# Patient Record
Sex: Male | Born: 1955 | Race: Black or African American | Hispanic: No | Smoking: Current every day smoker
Health system: Southern US, Community
[De-identification: ages and names within clinical notes are randomized; demographics above are authoritative.]

## PROBLEM LIST (undated history)

## (undated) DIAGNOSIS — K219 Gastro-esophageal reflux disease without esophagitis: Secondary | ICD-10-CM

## (undated) DIAGNOSIS — I509 Heart failure, unspecified: Secondary | ICD-10-CM

## (undated) DIAGNOSIS — J811 Chronic pulmonary edema: Secondary | ICD-10-CM

## (undated) DIAGNOSIS — I10 Essential (primary) hypertension: Secondary | ICD-10-CM

## (undated) DIAGNOSIS — G629 Polyneuropathy, unspecified: Secondary | ICD-10-CM

## (undated) DIAGNOSIS — K759 Inflammatory liver disease, unspecified: Secondary | ICD-10-CM

## (undated) DIAGNOSIS — Z992 Dependence on renal dialysis: Secondary | ICD-10-CM

## (undated) DIAGNOSIS — H269 Unspecified cataract: Secondary | ICD-10-CM

## (undated) DIAGNOSIS — I219 Acute myocardial infarction, unspecified: Secondary | ICD-10-CM

## (undated) DIAGNOSIS — N186 End stage renal disease: Secondary | ICD-10-CM

## (undated) DIAGNOSIS — K859 Acute pancreatitis without necrosis or infection, unspecified: Secondary | ICD-10-CM

## (undated) HISTORY — PX: DIALYSIS FISTULA CREATION: SHX611

## (undated) HISTORY — PX: COLONOSCOPY: SHX174

## (undated) HISTORY — PX: CHOLECYSTECTOMY: SHX55

---

## 2002-07-20 ENCOUNTER — Encounter: Payer: Self-pay | Admitting: Emergency Medicine

## 2002-07-20 ENCOUNTER — Inpatient Hospital Stay (HOSPITAL_COMMUNITY): Admission: EM | Admit: 2002-07-20 | Discharge: 2002-07-21 | Payer: Self-pay | Admitting: Emergency Medicine

## 2002-11-23 ENCOUNTER — Encounter: Payer: Self-pay | Admitting: Emergency Medicine

## 2002-11-23 ENCOUNTER — Emergency Department (HOSPITAL_COMMUNITY): Admission: EM | Admit: 2002-11-23 | Discharge: 2002-11-23 | Payer: Self-pay | Admitting: Emergency Medicine

## 2002-12-31 ENCOUNTER — Emergency Department (HOSPITAL_COMMUNITY): Admission: EM | Admit: 2002-12-31 | Discharge: 2002-12-31 | Payer: Self-pay | Admitting: *Deleted

## 2002-12-31 ENCOUNTER — Encounter: Payer: Self-pay | Admitting: Emergency Medicine

## 2003-03-02 ENCOUNTER — Emergency Department (HOSPITAL_COMMUNITY): Admission: EM | Admit: 2003-03-02 | Discharge: 2003-03-02 | Payer: Self-pay | Admitting: Emergency Medicine

## 2003-03-19 ENCOUNTER — Emergency Department (HOSPITAL_COMMUNITY): Admission: EM | Admit: 2003-03-19 | Discharge: 2003-03-20 | Payer: Self-pay | Admitting: Emergency Medicine

## 2003-03-21 ENCOUNTER — Emergency Department (HOSPITAL_COMMUNITY): Admission: EM | Admit: 2003-03-21 | Discharge: 2003-03-21 | Payer: Self-pay | Admitting: Emergency Medicine

## 2003-04-04 ENCOUNTER — Inpatient Hospital Stay (HOSPITAL_COMMUNITY): Admission: EM | Admit: 2003-04-04 | Discharge: 2003-04-07 | Payer: Self-pay | Admitting: Emergency Medicine

## 2003-04-06 ENCOUNTER — Encounter (INDEPENDENT_AMBULATORY_CARE_PROVIDER_SITE_OTHER): Payer: Self-pay | Admitting: Cardiology

## 2003-08-22 ENCOUNTER — Emergency Department (HOSPITAL_COMMUNITY): Admission: EM | Admit: 2003-08-22 | Discharge: 2003-08-22 | Payer: Self-pay | Admitting: Emergency Medicine

## 2003-11-26 ENCOUNTER — Emergency Department (HOSPITAL_COMMUNITY): Admission: EM | Admit: 2003-11-26 | Discharge: 2003-11-26 | Payer: Self-pay | Admitting: Emergency Medicine

## 2003-12-31 ENCOUNTER — Inpatient Hospital Stay (HOSPITAL_COMMUNITY): Admission: AD | Admit: 2003-12-31 | Discharge: 2004-01-05 | Payer: Self-pay | Admitting: Psychiatry

## 2004-08-12 ENCOUNTER — Emergency Department (HOSPITAL_COMMUNITY): Admission: EM | Admit: 2004-08-12 | Discharge: 2004-08-12 | Payer: Self-pay | Admitting: Emergency Medicine

## 2004-09-29 ENCOUNTER — Emergency Department (HOSPITAL_COMMUNITY): Admission: EM | Admit: 2004-09-29 | Discharge: 2004-09-30 | Payer: Self-pay | Admitting: Emergency Medicine

## 2004-10-04 ENCOUNTER — Emergency Department (HOSPITAL_COMMUNITY): Admission: EM | Admit: 2004-10-04 | Discharge: 2004-10-04 | Payer: Self-pay | Admitting: Emergency Medicine

## 2005-03-22 ENCOUNTER — Emergency Department (HOSPITAL_COMMUNITY): Admission: EM | Admit: 2005-03-22 | Discharge: 2005-03-22 | Payer: Self-pay | Admitting: Emergency Medicine

## 2005-04-11 ENCOUNTER — Emergency Department (HOSPITAL_COMMUNITY): Admission: EM | Admit: 2005-04-11 | Discharge: 2005-04-12 | Payer: Self-pay | Admitting: Emergency Medicine

## 2005-09-08 ENCOUNTER — Emergency Department (HOSPITAL_COMMUNITY): Admission: EM | Admit: 2005-09-08 | Discharge: 2005-09-08 | Payer: Self-pay | Admitting: *Deleted

## 2005-09-23 ENCOUNTER — Ambulatory Visit: Payer: Self-pay | Admitting: Cardiology

## 2005-09-23 ENCOUNTER — Ambulatory Visit: Payer: Self-pay | Admitting: Hospitalist

## 2005-09-23 ENCOUNTER — Inpatient Hospital Stay (HOSPITAL_COMMUNITY): Admission: EM | Admit: 2005-09-23 | Discharge: 2005-09-28 | Payer: Self-pay | Admitting: Emergency Medicine

## 2005-09-24 ENCOUNTER — Encounter: Payer: Self-pay | Admitting: Cardiology

## 2005-10-30 ENCOUNTER — Encounter (HOSPITAL_COMMUNITY): Admission: RE | Admit: 2005-10-30 | Discharge: 2005-11-13 | Payer: Self-pay | Admitting: Nephrology

## 2005-11-03 ENCOUNTER — Emergency Department (HOSPITAL_COMMUNITY): Admission: EM | Admit: 2005-11-03 | Discharge: 2005-11-03 | Payer: Self-pay | Admitting: Emergency Medicine

## 2005-11-03 ENCOUNTER — Ambulatory Visit: Payer: Self-pay | Admitting: Internal Medicine

## 2005-11-17 ENCOUNTER — Ambulatory Visit (HOSPITAL_COMMUNITY): Admission: RE | Admit: 2005-11-17 | Discharge: 2005-11-17 | Payer: Self-pay | Admitting: Vascular Surgery

## 2005-11-29 ENCOUNTER — Emergency Department (HOSPITAL_COMMUNITY): Admission: EM | Admit: 2005-11-29 | Discharge: 2005-11-30 | Payer: Self-pay | Admitting: Emergency Medicine

## 2005-12-22 ENCOUNTER — Encounter: Payer: Self-pay | Admitting: Internal Medicine

## 2005-12-22 ENCOUNTER — Ambulatory Visit: Payer: Self-pay | Admitting: Internal Medicine

## 2005-12-22 ENCOUNTER — Inpatient Hospital Stay (HOSPITAL_COMMUNITY): Admission: EM | Admit: 2005-12-22 | Discharge: 2005-12-27 | Payer: Self-pay | Admitting: *Deleted

## 2006-03-06 ENCOUNTER — Inpatient Hospital Stay (HOSPITAL_COMMUNITY): Admission: EM | Admit: 2006-03-06 | Discharge: 2006-03-14 | Payer: Self-pay | Admitting: Emergency Medicine

## 2006-03-06 ENCOUNTER — Ambulatory Visit: Payer: Self-pay | Admitting: Cardiology

## 2006-05-01 ENCOUNTER — Observation Stay (HOSPITAL_COMMUNITY): Admission: EM | Admit: 2006-05-01 | Discharge: 2006-05-01 | Payer: Self-pay | Admitting: Emergency Medicine

## 2006-05-16 ENCOUNTER — Emergency Department (HOSPITAL_COMMUNITY): Admission: EM | Admit: 2006-05-16 | Discharge: 2006-05-16 | Payer: Self-pay | Admitting: Emergency Medicine

## 2006-05-23 ENCOUNTER — Inpatient Hospital Stay (HOSPITAL_COMMUNITY): Admission: EM | Admit: 2006-05-23 | Discharge: 2006-05-25 | Payer: Self-pay | Admitting: Emergency Medicine

## 2006-06-28 ENCOUNTER — Encounter: Admission: RE | Admit: 2006-06-28 | Discharge: 2006-06-28 | Payer: Self-pay | Admitting: Nephrology

## 2006-06-29 ENCOUNTER — Emergency Department (HOSPITAL_COMMUNITY): Admission: EM | Admit: 2006-06-29 | Discharge: 2006-06-30 | Payer: Self-pay | Admitting: Emergency Medicine

## 2006-08-16 ENCOUNTER — Ambulatory Visit: Payer: Self-pay | Admitting: Internal Medicine

## 2006-08-16 ENCOUNTER — Observation Stay (HOSPITAL_COMMUNITY): Admission: EM | Admit: 2006-08-16 | Discharge: 2006-08-17 | Payer: Self-pay | Admitting: Emergency Medicine

## 2006-08-16 ENCOUNTER — Encounter: Payer: Self-pay | Admitting: Internal Medicine

## 2007-10-03 ENCOUNTER — Emergency Department (HOSPITAL_COMMUNITY): Admission: EM | Admit: 2007-10-03 | Discharge: 2007-10-04 | Payer: Self-pay | Admitting: Emergency Medicine

## 2008-09-20 ENCOUNTER — Emergency Department (HOSPITAL_COMMUNITY): Admission: EM | Admit: 2008-09-20 | Discharge: 2008-09-21 | Payer: Self-pay | Admitting: Emergency Medicine

## 2008-10-04 ENCOUNTER — Emergency Department (HOSPITAL_COMMUNITY): Admission: EM | Admit: 2008-10-04 | Discharge: 2008-10-04 | Payer: Self-pay | Admitting: Emergency Medicine

## 2008-10-21 ENCOUNTER — Inpatient Hospital Stay (HOSPITAL_COMMUNITY): Admission: EM | Admit: 2008-10-21 | Discharge: 2008-10-23 | Payer: Self-pay | Admitting: Emergency Medicine

## 2009-07-15 ENCOUNTER — Emergency Department (HOSPITAL_COMMUNITY): Admission: EM | Admit: 2009-07-15 | Discharge: 2009-07-15 | Payer: Self-pay | Admitting: Emergency Medicine

## 2009-10-24 ENCOUNTER — Inpatient Hospital Stay (HOSPITAL_COMMUNITY): Admission: EM | Admit: 2009-10-24 | Discharge: 2009-10-25 | Payer: Self-pay | Admitting: Emergency Medicine

## 2010-02-09 ENCOUNTER — Inpatient Hospital Stay (HOSPITAL_COMMUNITY): Admission: EM | Admit: 2010-02-09 | Discharge: 2010-02-10 | Payer: Self-pay | Admitting: Emergency Medicine

## 2010-02-14 ENCOUNTER — Emergency Department (HOSPITAL_COMMUNITY): Admission: EM | Admit: 2010-02-14 | Discharge: 2010-02-14 | Payer: Self-pay | Admitting: Emergency Medicine

## 2010-06-05 ENCOUNTER — Encounter: Payer: Self-pay | Admitting: Nephrology

## 2010-06-06 ENCOUNTER — Encounter: Payer: Self-pay | Admitting: Nephrology

## 2010-07-27 LAB — DIFFERENTIAL
Basophils Absolute: 0.1 10*3/uL (ref 0.0–0.1)
Eosinophils Relative: 9 % — ABNORMAL HIGH (ref 0–5)
Lymphocytes Relative: 10 % — ABNORMAL LOW (ref 12–46)
Lymphs Abs: 1.3 10*3/uL (ref 0.7–4.0)
Monocytes Absolute: 1.2 10*3/uL — ABNORMAL HIGH (ref 0.1–1.0)
Neutro Abs: 8.4 10*3/uL — ABNORMAL HIGH (ref 1.7–7.7)

## 2010-07-27 LAB — BASIC METABOLIC PANEL
CO2: 25 mEq/L (ref 19–32)
Creatinine, Ser: 14.05 mg/dL — ABNORMAL HIGH (ref 0.4–1.5)
GFR calc Af Amer: 4 mL/min — ABNORMAL LOW (ref >60)
GFR calc non Af Amer: 4 mL/min — ABNORMAL LOW (ref >60)
Glucose, Bld: 106 mg/dL — ABNORMAL HIGH (ref 70–99)
Potassium: 5 mEq/L (ref 3.5–5.1)

## 2010-07-27 LAB — CBC
HCT: 29.9 % — ABNORMAL LOW (ref 39.0–52.0)
MCV: 86.9 fL (ref 78.0–100.0)
RBC: 3.44 MIL/uL — ABNORMAL LOW (ref 4.22–5.81)
WBC: 12.1 10*3/uL — ABNORMAL HIGH (ref 4.0–10.5)

## 2010-07-28 LAB — BASIC METABOLIC PANEL
BUN: 70 mg/dL — ABNORMAL HIGH (ref 6–23)
CO2: 25 mEq/L (ref 19–32)
Chloride: 100 mEq/L (ref 96–112)
Chloride: 97 mEq/L (ref 96–112)
GFR calc Af Amer: 4 mL/min — ABNORMAL LOW (ref >60)
GFR calc Af Amer: 5 mL/min — ABNORMAL LOW (ref >60)
GFR calc non Af Amer: 4 mL/min — ABNORMAL LOW (ref >60)
Potassium: 4.4 mEq/L (ref 3.5–5.1)
Potassium: 4.4 mEq/L (ref 3.5–5.1)
Sodium: 137 mEq/L (ref 135–145)

## 2010-07-28 LAB — RAPID URINE DRUG SCREEN, HOSP PERFORMED
Barbiturates: NOT DETECTED
Benzodiazepines: NOT DETECTED

## 2010-07-28 LAB — URINALYSIS, ROUTINE W REFLEX MICROSCOPIC
Bilirubin Urine: NEGATIVE
Glucose, UA: 100 mg/dL — AB
Ketones, ur: NEGATIVE mg/dL
Leukocytes, UA: NEGATIVE
Nitrite: NEGATIVE
Specific Gravity, Urine: 1.013 (ref 1.005–1.030)
pH: 8.5 — ABNORMAL HIGH (ref 5.0–8.0)

## 2010-07-28 LAB — CBC
HCT: 33.3 % — ABNORMAL LOW (ref 39.0–52.0)
HCT: 33.9 % — ABNORMAL LOW (ref 39.0–52.0)
Hemoglobin: 11.4 g/dL — ABNORMAL LOW (ref 13.0–17.0)
MCH: 29.6 pg (ref 26.0–34.0)
MCV: 86.5 fL (ref 78.0–100.0)
Platelets: 103 10*3/uL — ABNORMAL LOW (ref 150–400)
RBC: 3.85 MIL/uL — ABNORMAL LOW (ref 4.22–5.81)
RBC: 3.96 MIL/uL — ABNORMAL LOW (ref 4.22–5.81)
RDW: 15.7 % — ABNORMAL HIGH (ref 11.5–15.5)
WBC: 10.6 10*3/uL — ABNORMAL HIGH (ref 4.0–10.5)
WBC: 10.7 10*3/uL — ABNORMAL HIGH (ref 4.0–10.5)

## 2010-07-28 LAB — URINE MICROSCOPIC-ADD ON

## 2010-07-28 LAB — GLUCOSE, CAPILLARY
Glucose-Capillary: 110 mg/dL — ABNORMAL HIGH (ref 70–99)
Glucose-Capillary: 140 mg/dL — ABNORMAL HIGH (ref 70–99)
Glucose-Capillary: 148 mg/dL — ABNORMAL HIGH (ref 70–99)
Glucose-Capillary: 91 mg/dL (ref 70–99)

## 2010-07-28 LAB — DIFFERENTIAL
Eosinophils Absolute: 0.7 10*3/uL (ref 0.0–0.7)
Eosinophils Relative: 6 % — ABNORMAL HIGH (ref 0–5)
Lymphocytes Relative: 8 % — ABNORMAL LOW (ref 12–46)
Lymphs Abs: 0.8 10*3/uL (ref 0.7–4.0)
Monocytes Absolute: 1 10*3/uL (ref 0.1–1.0)
Monocytes Relative: 10 % (ref 3–12)

## 2010-07-28 LAB — ETHANOL: Alcohol, Ethyl (B): <5 mg/dL (ref 0–10)

## 2010-07-28 LAB — PROTIME-INR: Prothrombin Time: 12.6 seconds (ref 11.6–15.2)

## 2010-07-28 LAB — MRSA PCR SCREENING: MRSA by PCR: NEGATIVE

## 2010-08-01 LAB — CBC
MCHC: 33.5 g/dL (ref 30.0–36.0)
MCV: 98.3 fL (ref 78.0–100.0)
Platelets: 127 10*3/uL — ABNORMAL LOW (ref 150–400)

## 2010-08-01 LAB — DIFFERENTIAL
Basophils Relative: 2 % — ABNORMAL HIGH (ref 0–1)
Eosinophils Absolute: 0.4 10*3/uL (ref 0.0–0.7)
Neutrophils Relative %: 66 % (ref 43–77)

## 2010-08-01 LAB — COMPREHENSIVE METABOLIC PANEL
AST: 22 U/L (ref 0–37)
Albumin: 3 g/dL — ABNORMAL LOW (ref 3.5–5.2)
Calcium: 10.1 mg/dL (ref 8.4–10.5)
Creatinine, Ser: 11.09 mg/dL — ABNORMAL HIGH (ref 0.4–1.5)
GFR calc Af Amer: 6 mL/min — ABNORMAL LOW (ref >60)
Total Protein: 7.2 g/dL (ref 6.0–8.3)

## 2010-08-01 LAB — LIPASE, BLOOD: Lipase: 48 U/L (ref 11–59)

## 2010-08-22 LAB — CBC
HCT: 28.5 % — ABNORMAL LOW (ref 39.0–52.0)
Hemoglobin: 9.7 g/dL — ABNORMAL LOW (ref 13.0–17.0)
Hemoglobin: 9.7 g/dL — ABNORMAL LOW (ref 13.0–17.0)
MCHC: 34 g/dL (ref 30.0–36.0)
MCHC: 34.3 g/dL (ref 30.0–36.0)
MCV: 94.1 fL (ref 78.0–100.0)
MCV: 95.2 fL (ref 78.0–100.0)
MCV: 96.6 fL (ref 78.0–100.0)
Platelets: 140 10*3/uL — ABNORMAL LOW (ref 150–400)
Platelets: 187 10*3/uL (ref 150–400)
RBC: 2.96 MIL/uL — ABNORMAL LOW (ref 4.22–5.81)
RBC: 3 MIL/uL — ABNORMAL LOW (ref 4.22–5.81)
RDW: 15.2 % (ref 11.5–15.5)
WBC: 6.8 10*3/uL (ref 4.0–10.5)
WBC: 6.9 10*3/uL (ref 4.0–10.5)

## 2010-08-22 LAB — POCT I-STAT 3, ART BLOOD GAS (G3+)
Acid-Base Excess: 3 mmol/L — ABNORMAL HIGH (ref 0.0–2.0)
Bicarbonate: 27.1 mEq/L — ABNORMAL HIGH (ref 20.0–24.0)
O2 Saturation: 93 %
Patient temperature: 97.6
TCO2: 28 mmol/L (ref 0–100)
pCO2 arterial: 38.3 mmHg (ref 35.0–45.0)
pH, Arterial: 7.457 — ABNORMAL HIGH (ref 7.350–7.450)
pO2, Arterial: 60 mmHg — ABNORMAL LOW (ref 80.0–100.0)

## 2010-08-22 LAB — GLUCOSE, CAPILLARY
Glucose-Capillary: 101 mg/dL — ABNORMAL HIGH (ref 70–99)
Glucose-Capillary: 102 mg/dL — ABNORMAL HIGH (ref 70–99)
Glucose-Capillary: 103 mg/dL — ABNORMAL HIGH (ref 70–99)
Glucose-Capillary: 125 mg/dL — ABNORMAL HIGH (ref 70–99)
Glucose-Capillary: 43 mg/dL — ABNORMAL LOW (ref 70–99)
Glucose-Capillary: 59 mg/dL — ABNORMAL LOW (ref 70–99)
Glucose-Capillary: 72 mg/dL (ref 70–99)
Glucose-Capillary: 75 mg/dL (ref 70–99)
Glucose-Capillary: 77 mg/dL (ref 70–99)
Glucose-Capillary: 87 mg/dL (ref 70–99)
Glucose-Capillary: 95 mg/dL (ref 70–99)

## 2010-08-22 LAB — PROTIME-INR
INR: 1 (ref 0.00–1.49)
Prothrombin Time: 13.6 seconds (ref 11.6–15.2)

## 2010-08-22 LAB — RENAL FUNCTION PANEL
Albumin: 2.8 g/dL — ABNORMAL LOW (ref 3.5–5.2)
BUN: 52 mg/dL — ABNORMAL HIGH (ref 6–23)
Calcium: 8.5 mg/dL (ref 8.4–10.5)
Calcium: 8.6 mg/dL (ref 8.4–10.5)
Creatinine, Ser: 4.06 mg/dL — ABNORMAL HIGH (ref 0.4–1.5)
Creatinine, Ser: 8.67 mg/dL — ABNORMAL HIGH (ref 0.4–1.5)
GFR calc Af Amer: 19 mL/min — ABNORMAL LOW (ref >60)
GFR calc non Af Amer: 16 mL/min — ABNORMAL LOW (ref >60)
Glucose, Bld: 125 mg/dL — ABNORMAL HIGH (ref 70–99)
Phosphorus: 2.1 mg/dL — ABNORMAL LOW (ref 2.3–4.6)
Phosphorus: 2.8 mg/dL (ref 2.3–4.6)
Potassium: 3.9 mEq/L (ref 3.5–5.1)
Sodium: 137 mEq/L (ref 135–145)

## 2010-08-22 LAB — POCT CARDIAC MARKERS
CKMB, poc: 1.5 ng/mL (ref 1.0–8.0)
Myoglobin, poc: 366 ng/mL (ref 12–200)
Troponin i, poc: <0.05 ng/mL (ref 0.00–0.09)

## 2010-08-22 LAB — BASIC METABOLIC PANEL WITH GFR
CO2: 29 meq/L (ref 19–32)
Calcium: 8.4 mg/dL (ref 8.4–10.5)
GFR calc Af Amer: 11 mL/min — ABNORMAL LOW (ref >60)
Potassium: 3.6 meq/L (ref 3.5–5.1)
Sodium: 135 meq/L (ref 135–145)

## 2010-08-22 LAB — BASIC METABOLIC PANEL
BUN: 30 mg/dL — ABNORMAL HIGH (ref 6–23)
BUN: 34 mg/dL — ABNORMAL HIGH (ref 6–23)
Chloride: 100 mEq/L (ref 96–112)
Chloride: 98 mEq/L (ref 96–112)
Creatinine, Ser: 6.25 mg/dL — ABNORMAL HIGH (ref 0.4–1.5)
Creatinine, Ser: 8.1 mg/dL — ABNORMAL HIGH (ref 0.4–1.5)
GFR calc non Af Amer: 7 mL/min — ABNORMAL LOW (ref >60)
GFR calc non Af Amer: 9 mL/min — ABNORMAL LOW (ref >60)
Glucose, Bld: 55 mg/dL — ABNORMAL LOW (ref 70–99)

## 2010-08-22 LAB — DRUG SCREEN PANEL (SERUM)
Amphetamine Scrn: NEGATIVE
Barbiturate Scrn: NEGATIVE
Cannabinoid, Blood: NEGATIVE
Phencyclidine, Serum: NEGATIVE
Propoxyphene,Serum: NEGATIVE

## 2010-08-22 LAB — DIFFERENTIAL
Basophils Absolute: 0.1 10*3/uL (ref 0.0–0.1)
Eosinophils Absolute: 0.3 10*3/uL (ref 0.0–0.7)
Lymphs Abs: 1.2 10*3/uL (ref 0.7–4.0)
Neutrophils Relative %: 65 % (ref 43–77)

## 2010-08-22 LAB — BRAIN NATRIURETIC PEPTIDE: Pro B Natriuretic peptide (BNP): 2466 pg/mL — ABNORMAL HIGH (ref 0.0–100.0)

## 2010-08-23 LAB — CULTURE, BLOOD (ROUTINE X 2)

## 2010-08-23 LAB — CBC
HCT: 31.4 % — ABNORMAL LOW (ref 39.0–52.0)
Hemoglobin: 10.9 g/dL — ABNORMAL LOW (ref 13.0–17.0)
MCV: 95.6 fL (ref 78.0–100.0)
Platelets: 138 10*3/uL — ABNORMAL LOW (ref 150–400)
RDW: 13.8 % (ref 11.5–15.5)

## 2010-08-23 LAB — DIFFERENTIAL
Basophils Absolute: 0 10*3/uL (ref 0.0–0.1)
Eosinophils Absolute: 0.5 10*3/uL (ref 0.0–0.7)
Eosinophils Relative: 4 % (ref 0–5)
Lymphs Abs: 1.5 10*3/uL (ref 0.7–4.0)
Monocytes Absolute: 1.5 10*3/uL — ABNORMAL HIGH (ref 0.1–1.0)

## 2010-08-23 LAB — POCT I-STAT, CHEM 8
BUN: 49 mg/dL — ABNORMAL HIGH (ref 6–23)
Chloride: 103 mEq/L (ref 96–112)
Creatinine, Ser: 11.3 mg/dL — ABNORMAL HIGH (ref 0.4–1.5)
Sodium: 135 mEq/L (ref 135–145)
TCO2: 26 mmol/L (ref 0–100)

## 2010-09-27 NOTE — H&P (Signed)
[name], [name]              ACCOUNT NO.:  [id number]   MEDICAL RECORD NO.:  [id number]          PATIENT TYPE:  INP   LOCATION:  6732                         FACILITY:  [facility]   PHYSICIAN:  Melissa L. Ladona Ridgel, MD  DATE OF BIRTH:  1955-07-12   DATE OF ADMISSION:  10/21/2008  DATE OF DISCHARGE:                              HISTORY & PHYSICAL   CHIEF COMPLAINT:  I can't breathe yet.   PRIMARY CARE PHYSICIAN:  Dr. Briant Cedar.   HISTORY OF PRESENT ILLNESS:  This is a 55 year old African American male  who for the past 2 weeks has been having increasing shortness of breath,  not able to lay flat.  He has been sitting up to sleep.  He has had  dialysis and has had no relief of his symptoms.  The patient stopped  smoking approximately 2 weeks ago, and he is now puzzled by the fact  that he cannot breathe.  The patient complains of chest pain with cough  with deep inspiration.  The patient complains of diarrhea for a couple  times, now improved and he does make some urine.  He has had no fever or  chills.  He had some black stool a couple weeks ago, but that has  improved.   REVIEW OF SYSTEMS:  CONSTITUTIONAL:  He stated his weight has been the  same.  He has had no fever or chills.  EYES:  He has had a little bit of blurry vision, but no double vision.  EARS/NOSE/THROAT:  No tinnitus, dysphagia or discharge.  CARDIOVASCULAR:  Intermittent chest pain which is not present at this  time.  RESPIRATORY:  Positive shortness of breath with PND and orthopnea.  GI:  He reports the possibility of some black stools in the past.  GU:  No hesitancy, frequency or dysuria.  He does make urine.  He is  currently a dialysis patient for the last 3 years.  NEUROLOGIC:  He has no headaches and no seizure disorder.  HEMATOLOGIC:  No bleeding disorder.  INTEGUMENTARY:  No rashes.  PSYCHIATRIC:  No depression or anxiety.   PAST MEDICAL HISTORY:  Renal disease, hypertension and a dialysis  patient.  He  is diabetic.   PAST SURGICAL HISTORY:  AV fistula.   SOCIAL HISTORY:  He quit tobacco 2 weeks ago.  He does not drink any  alcohol.  His contact person will be his mother, Para March at [phone].  Please note that the patient's mom is also a dialysis patient.   ALLERGIES:  NO KNOWN DRUG ALLERGIES.   CURRENT MEDICATIONS:  1. Coreg 25 mg twice daily.  2. Lisinopril 40 mg twice daily.  3. Glyburide 5 mg once daily.  4. Lisinopril 20 mg at bedtime.   FAMILY HISTORY:  Mom has end-stage renal disease and goes to the same  dialysis center that he does.  Dad is living with good health.   PHYSICAL EXAMINATION:  VITAL SIGNS:  Temperature is 97.9, blood pressure  179/86, pulse 61, respirations 16.  Saturation 98% on 2 liters.  GENERAL:  A pleasant African American male who is unable to lay  flat  secondary to sensation of dyspnea.  He has no acute distress or use of  accessory muscles in the upright position.  HEENT:  He is normocephalic, atraumatic.  Pupils equal, round and  reactive to light.  He has anicteric sclerae.  Extraocular muscles  appear to be intact.  Examination of the ears reveal tympanic membranes  to be intact bilaterally.  There is no external lesions, no discharge.  Examination of the nose; septum midline without any discharge.  Turbinates are intact.  Examination of the mouth; he has dentures with  no oral lesions or lip lesions.  NECK:  Supple.  There is no JVD, no lymph nodes, no carotid bruits.  CHEST:  Clear to auscultation.  There is no rhonchi, rales or wheezes.  CARDIOVASCULAR:  Regular rate and rhythm.  Positive S1-S2.  No S3-S4.  No murmurs, rubs or gallops.  ABDOMEN:  Obese, nontender, nondistended with positive bowel sounds.  The patient has no hepatosplenomegaly, no guarding, no rebound.  EXTREMITIES:  The left arm has a AV fistula.  There is no clubbing,  cyanosis or edema.  His fistula has a palpable thrill.  NEUROLOGIC:  He is tired, but awake, alert and  oriented.  Cranial nerves  II-XII are intact.  Power appears to be 4+/5 symmetrically.  DTRs are  2+.  Toes are downgoing.  PSYCHIATRIC:  Affect is appropriate.  Recent and remote memory is  intact.  Judgment is intact.   PERTINENT LABORATORIES:  His white count is 6.8 with a hemoglobin of  9.7, hematocrit 29.2 and platelets of 187.  His first set of cardiac  markers is negative for a troponin less than 0.05, but his myoglobin is  slightly elevated at 366.  Sodium is 138, potassium 4.0, chloride 100,  CO2 is 30, glucose is 100, BUN 34, creatinine is 8.1, calcium is 8.9.  PT is 13.6 with an INR of 1.0, PTT 33.  BNP was 2466.  Chest x-ray shows  cardiac enlargement, bilateral perihilar infiltrates, right greater than  left with a question of edema versus infection.  There is no pleural  effusion or pneumothorax.  He has mild fluid or thickening in the  fissure with no bony focal abnormalities.   EKG shows sinus rhythm with no ST-T wave changes.   Generally, this is a pleasant African American male unable to lay flat  secondary to sensation of dyspnea.  He is in no acute distress in the  upright position, but he does have PND and orthopnea for at least 2  weeks.  Chest x-ray shows a unilateral area of possible infiltrate  versus CHF.   1. Dyspnea, PND, orthopnea, bilateral infiltrates with right greater      than left, perihilar opacification, possibly representing a focal      congestive heart failure versus an infiltrate.  For now, we will      plan to dialyze the patient today in order to improve his      congestive heart failure type symptoms.  I will treat him with      Avelox IV once daily and I will schedule him for a CTA after      dialysis to look for PE.  2. Hypertension.  We will continue his Coreg b.i.d., Aleve p.r.n. and      labetalol.  We will continue his lisinopril 40 b.i.d. and 20 at      bedtime.  3. Diabetes.  We will continue his glyburide and sliding scale  insulin.  4. Pulmonary infiltrates versus CHF.  As stated, I will start him on      Avelox.  We will obtain a CT scan after      dialysis and I will treat him for PE.  Lovenox per pharmacy will be      requested.  5. End-stage renal disease.  The patient will be for dialysis today.      I confirmed this with Dr. Arrie Aran.   Total time on this case was from 6:15 a.m. to 8:30 a.m.      Melissa L. Ladona Ridgel, MD  Electronically Signed     MLT/MEDQ  D:  10/21/2008  T:  10/21/2008  Job:  [id number]   cc:   Dyke Maes, M.D.

## 2010-09-27 NOTE — Discharge Summary (Signed)
NAMEBRYN, [name]              ACCOUNT NO.:  [id number]   MEDICAL RECORD NO.:  [id number]          PATIENT TYPE:  INP   LOCATION:  6732                         FACILITY:  [facility]   PHYSICIAN:  Theodosia Paling, MD    DATE OF BIRTH:  1956-01-22   DATE OF ADMISSION:  10/20/2008  DATE OF DISCHARGE:  10/23/2008                               DISCHARGE SUMMARY   PRIMARY CARE PHYSICIAN:  Dyke Maes, MD   ADMITTING HISTORY:  Please refer to the excellent admission note  dictated by Dr. Derenda Mis under history of present illness.   DISCHARGE DIAGNOSIS:  Shortness of breath most likely due to volume  overload.   SECONDARY DIAGNOSES:  1. History of end-stage renal disease.  2. History of hypertension.  3. History of diabetes mellitus.   DISCHARGE MEDICATIONS:  1. New medication added hydralazine 50 mg p.o. q.8 h.  2. Azithromycin 500 mg p.o. daily for 3 days.   The patient is instructed to continue home medications which include:  1. Aspirin enteric-coated 81 mg p.o. daily.  2. Amlodipine 10 mg p.o. daily.  3. Coreg 25 mg p.o. q.12 h.  4. Lisinopril 40 mg p.o. q.12 h.  5. Glyburide 0.5 mg p.o. daily.   HOSPITAL COURSE:  Following issues were addressed during the  hospitalization.  1. Shortness of breath.  The patient was primarily admitted with      shortness of breath.  His chest x-ray was consistent with volume      overload.  His symptoms significantly got better after getting      hemodialysis.  He does have persistent infiltrate on his chest x-      ray, however, I think we got more radiological delay of resolving      pneumonia than actually pneumonia.  The patient has already gotten      antibiotics in the past.  During the hospitalization, he did get IV      antibiotics.  We are going to discharge the patient with 3 more      days of azithromycin.  He will follow up with Dr. Briant Cedar within      1 week time for further evaluation and management.  2.  End-stage renal dialysis.  Follow up outpatient with Dr. Briant Cedar      for hemodialysis.  3. Hypertension.  I have included hydralazine 50 q.8 h. given his high      blood pressure.  He is going to follow up with Dr. Briant Cedar as an      outpatient.  Management of hypertension can further be carried out.  4. Diabetes mellitus.  The patient will continue his home medication.   CONSULTATIONS:  [facility] on October 21, 2008 for volume  overload.   PROCEDURES PERFORMED:  Hemodialysis twice.   IMAGING PERFORMED:  CTA of the chest showing:  1. Prominent mediastinal and hilar lymph node hyperplastic in the      region.  2. Bilateral upper lobe infiltrate.  3. Small right pleural effusion and overlying atelectasis.   DISPOSITION:  The patient is going home and will follow  up with Dr.  Briant Cedar within 1 week's time for further management of the end-stage  renal disease and management of his hypertension.   TOTAL TIME SPENT:  In discharge of this patient is around 1 hour.      Theodosia Paling, MD  Electronically Signed     NP/MEDQ  D:  10/23/2008  T:  10/24/2008  Job:  [id number]   cc:   Dyke Maes, M.D.
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
## 2010-09-30 NOTE — H&P (Signed)
NAMEGAVIN, [name]              ACCOUNT NO.:  [id number]   MEDICAL RECORD NO.:  [id number]          PATIENT TYPE:  INP   LOCATION:  5501                         FACILITY:  [facility]   PHYSICIAN:  Garnetta Buddy, M.D.   DATE OF BIRTH:  January 03, 1956   DATE OF ADMISSION:  05/23/2006  DATE OF DISCHARGE:                              HISTORY & PHYSICAL   TIME SEEN:  11 p.m.   This is a 55 year old African American male with a three-day history of  chest pain.  He has end-stage renal disease, Tuesday, Thursday, Saturday  at [employer].  His dry weight is set at 77 kilograms, he left 1.7  kilograms above his dry weight with large fluid gains and cramps towards  the end of his treatment.  He presented to the emergency room with  dyspnea on exercise, minimal activity, chest discomfort, chest wall  without radiation, constant dull pressure, right and left lung fields,  relieved by oxygen, worse when lying flat.  No cough with sputum  production.  No sinus congestion or runny nose.  Denies any chills.   PAST MEDICAL HISTORY:  1. Hypertension.  2. End-stage renal disease.  3. Diabetes mellitus type 2.  4. History of hepatitis C and cirrhosis.  5. History of polysubstance abuse.  6. History of depression.   MEDICATIONS:  1. Norvasc 10 mg daily.  2. Tums 2 tablets q.a.c.  3. Aspirin 81 mg daily.   ALLERGIES:  NO KNOWN DRUG ALLERGIES.   REVIEW OF SYSTEMS:  GENERAL:  Denies any fevers, sweats or chills.  Admits to weakness and malaise.  EYES:  No visual loss.  No double  vision or eye pain.  EARS/NOSE/MOUTH/THROAT:  No epistaxis or sore  throat.  CARDIOVASCULAR:  Positive for chest pain.  Positive for  orthopnea.  No syncope.  Last cath October of 2007, negative.  RESPIRATORY SYSTEM:  Positive for dyspnea.  Mild cough, nonproductive.  ABDOMINAL SYSTEM:  No pain, nausea, vomiting or diarrhea.  UROGENITAL:  No dysuria, urgency or frequency.  NEUROLOGIC:  No diplopia, dysarthria  or dysphasia.   No paresthesias.  MUSCULOSKELETAL:  No joint pain, joint  swelling or muscle weakness.  ENDOCRINE:  Positive for diabetes.  Negative for thyroid or adrenal disease.  DERMATOLOGIC:  No skin rashes  or lesions.  HEMATOLOGIC:  Negative for DVT, pulmonary embolus or  bleeding.   SOCIAL HISTORY:  No tobacco.  No alcohol.   FAMILY HISTORY:  Mother and uncle with end-stage renal disease.   PHYSICAL EXAMINATION:  GENERAL:  Alert and pleasant gentleman.  VITAL SIGNS:  Temperature 99.4.  Pulse 85.  Blood pressure 170/80.  HEENT:  Head and eyes:  PERRLA.  Normocephalic, atraumatic.  Ears/Nose/Mouth/Throat:  His tympanic membranes are normal.  Mucous  membranes normal.  Oropharynx is clear.  NECK:  Supple.  JVP mildly elevated.  No bruits.  CARDIOVASCULAR:  Regular rate and rhythm.  Systolic murmur 2/6.  No  heaves, thrills, rubs or gallops.  RESPIRATORY:  Lungs fields have diminished air entry at the bases.  No  crackles or wheezes.  ABDOMEN:  Soft, nontender.  Bowel sounds present.  EXTREMITIES:  No clubbing, cyanosis or edema.  AV fistula thrill and  bruit.  DERMATOLOGIC:  No lesions noted.  NEUROLOGIC:  Alert and oriented.  Nonfocal.   LABS:  Troponin less than 0.05.  Hemoglobin 15.6, WBC 14.6, platelets  222, potassium 3.7, glucose 128.  Chest x-ray pending.  EKG normal sinus  rhythm, inferolateral ischemia, LVH per voltage.   ASSESSMENT/PLAN:  1. End-stage renal disease.  Hemodialysis __________ some dialysis      mild volume excess.  2. Rule out myocardial infarction, we will check troponin x3.  3. Suspicious clinically for pneumonia.  We will cover with Avalox 400      mg IV q.24 hours.  We will rule out pulmonary embolus with chest      CT.  4. Pulse oximetry continuously on oxygen nasal cannula 2 liters.  5. Anemia.  Stable.  6. Hypertension.  Norvasc 10 mg q.h.s.      Garnetta Buddy, M.D.  Electronically Signed     MWW/MEDQ  D:  05/23/2006  T:  05/24/2006  Job:   [id number]

## 2010-09-30 NOTE — Consult Note (Signed)
NAME:  [name], [name]                        ACCOUNT NO.:  [id number]   MEDICAL RECORD NO.:  [id number]                   PATIENT TYPE:  IPS   LOCATION:  0505                                 FACILITY:  [facility]   PHYSICIAN:  Sherin Quarry, MD                   DATE OF BIRTH:  1955/05/26   DATE OF CONSULTATION:  01/04/2004  DATE OF DISCHARGE:                                   CONSULTATION   HISTORY OF PRESENT ILLNESS:  [name] is a 55 year old homeless man  who has been admitted to [facility] on several occasions for  problems related to alcohol abuse and crack cocaine intoxication.  His most  recent hospitalization was in November of last year when he presented with  diffuse swelling which was found to be secondary to anasarca from cirrhosis  of the liver.  The patient states that he has a history of diabetes that  dates to 16.  He states that his usual treatment for diabetes consists of  NPH insulin 40 units each a.m. and 20 units each p.m.  He states that he was  taking insulin until 2 months ago when he ran out of medications and has not  taken any treatment for his diabetes since that time.  Review of the  hospital transcriptions suggests that at least until recently he was treated  with oral hypoglycemic agents.  He initially presented on August 18 with  complaints of leg swelling and reported suicidal ideation.  He was therefore  admitted involuntarily to [facility].  Since his admission he has  been maintained on Glipizide 5 mg daily and hydrochlorothiazide 25 mg daily,  as well as Symmetrel, Cymbalta, Librium, Seroquel, and Deseryl.  Blood  sugars have been in the range of 350 to 500.  We are asked to see him to  provide advice for regulating his blood sugar.   PAST MEDICAL HISTORY:  Medical illnesses:  1. 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  2. Alcohol abuse.  3. Diabetes.  4. Tobacco abuse.  5. History of crack cocaine abuse and IV  drug abuse.   MEDICATIONS:  These have probably consisted of insulin as described above,  Aldactone 50 mg b.i.d., and Lasix 40 mg daily, although it seems likely that  he has not taken any medication for the last 2 months.   </OPERATIONS>  He reports no operations.   FAMILY HISTORY:  Both his mother and uncle are receiving dialysis  treatments.  His mother and brother have diabetes.   SOCIAL HISTORY:  He smokes heavily, has a history of alcohol abuse, abuses  crack cocaine and IV cocaine.  He is currently homeless.   REVIEW OF SYSTEMS:  HEAD:  He denies headache or dizziness.  EYES:  He  denies visual blurring.  EARS, NOSE AND THROAT:  He denies earache, sinus  pain, or sore throat.  CHEST:  He has a mild cough.  CARDIOVASCULAR:  He  denies orthopnea, PND, or ankle edema.  GI:  Denies nausea and vomiting or  abdominal pain.  GU:  Denies dysuria or urinary frequency.  NEURO:  Denies  seizure or stroke.  ENDO:  Denies excessive thirst, urinary frequency or  nocturia.   PHYSICAL EXAMINATION:  On physical examination, he is a thin, alert man who  is reasonably cooperative.  HEENT:  Within normal limits.  CHEST:  Clear.  Examination of the back reveals no CVA or point tenderness.  CARDIOVASCULAR:  Normal S1, S2.  There are no rubs, murmurs or gallops.  ABDOMEN:  Soft, it is not distended.  There are no masses or organomegaly.  NEUROLOGIC:  Testing.  EXTREMITIES:  Normal.   IMPRESSION:  1. Uncontrolled diabetes.  2. History of anasarca secondary to cirrhosis of the liver.  3. Chronic alcohol abuse.  4. Chronic tobacco abuse.  5. Chronic cocaine abuse.   PLAN:  I think this patient needs to be on both short acting and long acting  insulin in order to achieve regulation of his blood sugars.  In addition, it  will be important to achieve better blood pressure control and I think this  should be accomplished by a combination of an ace inhibitor and diuretic.  Aldactone might be a  good choice except it is going to probably cause  problems with hyperkalemia along with ace inhibitor.  Managing this man's  diabetes on a long-term basis is going to be problematic with his inability  to obtain medications.                                               Sherin Quarry, MD    SY/MEDQ  D:  01/04/2004  T:  01/05/2004  Job:  [id number]   cc:   Josiah Lobo, M.D.
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
## 2010-09-30 NOTE — H&P (Signed)
NAMEARLANDER, [name]              ACCOUNT NO.:  [id number]   MEDICAL RECORD NO.:  [id number]          PATIENT TYPE:  INP   LOCATION:  5506                         FACILITY:  [facility]   PHYSICIAN:  Mindi Slicker. Lowell Guitar, M.D.  DATE OF BIRTH:  1955-06-14   DATE OF ADMISSION:  04/30/2006  DATE OF DISCHARGE:                              HISTORY & PHYSICAL   HISTORY OF PRESENT ILLNESS:  [name] is a 55 year old male who  started dialysis in late October after treatment for chronic kidney  disease, volume overload, acute MI and congestive heart failure.  The  patient receives hemodialysis at the [facility] on  Tuesday, Thursday and Saturday.  He presents now with a 3-day history of  chest pain, which has been somewhat positional, and bilateral flank  pain, with chest x-ray today showing evidence of mild pulmonary edema  with BNP of greater than 3000.  The patient is now admitted for  treatment.   PAST HISTORY:  Includes hypertension, diabetes mellitus, history of  polysubstance abuse, hepatitis C with cirrhosis, status post left upper  extremity AV fistula, depression with prior suicidal attempt, history of  left upper extremity AV fistula and anemia.   FAMILY HISTORY AND SOCIAL HISTORY:  As per October admitting notes.   CURRENT MEDICATIONS:  Include glimepiride, Seroquel, Os-Cal, Pravachol,  clonidine, Norvasc, aspirin, pindolol.   PHYSICAL EXAMINATION:  GENERAL:  The patient is an [place]  male, oriented in all spheres.  VITAL SIGNS:  Blood pressure is 203/97, repeat 176/82,  HEENT:  Poor dentition.  LUNGS:  Clear.  HEART:  Regular rhythm and rate.  No pericardial friction rub is  audible.  ABDOMEN:  Soft.  No CVA tenderness.  There is slight discomfort in the  low lumbar area.  EXTREMITIES:  Trace edema.   LABORATORY DATA:  Hemoglobin 13.9, white blood count 11,500, platelets  234,000.  Urinalysis:  21-50 red blood cells.  Glucose was 151.  CK 187.  Potassium 4.4.   EKG:  Normal sinus rhythm with an no anterior MI and left axis  deviation.   ASSESSMENT:  1. Congestive heart failure.  2. Chest pain, most likely musculoskeletal.  3. End-stage renal disease.  4. Flank pain, most likely musculoskeletal.  5. Microhematuria of uncertain etiology.   PLAN:  1. Rule out MI.  2. Hemodialysis in the morning.  3. Urine C&S.  4. May need urologic evaluation.      Mindi Slicker. Lowell Guitar, M.D.  Electronically Signed     ACP/MEDQ  D:  04/30/2006  T:  05/01/2006  Job:  [id number]
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
## 2010-09-30 NOTE — Cardiovascular Report (Signed)
NAMESENICA, CRALL              ACCOUNT NO.:  [id number]   MEDICAL RECORD NO.:  [id number]          PATIENT TYPE:  INP   LOCATION:  4728                         FACILITY:  [facility]   PHYSICIAN:  Arturo Morton. Riley Kill, MD, FACCDATE OF BIRTH:  1955-07-09   DATE OF PROCEDURE:  03/13/2006  DATE OF DISCHARGE:                              CARDIAC CATHETERIZATION   INDICATIONS:  Mr. Henningsen came in with volume overload.  He has advanced  chronic kidney disease with volume overload, and it was thought that he had  a possible non-Q-wave MI with a positive MB but negative troponin.  He had  an AV fistula placed in July 2007 and has a history of hypertension, with  multiple family members on dialysis.  The current study was done to assess  coronary anatomy.   PROCEDURE:  1. Left heart catheterization.  2. Selective coronary arteriography.  3. Selective left ventriculography.   DESCRIPTION OF THE PROCEDURE:  The patient brought to the catheterization  lab and prepped and draped in usual fashion. Through an anterior puncture,  the right femoral artery was easily entered.  A 5-French sheath was placed.  We then took views of the left coronary artery, but there was inadequate  opacification.  The right coronary artery was then injected, and then the 5-  [place] sheath was upgraded to a 6-French sheath, and we used a JL-4 guiding  catheter to better visualize the hypertrophied left coronary system.  Views  of the left coronary were then obtained in multiple angiographic  projections.  Central aortic and left ventricular pressures were measured  with the pigtail, and ventriculography was performed in the RAO projection.  The patient tolerated the procedure well, and there were no complications.  He was taken to the holding area in satisfactory clinical condition.   HEMODYNAMIC DATA.:  1. Central aortic pressure 133/65, mean 92.  2. Left ventricular pressure 133/12.  3. No gradient on pullback across  the aortic valve.   ANGIOGRAPHIC DATA:  1. Ventriculography was done in the RAO projection.  Overall systolic      function appeared to be preserved.  There did not appear to be      significant mitral regurgitation, and the aortic root did not appear to      be significantly enlarged.  2. The right coronary artery is a relatively smooth vessel, with only      minor irregularity.  There was no significant high-grade focal      stenosis.  The right coronary provided a major PDA and a smaller      posterolateral system.  3. The left main coronary artery is free of critical disease.  4. The left anterior descending artery courses to the apex.  There are two      diagonal branches which come off near one another, and a major septal      perforator, all of which appear to be smooth and without critical      narrowing.  The LAD bifurcates at the apex.  5. There is a ramus intermedius vessel that is small and appears to have  perhaps tapered narrowing of about 30% at the ostium, but his does not      appear to be high grade.  6. The circumflex system is moderately large, with a large marginal branch      that bifurcates in two posterolateral branches, both of which are free      of critical disease.   CONCLUSIONS:  1. Systemic hypertension.  2. End-stage renal disease.  3. Presentation with volume overload.  4. Well-preserved left ventricular systolic function.  5. No critical coronary stenosis.   DISPOSITION:  Medical therapy will be warranted.  The major issue at this  time is the patient's need for chronic dialysis.  Cardiac followup will be  with Dr. Antoine Poche.      Arturo Morton. Riley Kill, MD, [facility]  Electronically Signed     TDS/MEDQ  D:  03/13/2006  T:  03/13/2006  Job:  [id number]   cc:   Rollene Rotunda, MD, [facility]  Madolyn Frieze Jens Som, MD, [facility]  CV Laboratory
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
## 2010-09-30 NOTE — Discharge Summary (Signed)
NAME:  [name], [name]                        ACCOUNT NO.:  [id number]   MEDICAL RECORD NO.:  [id number]                   PATIENT TYPE:  INP   LOCATION:  0344                                 [facility]ACILITY[facility]  [facility]   PHYSICIAN:  Corinna L. Lendell Caprice, MD             DATE OF BIRTH:  03-May-1956   DATE OF ADMISSION:  04/04/2003  DATE OF DISCHARGE:  04/07/2003                                 DISCHARGE SUMMARY   DIAGNOSES:  1. Anasarca secondary to cirrhosis.  2. Alcohol abuse.  3. Diabetes.  4. Tobacco abuse.  5. History of crack cocaine abuse and past IV drug abuse.   DISCHARGE MEDICATIONS:  1. Lasix 40 mg p.o. daily.  2. Spironolactone 50 mg p.o. b.i.d.  3. Amaryl 2 mg p.o. daily.   DIET:  Low salt, diabetic.   FOLLOW UP:  Follow-up with [facility].   SPECIAL INSTRUCTIONS:  He was encouraged not to drink or use drugs.   ACTIVITY:  Ad lib.   DISCHARGE CONDITION:  Stable.   HISTORY AND HOSPITAL COURSE:  The patient is a 55 year old polysubstance  abuser who presented to the emergency room with complaints of swelling.  He  has a history of diabetes, but was non-compliant with his medication.  He  had sugars in the 300's and his albumin was only 1.3, presumably due to  liver disease.  His HIV was negative.  The patient was started on diuretics  and diabetes medications.  His anasarca improved tremendously. An  echocardiogram showed a good ejection fraction with mild mitral  regurgitation.  Chest x-ray showed no pneumonia.  CT of the abdomen and  pelvis showed anasarca without any abnormalities other than gallstones.  The  patient was very anxious to go and therefore was discharged to home with  continued outpatient treatment and follow-up at [facility].                                               Corinna L. Lendell Caprice, MD    CLS/MEDQ  D:  04/27/2003  T:  04/27/2003  Job:  [id number]
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
## 2010-09-30 NOTE — Discharge Summary (Signed)
[name], [name]              ACCOUNT NO.:  [id number]   MEDICAL RECORD NO.:  [id number]          PATIENT TYPE:  INP   LOCATION:  5501                         FACILITY:  [facility]   PHYSICIAN:  Garnetta Buddy, M.D.   DATE OF BIRTH:  1955-07-26   DATE OF ADMISSION:  05/23/2006  DATE OF DISCHARGE:  05/25/2006                               DISCHARGE SUMMARY   ADMISSION DIAGNOSES:  1. Atypical chest pain, rule out acute myocardial infarction.  2. End stage renal disease on chronic hemodialysis.  3. Uncontrolled hypertension.  4. Low grade fever with leukocytosis.  5. Iron deficiency anemia of chronic disease.  6. Secondary hyperparathyroidism.  7. History of substance abuse.  8. Hepatitis C.   DISCHARGE DIAGNOSIS:  1. Atypical chest pain secondary to early left lower lobe pneumonia      and possibly cocaine abuse with positive drug screen, negative for      acute myocardial infarction.  2. Left lower lobe pneumonia.  3. Ongoing cocaine abuse with positive drug screen.  4. Malignant hypertension.  5. Iron deficiency anemia.  6. Secondary hyperparathyroidism.  7. End stage renal disease on chronic hemodialysis.  8. Hepatitis C.   BRIEF HISTORY:  55 year old black male with a three day history of chest  pain.  He has end stage renal disease secondary to hypertension and type  2 diabetes mellitus, dialyzing every Tuesday, Thursday, and Saturday at  [facility].  He has a history of excessive weight gain and  severe cramping during dialysis.  He presents to the emergency room  complaining of dyspnea on exertion, chest discomfort without radiation,  constant chest pressure in both right and left lung fields, he feels  better with oxygen.  He states his symptoms are worse when he lies flat.  He denies cough, sinus congestion, respiratory infection, fever, or  chills.   LABS ON ADMISSION:  White count 14,600, hemoglobin 15.6, platelets  222,000.  Sodium 133, potassium  3.7, chloride 103, glucose 128, BUN 47,  creatinine 6.99. CK-MB 5, troponin less than 0.05.  Chest x-ray showed  mild bronchiectasias with tiny left pleural effusion, borderline  cardiomegaly, no edema. EKG showed normal sinus rhythm, right atrial  enlargement, left ventricular hypertrophy by voltage, mild inferior ST  depression.   HOSPITAL COURSE:  Serial cardiac enzymes were negative for an acute MI.  The patient was empirically placed on IV Avelox.  Spiral CT of the chest  done to rule out pulmonary embolus was negative for that; however, it  did show cardiomegaly, left ventricular hypertrophy, probable pulmonary  artery hypertension, small left pleural effusion, hepatomegaly, mild  atelectasis, left greater than right base.  His chest pain resolved on  antibiotics and pain medicine.  His uncontrolled hypertension was  treated with his usual Norvasc, Avalide, and labetalol.  He dialyzed one  treatment with 2.5 liters of ultrafiltrate removed only due to  complaints of cramping.  His estimated dry weight as an outpatient is  77.5 kg and it will be lowered 77 kg at the time of discharge.  EPO was  continued at 7500 units  IV each dialysis along with InFeD 100 mg IV and  Hectorol 1.5 mcg IV each dialysis.   Urine drug screen returned positive for cocaine.  It is possible that  his chest pain was attributed to cocaine abuse and resolved while in  hospital.  He has a long history of poly-substance abuse in the past.  This has been addressed with the patient many times.  At the time of  discharge, his white count had dropped to 11,500.  He had been afebrile.  He cannot afford Avelox as an outpatient and he was switched to generic  ciprofloxacin and doxycycline to complete a two week course of  antibiotics.   DISCHARGE MEDICATIONS:  Avalide 150/12.5, 2 pills q.a.m., Sular 40 mg  q.p.m., Duet 30 mg daily, Tums 2 with meals, aspirin 81 mg daily, Cipro  500 mg daily x10 more days,  doxycycline 100 mg b.i.d. x7 days, oxycodone  5 mg one every 4 hours p.r.n. pain.  New dry weight of 77 kg. EPO 7500  units IV each dialysis, Hectorol 1.5 mcg IV each dialysis, InFeD 100 mg  IV weekly.      Zenovia Jordan, P.A.      Garnetta Buddy, M.D.  Electronically Signed    RRK/MEDQ  D:  07/05/2006  T:  07/05/2006  Job:  [id number]

## 2010-09-30 NOTE — Discharge Summary (Signed)
NAMELANSON, RANDLE              ACCOUNT NO.:  [id number]   MEDICAL RECORD NO.:  [id number]          PATIENT TYPE:  INP   LOCATION:  4728                         FACILITY:  [facility]   PHYSICIAN:  Sheamus Hasting. Excell Seltzer, MD  DATE OF BIRTH:  Oct 14, 1955   DATE OF ADMISSION:  03/06/2006  DATE OF DISCHARGE:  03/14/2006                                 DISCHARGE SUMMARY   PROCEDURES:  1. Cardiac catheterization.  2. Coronary arteriogram.  3. Left ventriculogram.   DISCHARGE DIAGNOSES:  1. Congestive heart failure.  2. Systemic hypertension.  3. End-stage renal disease.  4. Preserved left ventricular systolic function.  5. Acute on chronic diastolic congestive heart failure.  6. Hyperparathyroidism.  7. Diabetes.  8. Dyslipidemia.  9. Iron deficiency anemia.  10.History of polysubstance abuse.  11.History of hepatitis C with cirrhosis.  12.Status post left upper extremity AV fistula in July 2007.  13.Proteinuria.  14.History of depression with a previous suicide attempt.  15.Anasarca.   HOSPITAL COURSE:  Mr. Petrosian is a 55 year old male with a previous history  of poly substance abuse including alcohol, tobacco and cocaine as well as  THC.  He was admitted in August 2007, for congestive heart failure and came  back October 23, with increasing edema.  He stated that he had never gotten  prescriptions filled when he was discharged from the hospital the last time  and had quit taking the medications after he ran out of samples provided him  by the hospital.  He was admitted for further evaluation and treatment.   His BUN and creatinine were significantly elevated with a BUN of 61 and  creatinine of 6.2.  A calcium level was low at 6.3.  His blood sugars were  elevated as well.  His iron was low at 27.  Hepatitis B surface antigen was  negative.  Parathyroid hormone was elevated at 248 and calcium for PTH was  low at 6.9.  His troponins were negative, but his CK-MBs were elevated with  a  peak of 402/18.7.  He was anemic with a hemoglobin of 10.5 which decreased  to 8.9 and a hematocrit that decreased to 25.7.   A renal consult was called because of his elevated BUN and creatinine and he  was seen by Dr. Arlean Hopping.  Dr. Arlean Hopping recommended that he go ahead and start  dialysis.  He was dialyzed on October 27, October 29, and March 14, 2006.  It was recommended that his outpatient dialysis be Tuesday, Thursday and  Saturday and this was set up.   His volume status improved with dialysis.  It was felt because of the  elevation in his cardiac enzymes, he needed cardiac catheterization to  further define his anatomy.  This was performed on March 13, 2006.  The  cardiac catheterization showed no critical coronary artery disease.  He had  normal systolic function with no obvious MR.   His blood pressure medications were restarted and uptitrated.  By March 14, 2006, his blood pressure was under much better control.  His chest pain  resolved.  He was seen by social  services and is to follow up with  [facility] as an outpatient for his primary care.  He is also to be  followed by cardiology.  He states that he get samples at times from the  renal doctor and from the mental health doctor.  Where possible, his  prescriptions were changed to [employer] four dollar drugs.   A March 14, 2006, Mr. Dubree was ambulating without chest pain or  shortness of breath.  His hemoglobin was 9.0 with hematocrit of 26.5.  He is  on Aranesp at dialysis and Nephro-Vite on a daily basis.  He is encouraged  to follow a diabetic renal diet and he was started on Amaryl.  He was not  able to get samples of his medications from the hospital because he had  previously received them in August and the hospital will only provide them  once a year.  Mr. Todorov was evaluated by Dr. Excell Seltzer and considered stable  for discharge with outpatient followup arranged.   ACTIVITY:  His activity level is to be  increased gradually.   FOLLOW UP:  He is to keep his dialysis appointment which is March 15, 2006, at 11 a.m.  A message has been left and he is to follow up with Dr.  Antoine Poche as an outpatient.  He is to continue work on obtaining a primary  MD.   DISCHARGE MEDICATIONS:  1. Fluoxetine 20 mg daily.  2. Clonidine 0.1 mg b.i.d.  3. Pravachol 4 mg daily.  4. Aspirin 81 mg and a day.  5. Os-Cal plus D daily.  6. Seroquel 100 mg nightly.  7. Glimepiride 1 mg daily.  8. Norvasc 5 mg on dialysis days.  9. Pindolol 5 mg b.i.d.  10.Dialyvite daily.      Theodore Demark, PA-C      Veverly Fells. Excell Seltzer, MD  Electronically Signed    RB/MEDQ  D:  03/14/2006  T:  03/15/2006  Job:  [id number]   cc:   Dyke Maes, M.D.

## 2010-09-30 NOTE — Discharge Summary (Signed)
NAME:  [name], [name]                        ACCOUNT NO.:  [id number]   MEDICAL RECORD NO.:  [id number]                   PATIENT TYPE:  IPS   LOCATION:  0505                                 FACILITY:  [facility]   PHYSICIAN:  Geoffery Lyons, M.D.                   DATE OF BIRTH:  20-Apr-1956   DATE OF ADMISSION:  12/31/2003  DATE OF DISCHARGE:  01/05/2004                                 DISCHARGE SUMMARY   CHIEF COMPLAINT AND PRESENT ILLNESS:  This is an involuntary admission for  this 55 year old divorced African-American male.  He presented to the  emergency room complaining of his legs and feet swelling as well as having  numbness.  He had not taken his medications in the past month.  He admitted  to using cocaine and basically was given an appointment to [facility].  Presented  to the emergency room at [facility] as well as [facility].  He was  told that he needed to straighten up and get healthier before they will help  him further.  Apparently, he went off and climbed up on a bridge and decided  he may as well jump off it.  But, he let himself be taken back to the  emergency room by the police who were called because, of course, he was by  now suicidal.  Concerned about his liver disease.  His urine showed that he  had moderate hemoglobin in his urine and the glucose was elevated in his  urine at that time.  Positive for cocaine.  Ammonia level slightly elevated  at 39.   PAST PSYCHIATRIC HISTORY:  No follow-up past jail.   ALCOHOL/DRUG HISTORY:  Uses alcohol, crack and marijuana.  Last use was  August 16th.  Does not really want to talk about that.   MEDICAL HISTORY:  Type 2 diabetes mellitus, hypertension.   MEDICATIONS:  Prescribed Kay Ciel 20 mEq every other day, glipizide 5 mg  daily, hydrochlorothiazide 25 mg daily.   PHYSICAL EXAMINATION:  Performed and failed to show any acute findings.   LABORATORY DATA:  Liver enzymes within normal limits.  Hemoglobin A1C 8.4.  Lipid  244, triglycerides 170.  Glucose 261.   MENTAL STATUS EXAM:  Upon admission revealed an alert, cooperative male.  Appropriately groomed, hospital gown.  Speech was not pressured.  Very  irritable.  His affect was euthymic.  Thought processes were clear and  rational.  Judgment and insight are intact.  Concentration and memory are  intact.  Intelligence is average.  Denied any suicidal or homicidal  ideation.  Denies auditory or visual hallucinations, although he claimed he  heard a little voice now and then he tells him to do things and sometimes he  does it and sometimes he does not.   ADMISSION DIAGNOSES:   AXIS I:  1.  Polysubstance abuse.  2.  Depressive disorder not otherwise specified.  AXIS II:  No diagnosis.   AXIS III:  1.  Hypertension.  2.  Insulin-dependent diabetes mellitus.   AXIS IV:  Severe.   AXIS V:  Global Assessment of Functioning upon admission 30; highest in the  last year 70.   HOSPITAL COURSE:  He was admitted and started in individual and group  psychotherapy.  He was placed on a sliding scale.  He was given potassium 20  mEq daily x 3 days.  He was given Librium 25 mg every six hours as needed,  Seroquel 50 mg every six hours, trazodone 50 mg at night, Symmetrel 100 mg  twice a day, Kay Ciel 20 mEq daily, glipizide 5 mg every day,  hydrochlorothiazide 25 mg daily.  He was placed on Cymbalta 30 mg per day.  We got a consult from nutritional management.  He was given Neurontin 200 mg  four times a day.  He endorsed multiple substance abuse, multiple  environmental issues.  Not following up with the medical problems.  Says he  is diabetic, has high blood pressure and he is not on medications.  Has not  been able to have follow-up.  He is very irritable, very resorted, very  resistant to answer his questions.  Mood was anxious, depressed.  Affect was  anxious, depressed.  Feeling overwhelmed, multiple needs, very irritable,  angry.  Blood pressure was  increased.  He was given some clonidine.  Initially quite sedated.  He was less sedated by August 22nd.  He was more  open and able and willing to deal with his issues.  He was wanting to go  into a long-term treatment center.  He saw the nutritionist.  There were  multiple medical issues identified that he needed to pursue treatment for  including arterial hypertension, cirrhosis.  On August 23rd, he was in full  contact with reality.  No suicidal ideation.  No homicidal ideation.  No  hallucinations.  No delusions.  Fully detoxed.  Willing to pursue further  stabilization.  Will be followed up at [facility].  Will be staying with  his mother.  He was trying to get himself organized to be there for his  daughter, who was going back to school and, overall, his mood had improved.  His affect was brighter.  He was willing and motivated to pursue outpatient  treatment.   DISCHARGE DIAGNOSES:   AXIS I:  1.  Depressive disorder not otherwise specified.  2.  Polysubstance abuse.   AXIS II:  No diagnosis.   AXIS III:  1.  Hypertension.  2.  Non-insulin-dependent diabetes mellitus.   AXIS IV:  Severe.   AXIS V:  Global Assessment of Functioning upon discharge 55.   DISCHARGE MEDICATIONS:  1.  Symmetrel 100 mg twice a day.  2.  K-Dur 20 mEq daily.  3.  NPH insulin 30 units subcutaneous in the morning, 28 units subcutaneous      in the afternoon.  4.  Lotensin 10 mg daily.  5.  Glucotrol 5 mg in the morning.  6.  Hydrochlorothiazide 25 mg daily.  7.  Cymbalta 30 mg daily.   FOLLOW UP:  ADS.                                               Geoffery Lyons, M.D.    IL/MEDQ  D:  01/27/2004  T:  01/28/2004  Job:  [id number]
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
## 2010-09-30 NOTE — Op Note (Signed)
[name], [name]              ACCOUNT NO.:  [id number]   MEDICAL RECORD NO.:  [id number]          PATIENT TYPE:  AMB   LOCATION:  SDS                          FACILITY:  [facility]   PHYSICIAN:  Balinda Quails, M.D.    DATE OF BIRTH:  07/25/1955   DATE OF PROCEDURE:  11/17/2005  DATE OF DISCHARGE:                                 OPERATIVE REPORT   SURGEON:  Denman George, MD.   ASSISTANT:  Charlesetta Garibaldi, PA.   ANESTHETIC:  Local with MAC.   PREOPERATIVE DIAGNOSIS:  Chronic renal insufficiency.   POSTOPERATIVE DIAGNOSIS:  Chronic renal insufficiency.   PROCEDURE:  Left brachial cephalic arteriovenous fistula.   OPERATIVE PROCEDURE:  The patient was brought to the operating room in  stable condition.  Placed in supine position.  Left arm prepped and draped  in a sterile fashion.  The skin and subcutaneous tissues was instilled with  1% Xylocaine with epinephrine.  A transverse skin incision made through the  left antecubital fossa.  Dissection carried down through the subcutaneous  tissue.  The antecubital veins identified.  The cephalic vein freed,  encircled with a vessel loop.  The cephalic vein ligated at its junction  with the basilic vein.  Dilated up to 3-4 mm in size.  Flushed with heparin  saline and controlled with a bulldog clamp.  Deep dissection then carried  down to expose the brachial artery.  This was freed and encircled with  vessel loops proximally and distally.  The patient administered 3000 units  of heparin intravenously.  Adequate circulation time permitted.   The brachial artery controlled with bulldog clamps.  A longitudinal  arteriotomy made.  The vein anastomosed end-to-side to the brachial artery  using running 7-0 Prolene suture.  Clamps then removed.  Excellent flow  present.  Adequate hemostasis obtained.  Sponge and instrument counts  correct.   Subcutaneous tissue closed with running 3-0 Vicryl suture.  Skin closed with  4-0 Monocryl.   Steri-Strips applied.  No apparent complications.  The  patient transferred to recovery room in stable condition.      Balinda Quails, M.D.  Electronically Signed     PGH/MEDQ  D:  11/17/2005  T:  11/17/2005  Job:  [id number]

## 2010-09-30 NOTE — H&P (Signed)
NAME:  [name], [name]                        ACCOUNT NO.:  [id number]   MEDICAL RECORD NO.:  [id number]                   PATIENT TYPE:  IPS   LOCATION:  0505                                 [facility]ACILITY[facility]  [facility]   PHYSICIAN:  Geoffery Lyons, M.D.                   DATE OF BIRTH:  21-Jan-1956   DATE OF ADMISSION:  12/31/2003  DATE OF DISCHARGE:                         PSYCHIATRIC ADMISSION ASSESSMENT   IDENTIFYING INFORMATION:  This is an involuntary admission.  This is a 55-  year-old divorced African-American male.  Apparently, he presented to the  emergency room complaining of his legs and feet swelling as well as having  numbness.  He had not taken his medications in the past month.  He admitted  to using cocaine and, basically, was given an appointment to ADS.  The  patient frequently presents to the emergency room at [facility] as well as  [facility] and he was told that he needed to straighten up and get  healthier before they would help him further.  Apparently, he went off and  climbed up on a bridge and decided he may as well jump off it but he let  himself be taken back to the emergency room by the police who were called  because of course he was by now suicidal.  Apparently, he is concerned about  his liver disease back in July when they did a lab analysis of him.  His  urine showed that he had moderate hemoglobin in his urine and the glucose  was elevated in his urine at that time.  He was positive for cocaine on his  drug abuse screen and his ammonia level was slightly elevated at 39; the  upper limits are 35 and, given that he drinks, that is not unusual.   PAST PSYCHIATRIC HISTORY:  He has had some treatment whenever he is  incarcerated.  However, he has had no follow-up past jail.   SOCIAL HISTORY:  He has a GED.  He is divorced.  He has four children, three  daughters, ages 24 (she will be 15 tomorrow), a 55 year old and an 11-year-  old and he has a 88 year old  son.  He is no employed.  He has no income.  He  is homeless.   FAMILY HISTORY:  He denies.   ALCOHOL/DRUG HISTORY:  He uses alcohol, crack and marijuana.  Last use was  December 29, 2003.  Does not really want to talk about that.   PRIMARY CARE PHYSICIAN:  Has no primary health care Skarlette Lattner.   MEDICAL PROBLEMS:  He is felt to have type 2 diabetes and hypertension.  His  blood pressure on admission was elevated at 160/90.  I do not yet have his  CBG results and he is unsure of exactly what they have prescribed for him.   ALLERGIES:  No known drug allergies.   MEDICATIONS:  There is a tape  from the ER.  He was prescribed Kay-Ciel 20  mEq p.o. q.d. and glipizide 5 mg p.o. q.d. and I ordered some  hydrochlorothiazide 25 mg to address his blood pressure.   PHYSICAL EXAMINATION:  Not repeated as it is well-documented on the chart.   MENTAL STATUS EXAM:  He is alert and oriented albeit that he is in bed.  He  appeared to be appropriately groomed.  He had a hospital gown on.  His  speech was not pressured.  He was very irritable.  His affect was euthymic.  His thought processes are clear and rational.  His judgment and insight are  intact.  His concentration and memory are intact.  His intelligence is at  least average.  He denies suicidal or homicidal ideation at this time.  He  denies auditory or visual hallucinations at this time, although he decided  that he does in fact hear a little voice now and then that tells him to do  things and sometimes he does it and sometimes he does not.   DIAGNOSES:   AXIS I:  1. Polysubstance abuse.  2. Depressive disorder secondary to polysubstance abuse, chronic illness,     diabetes.   AXIS II:  Deferred.   AXIS III:  1. Hypertension.  2. Non-insulin-dependent diabetes mellitus.   AXIS IV:  Severe (he is homeless, he has no income, he is on probation).   AXIS V:  30.   PLAN:  Admit for safety and stabilization.  He is homeless.  He  denies  income.  He is on probation.  Not to start medications that he cannot get  from [facility] or out in the community with no copay.     Mickie Leonarda Salon, P.A.-C.               Geoffery Lyons, M.D.    MD/MEDQ  D:  12/31/2003  T:  01/01/2004  Job:  [id number]
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
## 2010-09-30 NOTE — Discharge Summary (Signed)
NAME:  [name], [name]                        ACCOUNT NO.:  [id number]   MEDICAL RECORD NO.:  [id number]                   PATIENT TYPE:  INP   LOCATION:  0454                                 FACILITY:  [facility]   PHYSICIAN:  Harrie Jeans, M.D.                  DATE OF BIRTH:  08/09/1955   DATE OF ADMISSION:  07/20/2002  DATE OF DISCHARGE:  07/21/2002                                 DISCHARGE SUMMARY   DISCHARGE DIAGNOSES:  1. Intoxication, crack cocaine.  2. Diabetes type 2, uncontrolled, noncompliant.   DISCHARGE MEDICATIONS:  1. Thiamine 100 mg daily.  2. Folic acid 1 mg daily.  3. Glucotrol 5 mg daily.   HISTORY OF PRESENT ILLNESS:  The patient is a 55 year old African-American  male with a history of diabetes type 2, noncompliant, with history of  polysubstance abuse who presented to the emergency department with confusion  and increased blood sugar.  His son reports that he arrived at his sister's  house with confusion and thought to be high.  The patient had an unsteady  gait, fell down, but was able to get up on his own.  He had decreased verbal  communication and was lethargic.  In the emergency department he was found  to have an elevated blood sugar of 600.   LABORATORY DATA:  Sodium 140, potassium 3.5, chloride 107, CO2 29, glucose  202, BUN 14, creatinine 0.9.  Wbc's 9.7, hemoglobin 13.1, hematocrit 37.6,  platelets 169.  Troponin 0.02.  Urine drug screen positive for cocaine.   PROCEDURES:  None.   STUDIES:  CT of the head negative for acute intracranial hemorrhage or  edema.   HOSPITAL COURSE:  #1 - CONFUSION.  The patient was examined in the emergency  department and was not confused at the time of exam.  He admitted to smoking  marijuana and using crack cocaine.  He did report some chronic cough but  otherwise no complaints.  He was admitted to a regular bed and was observed.  On hospital day #2 his confusion was resolved and it was thought secondary  to  his intoxication, and it was felt that he was okay for discharge hospital  day #2.   #2 - DIABETES.  In the emergency department initial CBG was found to be 600.  The patient received 10 units of regular insulin.  CBGs corrected to 100.  The patient was restarted on Glucotrol and will need to be restarted on  metformin after discharge.   #3 - COCAINE ABUSE.  The social worker was consulted and the patient was  informed of various rehab programs that might be available.    DISPOSITION:  The patient is discharged in stable condition.   FOLLOW-UP:  The patient will follow up with [facility].  Harrie Jeans, M.D.    WA/MEDQ  D:  09/17/2002  T:  09/18/2002  Job:  [id number]

## 2010-09-30 NOTE — Discharge Summary (Signed)
[name], GREENER              ACCOUNT NO.:  [id number]   MEDICAL RECORD NO.:  [id number]          PATIENT TYPE:  OBV   LOCATION:  4705                         FACILITY:  [facility]   PHYSICIAN:  [name], NPDATE OF BIRTH:  1955-09-09   DATE OF ADMISSION:  08/16/2006  DATE OF DISCHARGE:                               DISCHARGE SUMMARY   PRIMARY CARDIOLOGIST:  [name]   PRIMARY CARE PHYSICIAN:  [name]   PROCEDURES PERFORMED DURING HOSPITALIZATION:  None.   DISCHARGE DIAGNOSES:  1. Pericarditis.  2. Known coronary artery disease      a.     Status post cardiac catheterization October 2007 with left       ventricular systolic function preserved and no significant MR or       aortic root enlargement.  The RCA had minor irregularities, the       left main was free of disease, and the LAD system had no       significant disease.  The ramus intermedius had 30% stenosis of       the ostium and the circumflex system had no significant disease.   SECONDARY DIAGNOSES:  1. Diabetes, diet-controlled.  2. Hypertension.  3. Hyperlipidemia.  4. History of ethanol (ETOH), drug abuse, ongoing tobacco abuse.  5. Chronic kidney disease, end-stage renal disease, on hemodialysis      since November of 2007.  6. Chronic diastolic congestive heart failure.  7. Secondary hyperparathyroidism.  8. History of hepatitis C with cirrhosis.  9. History of depression and suicide attempt.  10.Iron-deficiency anemia.   HISTORY OF PRESENT ILLNESS:  This is a 55 year old African American male  with history as stated above who complained of chest pain approximately  1 week ago.  The patient states it would come and go and then never went  away.  Secondary to this, the patient presented to the emergency room.  It was found that the pain was worse with movement, deep inspiration,  and cough.  He states that the pain was rated at 8/10, and he was unable  to sleep.  On  arrival to the emergency room, the patient was seen and  examined by [name], physician assistant, and [name],  M.D.  The patient was given Toradol 30 mg x1 with significant  improvement.  The patient was found to have symptoms consistent with  pericarditis without rub noted on physical exam.  EKG showed nonspecific  abnormalities.  The patient was admitted to rule out myocardial  infarction and given NSAIDs for pain relief.   Cardiac enzymes were found to be negative at 0.04, CK 78, CK-MB 2.4.  The patient was found to have relief after 24 hours, seen and examined  by [name].  On day of discharge, the patient was up walking in  the hall without complaints of chest pain or shortness of breath and was  found to be stable for discharge.  The patient was started on ibuprofen  on discharge with follow-up appointment with [name] and continued  followup with nephrology.   DISCHARGE LABS:  Hemoglobin 12.9, hematocrit 38.0, white blood cell is  14.6, platelets 209.  PT 13.9, INR 1.  Sodium 133, potassium 4.1,  chloride 101, glucose 125, BUN 61.  Hemoglobin A1c 7.8. Troponin  negative x3 at 0.07, 0.05 and 0.4, respectively.  BNP 146.  Cholesterol  140, lipids 95, HDL 40, LDL 81.   VITAL SIGNS ON DISCHARGE:  Blood pressure 126/80, pulse 70, respirations  20, temperature 97.5, O2 sat 100% on room air.  Patient's weight 78.2 kg  on discharge.   FOLLOW-UP PLAN AND APPOINTMENTS:  1. The patient will follow up with [name] on May 7 at 3:00      p.m.  2. The patient will follow up with primary care physician; the patient      is to call.  3. The patient is to continue with followup through nephrology and      continue dialysis as prior to admission.  4. The patient has been provided with a prescription for ibuprofen to      take 3 times a day for 3 days and p.r.n.   DISCHARGE MEDICATIONS:  1. Aspirin 325 mg once a day.  2. Protonix 40 mg daily.  3. Sular  20 mg b.i.d.  4. Avapro 300 mg daily.  5. Calcium carbonate (Tums) 3 tablets 3 times a day.  6. Renal formula vitamin once a day.  7. Ibuprofen 800 mg 3 times a day x3 days and p.r.n. thereafter      (prescription provided).   ALLERGIES:  NO KNOWN DRUG ALLERGIES.   Time spent with the patient to include physician time:  30 minutes.      Jerome Mare. Lyman Bishop, NP     KML/MEDQ  D:  08/17/2006  T:  08/17/2006  Job:  [id number]   cc:   Dyke Branch, M.D.
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
## 2012-02-03 ENCOUNTER — Inpatient Hospital Stay (HOSPITAL_COMMUNITY)
Admission: EM | Admit: 2012-02-03 | Discharge: 2012-02-08 | DRG: 371 | Disposition: A | Discharge status: Home / Self Care | Payer: Medicare Other | Attending: Internal Medicine | Admitting: Internal Medicine

## 2012-02-03 ENCOUNTER — Emergency Department (HOSPITAL_COMMUNITY): Payer: Medicare Other

## 2012-02-03 ENCOUNTER — Encounter (HOSPITAL_COMMUNITY): Payer: Self-pay

## 2012-02-03 ENCOUNTER — Inpatient Hospital Stay (HOSPITAL_COMMUNITY): Payer: Medicare Other

## 2012-02-03 DIAGNOSIS — Z992 Dependence on renal dialysis: Secondary | ICD-10-CM

## 2012-02-03 DIAGNOSIS — I120 Hypertensive chronic kidney disease with stage 5 chronic kidney disease or end stage renal disease: Secondary | ICD-10-CM | POA: Diagnosis present

## 2012-02-03 DIAGNOSIS — K859 Acute pancreatitis without necrosis or infection, unspecified: Secondary | ICD-10-CM

## 2012-02-03 DIAGNOSIS — B1920 Unspecified viral hepatitis C without hepatic coma: Secondary | ICD-10-CM | POA: Diagnosis present

## 2012-02-03 DIAGNOSIS — I509 Heart failure, unspecified: Secondary | ICD-10-CM | POA: Diagnosis present

## 2012-02-03 DIAGNOSIS — A0472 Enterocolitis due to Clostridium difficile, not specified as recurrent: Principal | ICD-10-CM | POA: Diagnosis present

## 2012-02-03 DIAGNOSIS — K7460 Unspecified cirrhosis of liver: Secondary | ICD-10-CM | POA: Diagnosis present

## 2012-02-03 DIAGNOSIS — E875 Hyperkalemia: Secondary | ICD-10-CM

## 2012-02-03 DIAGNOSIS — K56609 Unspecified intestinal obstruction, unspecified as to partial versus complete obstruction: Secondary | ICD-10-CM

## 2012-02-03 DIAGNOSIS — E1129 Type 2 diabetes mellitus with other diabetic kidney complication: Secondary | ICD-10-CM | POA: Diagnosis present

## 2012-02-03 DIAGNOSIS — F329 Major depressive disorder, single episode, unspecified: Secondary | ICD-10-CM | POA: Diagnosis present

## 2012-02-03 DIAGNOSIS — Z79899 Other long term (current) drug therapy: Secondary | ICD-10-CM

## 2012-02-03 DIAGNOSIS — I10 Essential (primary) hypertension: Secondary | ICD-10-CM

## 2012-02-03 DIAGNOSIS — Z7982 Long term (current) use of aspirin: Secondary | ICD-10-CM

## 2012-02-03 DIAGNOSIS — E119 Type 2 diabetes mellitus without complications: Secondary | ICD-10-CM

## 2012-02-03 DIAGNOSIS — D696 Thrombocytopenia, unspecified: Secondary | ICD-10-CM | POA: Diagnosis present

## 2012-02-03 DIAGNOSIS — N2581 Secondary hyperparathyroidism of renal origin: Secondary | ICD-10-CM | POA: Diagnosis present

## 2012-02-03 DIAGNOSIS — K5669 Other intestinal obstruction: Secondary | ICD-10-CM | POA: Diagnosis present

## 2012-02-03 DIAGNOSIS — K219 Gastro-esophageal reflux disease without esophagitis: Secondary | ICD-10-CM | POA: Diagnosis present

## 2012-02-03 DIAGNOSIS — F3289 Other specified depressive episodes: Secondary | ICD-10-CM | POA: Diagnosis present

## 2012-02-03 DIAGNOSIS — R112 Nausea with vomiting, unspecified: Secondary | ICD-10-CM

## 2012-02-03 DIAGNOSIS — I252 Old myocardial infarction: Secondary | ICD-10-CM

## 2012-02-03 DIAGNOSIS — F17200 Nicotine dependence, unspecified, uncomplicated: Secondary | ICD-10-CM | POA: Diagnosis present

## 2012-02-03 DIAGNOSIS — N186 End stage renal disease: Secondary | ICD-10-CM

## 2012-02-03 HISTORY — DX: Gastro-esophageal reflux disease without esophagitis: K21.9

## 2012-02-03 HISTORY — DX: Acute myocardial infarction, unspecified: I21.9

## 2012-02-03 HISTORY — DX: Essential (primary) hypertension: I10

## 2012-02-03 LAB — COMPREHENSIVE METABOLIC PANEL
ALT: 53 U/L (ref 0–53)
BUN: 43 mg/dL — ABNORMAL HIGH (ref 6–23)
CO2: 26 mEq/L (ref 19–32)
Calcium: 11.1 mg/dL — ABNORMAL HIGH (ref 8.4–10.5)
GFR calc Af Amer: 6 mL/min — ABNORMAL LOW (ref >90)
GFR calc non Af Amer: 5 mL/min — ABNORMAL LOW (ref >90)
Glucose, Bld: 130 mg/dL — ABNORMAL HIGH (ref 70–99)
Sodium: 132 mEq/L — ABNORMAL LOW (ref 135–145)
Total Protein: 10.3 g/dL — ABNORMAL HIGH (ref 6.0–8.3)

## 2012-02-03 LAB — CBC
MCV: 90.6 fL (ref 78.0–100.0)
Platelets: 89 10*3/uL — ABNORMAL LOW (ref 150–400)
RBC: 5.87 MIL/uL — ABNORMAL HIGH (ref 4.22–5.81)
WBC: 11.5 10*3/uL — ABNORMAL HIGH (ref 4.0–10.5)

## 2012-02-03 LAB — CREATININE, SERUM
Creatinine, Ser: 7.13 mg/dL — ABNORMAL HIGH (ref 0.50–1.35)
GFR calc Af Amer: 9 mL/min — ABNORMAL LOW (ref >90)
GFR calc non Af Amer: 8 mL/min — ABNORMAL LOW (ref >90)

## 2012-02-03 LAB — CBC WITH DIFFERENTIAL/PLATELET
Blasts: 0 %
Eosinophils Absolute: 1.4 10*3/uL — ABNORMAL HIGH (ref 0.0–0.7)
Eosinophils Relative: 13 % — ABNORMAL HIGH (ref 0–5)
Lymphocytes Relative: 8 % — ABNORMAL LOW (ref 12–46)
Lymphs Abs: 0.8 10*3/uL (ref 0.7–4.0)
MCV: 88.7 fL (ref 78.0–100.0)
Metamyelocytes Relative: 0 %
Monocytes Absolute: 0.6 10*3/uL (ref 0.1–1.0)
Monocytes Relative: 6 % (ref 3–12)
Neutro Abs: 7.7 10*3/uL (ref 1.7–7.7)
Neutrophils Relative %: 73 % (ref 43–77)
Platelets: 91 10*3/uL — ABNORMAL LOW (ref 150–400)
RBC: 5.59 MIL/uL (ref 4.22–5.81)
RDW: 17.2 % — ABNORMAL HIGH (ref 11.5–15.5)
WBC: 10.5 10*3/uL (ref 4.0–10.5)
nRBC: 0 /100 WBC

## 2012-02-03 LAB — GLUCOSE, CAPILLARY
Glucose-Capillary: 144 mg/dL — ABNORMAL HIGH (ref 70–99)
Glucose-Capillary: 155 mg/dL — ABNORMAL HIGH (ref 70–99)

## 2012-02-03 LAB — LIPASE, BLOOD: Lipase: 129 U/L — ABNORMAL HIGH (ref 11–59)

## 2012-02-03 MED ORDER — DIPHENHYDRAMINE HCL 50 MG/ML IJ SOLN
INTRAMUSCULAR | Status: AC
  Administered 2012-02-03: 25 mg via INTRAVENOUS
  Filled 2012-02-03: qty 1

## 2012-02-03 MED ORDER — LORAZEPAM 2 MG/ML IJ SOLN
0.5000 mg | Freq: Once | INTRAMUSCULAR | Status: AC
  Administered 2012-02-03: 0.5 mg via INTRAVENOUS
  Filled 2012-02-03: qty 1

## 2012-02-03 MED ORDER — SODIUM CHLORIDE 0.9 % IV SOLN: INTRAVENOUS | Status: DC

## 2012-02-03 MED ORDER — DIPHENHYDRAMINE HCL 50 MG/ML IJ SOLN
25.0000 mg | Freq: Once | INTRAMUSCULAR | Status: AC
  Administered 2012-02-03: 25 mg via INTRAVENOUS

## 2012-02-03 MED ORDER — PROMETHAZINE HCL 25 MG/ML IJ SOLN
25.0000 mg | Freq: Once | INTRAMUSCULAR | Status: AC
  Administered 2012-02-03: 25 mg via INTRAMUSCULAR
  Filled 2012-02-03: qty 1

## 2012-02-03 MED ORDER — PROMETHAZINE HCL 25 MG/ML IJ SOLN
INTRAMUSCULAR | Status: AC
  Administered 2012-02-03: 25 mg via INTRAVENOUS
  Filled 2012-02-03: qty 1

## 2012-02-03 MED ORDER — SODIUM CHLORIDE 0.9 % IV SOLN
1.0000 g | Freq: Once | INTRAVENOUS | Status: DC
  Filled 2012-02-03: qty 10

## 2012-02-03 MED ORDER — ONDANSETRON HCL 4 MG/2ML IJ SOLN
INTRAMUSCULAR | Status: AC
  Administered 2012-02-03: 4 mg
  Filled 2012-02-03: qty 2

## 2012-02-03 MED ORDER — PROMETHAZINE HCL 25 MG/ML IJ SOLN
INTRAMUSCULAR | Status: AC
  Administered 2012-02-03: 25 mg via INTRAMUSCULAR
  Filled 2012-02-03: qty 1

## 2012-02-03 MED ORDER — HEPARIN SODIUM (PORCINE) 1000 UNIT/ML DIALYSIS: 100.0000 [IU]/kg | INTRAMUSCULAR | Status: DC | PRN

## 2012-02-03 MED ORDER — HEPARIN SODIUM (PORCINE) 1000 UNIT/ML DIALYSIS
100.0000 [IU]/kg | INTRAMUSCULAR | Status: DC | PRN
  Administered 2012-02-03: 7000 [IU] via INTRAVENOUS_CENTRAL

## 2012-02-03 MED ORDER — MORPHINE SULFATE 2 MG/ML IJ SOLN
0.5000 mg | INTRAMUSCULAR | Status: DC | PRN
  Administered 2012-02-05 – 2012-02-07 (×8): 0.5 mg via INTRAVENOUS
  Filled 2012-02-03 (×9): qty 1

## 2012-02-03 MED ORDER — SODIUM CHLORIDE 0.9 % IJ SOLN
3.0000 mL | Freq: Two times a day (BID) | INTRAMUSCULAR | Status: DC
  Administered 2012-02-03 – 2012-02-06 (×3): 3 mL via INTRAVENOUS

## 2012-02-03 MED ORDER — SEVELAMER CARBONATE 800 MG PO TABS
1600.0000 mg | ORAL_TABLET | Freq: Three times a day (TID) | ORAL | Status: DC
  Filled 2012-02-03 (×2): qty 2

## 2012-02-03 MED ORDER — ONDANSETRON HCL 4 MG PO TABS: 4.0000 mg | ORAL_TABLET | Freq: Four times a day (QID) | ORAL | Status: DC | PRN

## 2012-02-03 MED ORDER — ONDANSETRON HCL 4 MG/2ML IJ SOLN: 4.0000 mg | Freq: Four times a day (QID) | INTRAMUSCULAR | Status: DC | PRN

## 2012-02-03 MED ORDER — ONDANSETRON HCL 4 MG/2ML IJ SOLN
INTRAMUSCULAR | Status: AC
  Filled 2012-02-03: qty 2

## 2012-02-03 MED ORDER — PROMETHAZINE HCL 25 MG/ML IJ SOLN: 25.0000 mg | Freq: Once | INTRAMUSCULAR | Status: DC

## 2012-02-03 MED ORDER — HYDRALAZINE HCL 20 MG/ML IJ SOLN
5.0000 mg | Freq: Four times a day (QID) | INTRAMUSCULAR | Status: DC | PRN
  Administered 2012-02-03: 5 mg via INTRAVENOUS
  Filled 2012-02-03 (×2): qty 0.25

## 2012-02-03 MED ORDER — ENOXAPARIN SODIUM 30 MG/0.3ML ~~LOC~~ SOLN
30.0000 mg | SUBCUTANEOUS | Status: DC
  Administered 2012-02-03 – 2012-02-07 (×5): 30 mg via SUBCUTANEOUS
  Filled 2012-02-03 (×6): qty 0.3

## 2012-02-03 MED ORDER — PROMETHAZINE HCL 25 MG/ML IJ SOLN
25.0000 mg | INTRAMUSCULAR | Status: DC | PRN
  Administered 2012-02-03: 25 mg via INTRAVENOUS
  Filled 2012-02-03: qty 1

## 2012-02-03 NOTE — ED Notes (Signed)
Critical Potassium result given to MD 7.5

## 2012-02-03 NOTE — ED Notes (Signed)
While pt in Xray, tech states that IV came out.while pt was vomiting

## 2012-02-03 NOTE — Progress Notes (Addendum)
Patient arrived on unit from hemodialysis. Patient alert and oriented no complaints of pain. No piv, IV team paged to place. Telemetry placed and patient oriented to unit. Pt refused to remove pants for skin assessment or put on hospital gown. Steele Berg RN

## 2012-02-03 NOTE — Consult Note (Signed)
[facility] CONSULT NOTE    Date: 02/03/2012                  Patient Name:  [name]  MRN: [id number]  DOB: December 08, 1955  Age / Sex: 56 y.o., male         PCP: No primary provider on file.                 Service Requesting Consult: [facility],  Dr. Peggye Pitt                 Reason for Consult: ESRD with hyperkalemia            History of Present Illness: Patient is a 56 y.o. male with a PMHx of ESRD on HD, Hep C, CHF and Hypertension, who was admitted to [facility] on 02/03/2012 for evaluation of N/V. Patient is ESRD secondary to Hypertension/DM. He has been dialysis dependent since 02/2006 and receives TTS HD at [facility] as of 01/23/12 when he moved back from [place] (previously at [facility] center but moved to [place] for 18 months.) He received full treatment on Thursday, and partial treatment on Thursday - only 30 min short. He did not report to the dialysis center today for scheduled treatment secondary to N/V and overall feeling ill. Patient states he has been having nausea, vomiting and abdominal pain for the last 2 days. He reports subjective fevers and chills. No diarrhea. Denies hematemesis. Abdominal pain is diffuse and intermittent. He reported to the ED this morning. On initial labs, his K+ was 7.5 (?hemolyzed). He received calcium gluconate in the ED and Nephrology was called for HD.   Patient's access is a left upper arm AV fistula since 2007. He did have problems with his access in July of this year and it was declotted in [place], [place] at that time. Of note, according to outpatient records, he has not been taking medications because he cannot afford them.       Medications: Outpatient medications: (per HD records) Ambien 10mg  qhs Neurontin 100mg  qhs Renavite Daily Clonidine 0.3mg  qhs Lisinopril 40mg  BID Coreg 25mg  BID ASA 325mg  daily Norvasc 10mg  qhs Renvela 800mg  2 tabs TID with meals Venofer 50mg  q Thursday Epogen three  times per week  Current medications: Current Facility-Administered Medications  Medication Dose Route Frequency Provider Last Rate Last Dose  . 0.9 %  sodium chloride infusion   Intravenous Continuous Toy Baker, MD      . calcium gluconate 1 g in sodium chloride 0.9 % 100 mL IVPB  1 g Intravenous Once Toy Baker, MD      . diphenhydrAMINE (BENADRYL) injection 25 mg  25 mg Intravenous Once [name], MD   25 mg at 02/03/12 1119  . heparin injection 100 Units/kg  100 Units/kg Dialysis PRN [name], MD   7,000 Units at 02/03/12 1050  . ondansetron (ZOFRAN) 4 MG/2ML injection        4 mg at 02/03/12 0906  . ondansetron (ZOFRAN) 4 MG/2ML injection           . promethazine (PHENERGAN) injection 25 mg  25 mg Intramuscular Once Toy Baker, MD   25 mg at 02/03/12 1109  . promethazine (PHENERGAN) injection 25 mg  25 mg Intravenous Q4H PRN [name], MD       No current outpatient prescriptions on file.     Allergies: No Known Allergies   Past Medical History:  HTN- DM- Diet controlled now that he is ESRD Hep C CHF- Last echo on file was 2007 with EF of 50%  Past Surgical History: No past surgical history on file.  Family History: No family history on file.  Social History: History   Social History  . Marital Status: Divorced    Spouse Name: N/A    Number of Children: N/A  . Years of Education: N/A   Occupational History  . Not on file.   Social History Main Topics  . Smoking status: Current Every Day Smoker  . Smokeless tobacco: Not on file  . Alcohol Use: No  . Drug Use: No  . Sexually Active:    Other Topics Concern  . Not on file   Social History Narrative  . No narrative on file   Review of Systems: As per HPI  Vital Signs: Blood pressure 193/114, pulse 91, temperature 97 F (36.1 C), temperature source Oral, resp. rate 12, weight 154 lb 8.7 oz (70.1 kg), SpO2 96.00%.  Weight trends: Filed Weights    02/03/12 1047  Weight: 154 lb 8.7 oz (70.1 kg)    Physical Exam: General: Vital signs reviewed and noted; hypertensive. In bed, appears uncomfortable.  Head: Normocephalic, atraumatic.  Eyes: EOMI, No signs of anemia or jaundice.  Nose: Mucous membranes moist, not inflammed, nonerythematous.  Neck: No deformities, masses, or tenderness noted.  Lungs:  Does not take deep breaths. Clear to auscultation BL with no wheezing appreciated  Heart: RRR. No murmur appreciated. Good peripheral pulses  Abdomen:  BS normoactive. Soft, no definite tenderness. 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 Accessed for HD. No pretibial edema.  Neurologic: Good grip. Mentation normal  Skin: No visible rashes   Lab results: Basic Metabolic Panel:  Lab 02/03/12 1610 02/03/12 0905  NA -- 132*  K 3.8 >7.5*  CL -- 86*  CO2 -- 26  GLUCOSE -- 130*  BUN -- 43*  CREATININE -- 10.09*  CALCIUM -- 11.1*  MG -- --  PHOS -- --    Liver Function Tests:  Lab 02/03/12 0905  AST 94*  ALT 53  ALKPHOS 92  BILITOT 0.7  PROT 10.3*  ALBUMIN 4.6    Lab 02/03/12 0905  LIPASE 129*  AMYLASE --   No results found for this basename: AMMONIA:3 in the last 168 hours  CBC:  Lab 02/03/12 0905  WBC 10.5  NEUTROABS 7.7  HGB 17.5*  HCT 49.6  MCV 88.7  PLT 91*   Microbiology: Results for orders placed during the hospital encounter of 02/09/10  MRSA PCR SCREENING     Status: Normal   Collection Time   02/09/10  3:42 AM      Component Value Range Status Comment   MRSA by PCR    NEGATIVE Final    Value: NEGATIVE            The GeneXpert MRSA Assay (FDA     approved for NASAL specimens     only), is one component of a     comprehensive MRSA colonization     surveillance program. It is not     intended to diagnose MRSA     infection nor to guide or     monitor treatment for     MRSA infections.    Imaging: Dg Abd Acute W/chest  02/03/2012  *RADIOLOGY REPORT*  Clinical Data:  Abdominal pain, nausea, vomiting  ACUTE ABDOMEN SERIES (ABDOMEN 2 VIEW & CHEST 1 VIEW)  Comparison:  CT scan 02/14/2010  Findings: Cardiomediastinal silhouette is unremarkable.  No acute infiltrate or pulmonary edema.  A vascular stent is noted in the left subclavian region.    Post cholecystectomy surgical clips are noted.  No free abdominal air. Mild distended small bowel loops in the right lower abdomen suspicious for ileus or early bowel obstruction.  IMPRESSION: No active disease within chest.  Post cholecystectomy surgical clips are noted.  No free abdominal air.  Mild distended small bowel loops in right lower abdomen suspicious for ileus or early bowel obstruction.   Original Report Authenticated By: Natasha Mead, M.D.     Assessment & Plan: Pt is a 56 y.o. yo male with a PMHX of ESRD on HD as well as HTN, DM, and Hep C who was admitted to [facility] on 02/03/2012 with nausea and vomiting, and found to be hyperkalemic at admission to 7.5.   ESRD- Patient on TTS schedule at [facility]. Did not go today secondary to acute illness. He is due for treatment today. Patient states his dry weight is 73.5kg, and he is at 70kg today with no edema. - Hemodialysis now. Will plan to remove 2L. As tolerated - He is on Epogen three times weekly with dialysis - Also on Venofer 50mg  every Tuesday, which will continue as an outpatient. HgB 17.5 today. Will recheck in the AM - Last Phos was 8.2, PTH 174 on 01/23/12. He is on Renvela as an outpatient, but not sure if he is taking that at this time - Strict I/O   Hyperkalemia- Sample in lab did have hemolysis.  S/p calcium gluconate in ED, currently getting HD. Repeat K was 3.8. - Monitor on telemetry given new peaked Twaves - Repeat Bmet per primary team First K was hemolyzed, recheck was in the 3s.  Will just follow through this hospitalization  Nausea/Vomiting- Patient with N/V, abdominal pain and slightly elevated lipase. Could likely have SBO. [facility]  to admit and manage - Phenergan 25mg  IV q 4 hours prn nausea - Benadryl x1 now  HTN- Hypertensive to 200's/100's prior to starting HD treatment. Patient has not been on his medications as an outpatient since he states he cannot afford them - HD to remove 2 liters - Restart medication, per primary team  DM- Diet controlled  DVT PPX - SCD, per primary team  Anemia- actually hgb is high, no ESA for now, hgb 11.3 as OP. Question if is error, will reeval in AM  Bones- will give him renvela here.  No vitamin D , last PTH is 174  Dispo- HD today, which is his normally scheduled day. Will follow along and plan on another treatment Tuesday if still hospitalized, or as needed for acute indications (such as continued hyperkalemia.)  Thank you for this consult.  Amber M. Hairford, M.D. 02/03/2012 12:00 PM   Patient seen and examined, agree with above note with above modifications.  56 year old BM with ESRD- TTS [place] [facility].  He was compliant with treatment this week.  He now presents with abdominal pain and slightly elevated lipase and abdominal film suggestive of partial SBO.  Supportive care for this per primary team and we will continue on HD schedule. [name], MD 02/03/2012

## 2012-02-03 NOTE — ED Notes (Signed)
Pt taken to Dialysis unit via strecther

## 2012-02-03 NOTE — ED Notes (Signed)
Pt is due for dialysis today. Has recently moved her from [place] x 2 weeks ago. Has not had his regular meds in 2 weeks. Pt vomited multiple times this am. EMS gave 4mg  of Zofran IV. (pt refused IV in route)

## 2012-02-03 NOTE — Progress Notes (Signed)
Dispo Note  Called by Dr. Bruce Donath to admit this patient. Has a h/o ESRD and dialyzes TTS. Also has a h/o pancreatitis and has an elevated lipase level. Says he did not report to his HD center today because of Nausea and vomiting all night. Was found to have a K of >7.5 and new peaked T waves on EKG. Was given calcium gluconate and renal was called for emergent HD. His AAS shows possibly an SBO, but there was not enough time to insert an NG prior to his going to HD. I have accepted this patient for admission.  Peggye Pitt, MD [facility] Pager: [phone]

## 2012-02-03 NOTE — Progress Notes (Signed)
Mr. Schnackenberg has become more agitated with his HD treatment as it goes along.  He is being combative.  I think this may be a combination of the benadryl/phenergan/ having his BP drop into the 130's which is relative hypotension for him.  His recheck K was all right and he has received most of his HD this week so will end treatment early.  Planning on checking labs in the morning.   Carolynne Schuchard A

## 2012-02-03 NOTE — H&P (Signed)
[facility]          History and Physical    PCP:   No primary provider on file.   Chief Complaint:  Nausea and vomiting  HPI: 56 y/o man with PMH significant for ESRD on HD TTS, HTN, DM has been having nausea and vomiting for 2 days. He has not been able to keep anything down. Was unable to go to his outpatient dialysis center today and came to the ED instead for evaluation. Was found to have a lipase of 129, an abdominal xray consistent with SBO and most importantly a K>7.5 (? Hemolyzed). Renal was called and was taken for emergent HD. I am seeing [name] in his room after his HD session. Repeat K is 3.8 (?to quick of a drop). He still has significant nausea and has projectile vomited twice since arrival to his room.  Allergies:  No Known Allergies    Past Medical History  Diagnosis Date  . Myocardial infarction   . Hypertension   . Diabetes mellitus   . Chronic kidney disease   . GERD (gastroesophageal reflux disease)     No past surgical history on file.  Prior to Admission medications   Not on File    Social History:  reports that he has been smoking Cigarettes.  He has a 10.75 pack-year smoking history. He has never used smokeless tobacco. He reports that he does not drink alcohol or use illicit drugs.  No family history on file.  Review of Systems:  Constitutional: Denies fever, chills, diaphoresis. HEENT: Denies photophobia, eye pain, redness, hearing loss, ear pain, congestion, sore throat, rhinorrhea, sneezing, mouth sores, trouble swallowing, neck pain, neck stiffness and tinnitus.   Respiratory: Denies SOB, DOE, cough, chest tightness,  and wheezing.   Cardiovascular: Denies chest pain, palpitations and leg swelling.  Gastrointestinal: Denies diarrhea, constipation, blood in stool and abdominal distention.  Genitourinary: Denies dysuria, urgency, frequency, hematuria, flank pain and difficulty urinating.  Musculoskeletal: Denies myalgias,  back pain, joint swelling, arthralgias and gait problem.  Skin: Denies pallor, rash and wound.  Neurological: Denies dizziness, seizures, syncope, weakness, light-headedness, numbness and headaches.  Hematological: Denies adenopathy. Easy bruising, personal or family bleeding history  Psychiatric/Behavioral: Denies suicidal ideation, mood changes, confusion, nervousness, sleep disturbance and agitation   Physical Exam: Blood pressure 171/107, pulse 111, temperature 96.9 F (36.1 C), temperature source Oral, resp. rate 20, weight 69 kg (152 lb 1.9 oz), SpO2 96.00%. Gen: AAOx3, in significant distress 2/2 nausea. In a dark room, will not open his eyes to my exam, looks acutely ill. Heent: [place]/AT/PERRL/EOMI. Neck: supple, no JVD, no LAD, no bruits, no goiter. CV: tachy, regular rhythm, no M/R/G. Lungs: CTA B. Abd: S/NT/ND/hypoactive BS/no masses or organomegaly noted. Ext: no C/C/E, +pedal pulses. Neuro: appears grossly intact and non-focal. Skin: no rashes, ulcerations identified on exam.  Labs on Admission:  Results for orders placed during the hospital encounter of 02/03/12 (from the past 48 hour(s))  CBC WITH DIFFERENTIAL     Status: Abnormal   Collection Time   02/03/12  9:05 AM      Component Value Range Comment   WBC 10.5  4.0 - 10.5 K/uL WHITE COUNT CONFIRMED ON SMEAR   RBC 5.59  4.22 - 5.81 MIL/uL    Hemoglobin 17.5 (*) 13.0 - 17.0 g/dL    HCT 69.6  29.5 - 28.4 %    MCV 88.7  78.0 - 100.0 fL    MCH 31.3  26.0 - 34.0 pg  MCHC 35.3  30.0 - 36.0 g/dL    RDW 33.2 (*) 95.1 - 15.5 %    Platelets 91 (*) 150 - 400 K/uL PLATELET COUNT CONFIRMED BY SMEAR   Neutrophils Relative 73  43 - 77 %    Lymphocytes Relative 8 (*) 12 - 46 %    Monocytes Relative 6  3 - 12 %    Eosinophils Relative 13 (*) 0 - 5 %    Basophils Relative 0  0 - 1 %    Band Neutrophils 0  0 - 10 %    Metamyelocytes Relative 0      Myelocytes 0      Promyelocytes Absolute 0      Blasts 0      nRBC 0  0 /100  WBC    Neutro Abs 7.7  1.7 - 7.7 K/uL    Lymphs Abs 0.8  0.7 - 4.0 K/uL    Monocytes Absolute 0.6  0.1 - 1.0 K/uL    Eosinophils Absolute 1.4 (*) 0.0 - 0.7 K/uL    Basophils Absolute 0.0  0.0 - 0.1 K/uL    Smear Review MORPHOLOGY UNREMARKABLE     COMPREHENSIVE METABOLIC PANEL     Status: Abnormal   Collection Time   02/03/12  9:05 AM      Component Value Range Comment   Sodium 132 (*) 135 - 145 mEq/L    Potassium >7.5 (*) 3.5 - 5.1 mEq/L    Chloride 86 (*) 96 - 112 mEq/L    CO2 26  19 - 32 mEq/L    Glucose, Bld 130 (*) 70 - 99 mg/dL    BUN 43 (*) 6 - 23 mg/dL    Creatinine, Ser 88.41 (*) 0.50 - 1.35 mg/dL    Calcium 66.0 (*) 8.4 - 10.5 mg/dL    Total Protein 63.0 (*) 6.0 - 8.3 g/dL    Albumin 4.6  3.5 - 5.2 g/dL    AST 94 (*) 0 - 37 U/L    ALT 53  0 - 53 U/L HEMOLYSIS AT THIS LEVEL MAY AFFECT RESULT   Alkaline Phosphatase 92  39 - 117 U/L HEMOLYSIS AT THIS LEVEL MAY AFFECT RESULT   Total Bilirubin 0.7  0.3 - 1.2 mg/dL HEMOLYSIS AT THIS LEVEL MAY AFFECT RESULT   GFR calc non Af Amer 5 (*) >90 mL/min    GFR calc Af Amer 6 (*) >90 mL/min   LIPASE, BLOOD     Status: Abnormal   Collection Time   02/03/12  9:05 AM      Component Value Range Comment   Lipase 129 (*) 11 - 59 U/L   LACTIC ACID, PLASMA     Status: Normal   Collection Time   02/03/12  9:05 AM      Component Value Range Comment   Lactic Acid, Venous 2.2  0.5 - 2.2 mmol/L   POTASSIUM     Status: Normal   Collection Time   02/03/12 11:10 AM      Component Value Range Comment   Potassium 3.8  3.5 - 5.1 mEq/L   GLUCOSE, CAPILLARY     Status: Abnormal   Collection Time   02/03/12  1:33 PM      Component Value Range Comment   Glucose-Capillary 144 (*) 70 - 99 mg/dL     Radiological Exams on Admission: Dg Abd Acute W/chest  02/03/2012  *RADIOLOGY REPORT*  Clinical Data: Abdominal pain, nausea, vomiting  ACUTE ABDOMEN SERIES (ABDOMEN 2 VIEW &  CHEST 1 VIEW)  Comparison: CT scan 02/14/2010  Findings: Cardiomediastinal  silhouette is unremarkable.  No acute infiltrate or pulmonary edema.  A vascular stent is noted in the left subclavian region.    Post cholecystectomy surgical clips are noted.  No free abdominal air. Mild distended small bowel loops in the right lower abdomen suspicious for ileus or early bowel obstruction.  IMPRESSION: No active disease within chest.  Post cholecystectomy surgical clips are noted.  No free abdominal air.  Mild distended small bowel loops in right lower abdomen suspicious for ileus or early bowel obstruction.   Original Report Authenticated By: Natasha Mead, M.D.     Assessment/Plan Principal Problem:  *Nausea & vomiting Active Problems:  Pancreatitis, acute  SBO (small bowel obstruction)  Hyperkalemia  ESRD (end stage renal disease)  HTN (hypertension)  DM type 2 (diabetes mellitus, type 2)   Nausea and Vomiting -Suspect related to SBO evidenced on acute abdominal series +/- acute pancreatitis with his elevated lipase. -Given he is still symptomatic, will place NG to intermittent suction and keep NPO for now. -Antiemetics as needed. -Advance diet as tolerated. -RUQ [place] to make sure no gallstones. -He denies ETOH use.  Hyperkalemia -Treated with emergent HD. -Repeat K is 3.8. -Will recheck K just to make sure.  HTN -Still hypertensive. -?Pain related. -Just had HD. -Will order PRN hydralazine.  DM Type II -Diet controlled. -Check A1C. -Check CBGs to determine if SSI needed.  DVT Prophylaxis -Lovenox.  Time Spent on Admission: 70 minutes  [name],[name] [facility] Pager: [phone] 02/03/2012, 3:52 PM

## 2012-02-03 NOTE — Procedures (Signed)
Patient was seen on dialysis and the procedure was supervised.  BFR 300  Via AVF BP is  180/74.   Patient appears to be tolerating treatment well  [name] A 02/03/2012

## 2012-02-03 NOTE — ED Provider Notes (Addendum)
History     CSN: [id number]  Arrival date & time 02/03/12  4098   First MD Initiated Contact with Patient 02/03/12 [phone]      Chief Complaint  Patient presents with  . Emesis    (Consider location/radiation/quality/duration/timing/severity/associated sxs/prior treatment) Patient is a 56 y.o. male presenting with vomiting. The history is provided by the patient.  Emesis    seen here for vomiting and nausea x24 hours. He was scheduled for dialysis today. Denies shortness of breath. Some abdominal crampy without pain. Denies any severe headaches or neck pain. No recent fever or chills. Called EMS and was given Zofran and transported here  No past medical history on file.  No past surgical history on file.  No family history on file.  History  Substance Use Topics  . Smoking status: Current Every Day Smoker  . Smokeless tobacco: Not on file  . Alcohol Use: No      Review of Systems  Gastrointestinal: Positive for vomiting.  All other systems reviewed and are negative.    Allergies  Review of patient's allergies indicates no known allergies.  Home Medications  No current outpatient prescriptions on file.  BP 189/108  Pulse 100  Temp 98.2 F (36.8 C)  Resp 20  Physical Exam  Nursing note and vitals reviewed. Constitutional: He is oriented to person, place, and time. He appears well-developed and well-nourished.  Non-toxic appearance. No distress.  HENT:  Head: Normocephalic and atraumatic.  Eyes: Conjunctivae normal, EOM and lids are normal. Pupils are equal, round, and reactive to light.  Neck: Normal range of motion. Neck supple. No tracheal deviation present. No mass present.  Cardiovascular: Normal rate, regular rhythm and normal heart sounds.  Exam reveals no gallop.   No murmur heard. Pulmonary/Chest: Effort normal and breath sounds normal. No stridor. No respiratory distress. He has no decreased breath sounds. He has no wheezes. He has no rhonchi. He has  no rales.  Abdominal: Soft. Normal appearance and bowel sounds are normal. He exhibits no distension. There is no tenderness. There is no rebound and no CVA tenderness.  Musculoskeletal: Normal range of motion. He exhibits no edema and no tenderness.  Neurological: He is alert and oriented to person, place, and time. He has normal strength. No cranial nerve deficit or sensory deficit. GCS eye subscore is 4. GCS verbal subscore is 5. GCS motor subscore is 6.  Skin: Skin is warm and dry. No abrasion and no rash noted.  Psychiatric: He has a normal mood and affect. His speech is normal and behavior is normal.    ED Course  Procedures (including critical care time)   Labs Reviewed  CBC WITH DIFFERENTIAL  COMPREHENSIVE METABOLIC PANEL  LIPASE, BLOOD  LACTIC ACID, PLASMA   No results found.   No diagnosis found.    MDM   Date: 02/03/2012  Rate: 56  Rhythm: sinus tachycardia  QRS Axis: normal  Intervals: QT prolonged  ST/T Wave abnormalities: peaked t-waves  Conduction Disutrbances:right bundle branch block  Narrative Interpretation:   Old EKG Reviewed: changes noted  She given calcium gluconate IV piggyback. Spoke with the dialysis doctor on call and patient to be admitted. Also patient likely has early pancreatitis, informed physician of this   CRITICAL CARE Performed by: Toy Baker   Total critical care time: 60  Critical care time was exclusive of separately billable procedures and treating other patients.  Critical care was necessary to treat or prevent imminent or life-threatening deterioration.  Critical care  was time spent personally by me on the following activities: development of treatment plan with patient and/or surrogate as well as nursing, discussions with consultants, evaluation of patient's response to treatment, examination of patient, obtaining history from patient or surrogate, ordering and performing treatments and interventions, ordering and review  of laboratory studies, ordering and review of radiographic studies, pulse oximetry and re-evaluation of patient's condition.        Toy Baker, MD 02/03/12 1027  Toy Baker, MD 02/03/12 1031

## 2012-02-04 LAB — HEMOGLOBIN A1C
Hgb A1c MFr Bld: 6.1 % — ABNORMAL HIGH (ref <5.7)
Mean Plasma Glucose: 128 mg/dL — ABNORMAL HIGH (ref <117)

## 2012-02-04 LAB — BASIC METABOLIC PANEL
BUN: 48 mg/dL — ABNORMAL HIGH (ref 6–23)
CO2: 28 mEq/L (ref 19–32)
Calcium: 11.5 mg/dL — ABNORMAL HIGH (ref 8.4–10.5)
Chloride: 84 mEq/L — ABNORMAL LOW (ref 96–112)
Creatinine, Ser: 10.03 mg/dL — ABNORMAL HIGH (ref 0.50–1.35)
GFR calc Af Amer: 6 mL/min — ABNORMAL LOW (ref >90)
GFR calc non Af Amer: 5 mL/min — ABNORMAL LOW (ref >90)
Glucose, Bld: 187 mg/dL — ABNORMAL HIGH (ref 70–99)
Potassium: 5.3 mEq/L — ABNORMAL HIGH (ref 3.5–5.1)
Sodium: 145 mEq/L (ref 135–145)

## 2012-02-04 LAB — GLUCOSE, CAPILLARY
Glucose-Capillary: 205 mg/dL — ABNORMAL HIGH (ref 70–99)
Glucose-Capillary: 151 mg/dL — ABNORMAL HIGH (ref 70–99)

## 2012-02-04 LAB — CBC
HCT: 57.3 % — ABNORMAL HIGH (ref 39.0–52.0)
Hemoglobin: 19.7 g/dL — ABNORMAL HIGH (ref 13.0–17.0)
MCH: 31 pg (ref 26.0–34.0)
MCHC: 34.4 g/dL (ref 30.0–36.0)
MCV: 90.2 fL (ref 78.0–100.0)
Platelets: 139 10*3/uL — ABNORMAL LOW (ref 150–400)
RBC: 6.35 MIL/uL — ABNORMAL HIGH (ref 4.22–5.81)
RDW: 18 % — ABNORMAL HIGH (ref 11.5–15.5)
WBC: 15.9 10*3/uL — ABNORMAL HIGH (ref 4.0–10.5)

## 2012-02-04 LAB — MRSA PCR SCREENING: MRSA by PCR: NEGATIVE

## 2012-02-04 MED ORDER — ZOLPIDEM TARTRATE 5 MG PO TABS: 5.0000 mg | ORAL_TABLET | Freq: Every evening | ORAL | Status: DC | PRN

## 2012-02-04 MED ORDER — LABETALOL HCL 5 MG/ML IV SOLN
10.0000 mg | INTRAVENOUS | Status: DC | PRN
  Administered 2012-02-06: 10 mg via INTRAVENOUS
  Filled 2012-02-04: qty 4

## 2012-02-04 MED ORDER — SODIUM CHLORIDE 0.45 % IV SOLN
INTRAVENOUS | Status: DC
  Administered 2012-02-04 (×2): via INTRAVENOUS
  Administered 2012-02-05: 1000 mL via INTRAVENOUS
  Administered 2012-02-06: 05:00:00 via INTRAVENOUS

## 2012-02-04 MED ORDER — ACETAMINOPHEN 325 MG PO TABS: 650.0000 mg | ORAL_TABLET | Freq: Four times a day (QID) | ORAL | Status: DC | PRN

## 2012-02-04 MED ORDER — CAMPHOR-MENTHOL 0.5-0.5 % EX LOTN: 1.0000 "application " | TOPICAL_LOTION | Freq: Three times a day (TID) | CUTANEOUS | Status: DC | PRN

## 2012-02-04 MED ORDER — ONDANSETRON HCL 4 MG/2ML IJ SOLN
4.0000 mg | Freq: Four times a day (QID) | INTRAMUSCULAR | Status: DC | PRN
  Filled 2012-02-04: qty 2

## 2012-02-04 MED ORDER — ACETAMINOPHEN 650 MG RE SUPP: 650.0000 mg | Freq: Four times a day (QID) | RECTAL | Status: DC | PRN

## 2012-02-04 MED ORDER — INSULIN ASPART 100 UNIT/ML ~~LOC~~ SOLN
0.0000 [IU] | Freq: Three times a day (TID) | SUBCUTANEOUS | Status: DC
  Administered 2012-02-04: 2 [IU] via SUBCUTANEOUS
  Administered 2012-02-04: 3 [IU] via SUBCUTANEOUS
  Administered 2012-02-05 (×2): 1 [IU] via SUBCUTANEOUS
  Administered 2012-02-06: 09:00:00 via SUBCUTANEOUS
  Administered 2012-02-07: 1 [IU] via SUBCUTANEOUS

## 2012-02-04 MED ORDER — SORBITOL 70 % SOLN: 30.0000 mL | Status: DC | PRN

## 2012-02-04 MED ORDER — NEPRO/CARBSTEADY PO LIQD: 237.0000 mL | Freq: Three times a day (TID) | ORAL | Status: DC | PRN

## 2012-02-04 MED ORDER — HYDROXYZINE HCL 25 MG PO TABS: 25.0000 mg | ORAL_TABLET | Freq: Three times a day (TID) | ORAL | Status: DC | PRN

## 2012-02-04 MED ORDER — CALCIUM CARBONATE 1250 MG/5ML PO SUSP: 500.0000 mg | Freq: Four times a day (QID) | ORAL | Status: DC | PRN

## 2012-02-04 MED ORDER — LORAZEPAM 2 MG/ML IJ SOLN
0.5000 mg | Freq: Three times a day (TID) | INTRAMUSCULAR | Status: DC | PRN
  Administered 2012-02-04 – 2012-02-07 (×5): 0.5 mg via INTRAVENOUS
  Filled 2012-02-04 (×6): qty 1

## 2012-02-04 MED ORDER — DOCUSATE SODIUM 283 MG RE ENEM: 1.0000 | ENEMA | RECTAL | Status: DC | PRN

## 2012-02-04 MED ORDER — ONDANSETRON HCL 4 MG PO TABS: 4.0000 mg | ORAL_TABLET | Freq: Four times a day (QID) | ORAL | Status: DC | PRN

## 2012-02-04 NOTE — Progress Notes (Signed)
Noted blood to pt tissue when wiping post stool. MD, Dr. Ardyth Harps,, notified via text page with call back. To continue monitoring and ordered hgb recheck in the am.

## 2012-02-04 NOTE — Progress Notes (Signed)
[facility] Progress Note  Subjective:  Wants water.  Wants benadryl to sleep. "I'm dehydrated".  In spite of being uncomfortable, he feels better with NG in.  Objective Filed Vitals:   02/03/12 1737 02/03/12 2018 02/04/12 0030 02/04/12 0607  BP: 193/124 173/105 174/104 115/74  Pulse: 121 120  123  Temp: 98 F (36.7 C) 98.2 F (36.8 C)  97.8 F (36.6 C)  TempSrc: Oral Oral  Oral  Resp: 20 18  18   Height:  5\' 10"  (1.778 m)    Weight:  69.174 kg (152 lb 8 oz)    SpO2: 98% 99%  96%   Physical Exam General: Thin, uncomfortable, irritable with NG draining dark liquid Heart:tachy regular Lungs: no wheezes or rales Abdomen: soft diminished BS I do not hear any bowel sounds at this time Extremities: no edema Dialysis Access: left upper AVF  Dialysis orders:  [facility] 3.75  hr EDQW 73.5 2K 2.5 Ca 500/800 No Na modeling heparin 2400 Epo 4400 Venofer 50/Th no Zemplar Optiflux 180  Last Hgb 11.3 8/10 iPTH 174 26% sat and ferritin 285   Assessment/Plan: 1. N, V, elevated lipase and early SBO per xray - NG in place, NPO  2. ESRD - with hyperkalemia; emergent HD yesterday; post istat K ok yesterday;  Will recheck BMP to see what K is s/p re-equilibration  3. Anemia - Hgb markedly increased - likely due to hemoconcentration; recheck today.   4. Secondary hyperparathyroidism - controlled without vit D.  Was given samples of renvela 9/19 plus Rx  5. HTN/volume - Out pt records show that he is very hypertensive with gains of 3-4.5kg.  Pre HD BP 217/1005 to approx 200/100 -155/100 post without drops during treatment. Heart rate usually in the 60s. Had been able to get to EDW  SW note from his center 9/19 ndicates that he had not yet gone to [facility] to transfer his medicad benefits from [place] to [place] (started HD here 9/10).  He doesn't have medications and told the SW he didn't have any money to buy them anyway.  He was given Rxs 9/14.  Outpt meds: norvasc 10, coreg 25 on HD days,  lisinopril 40 bid to take the am of HD , clonidine 0.3 at HS bid .  Today he is almost 4-4.5 kg below EDW and tachycardic.  I'm not sure if last BP is real.  Plan start IV prn labetalol and give IVF at 75 hr and re-evaluate volume status in the am;    6. Nutrition - NPO   7 Hep c + with hx of cirrhosis - increased AST; no other labs to compare with 9. Depression with hx suicide attempt - clearly miserable today; add prn IV ativan 10. Thrombocytopenia - platelets 112K 9/10; lower now - follow  Sheffield Slider, PA-C [facility] Beeper [phone]  02/04/2012,8:03 AM  LOS: 1 day  I have seen and examined this patient and agree with plans and assessments as outlined/highlighted above. I note primary plans to clamp NG and allow clears.  Suspect he won't tolerate with very quiet abdomen and large volumes of NG drainage.  Looks dry.  Giving some slow IVF as is very much below EDW and tachy.  IV labetolol prn for BP/tachycardia Elwyn Lowden B,MD 02/04/2012 11:39 AM   Additional Objective Labs: Basic Metabolic Panel:  Lab 02/03/12 1914 02/03/12 1110 02/03/12 0905  NA -- -- 132*  K 4.0 3.8 >7.5*  CL -- -- 86*  CO2 -- --  26  GLUCOSE -- -- 130*  BUN -- -- 43*  CREATININE 7.13* -- 10.09*  CALCIUM -- -- 11.1*  ALB -- -- --  PHOS -- -- --   Liver Function Tests:  Lab 02/03/12 0905  AST 94*  ALT 53  ALKPHOS 92  BILITOT 0.7  PROT 10.3*  ALBUMIN 4.6    Lab 02/03/12 0905  LIPASE 129*  AMYLASE --   CBC:  Lab 02/03/12 1618 02/03/12 0905  WBC 11.5* 10.5  NEUTROABS -- 7.7  HGB 18.4* 17.5*  HCT 53.2* 49.6  MCV 90.6 88.7  PLT 89* 91*  CBG:  Lab 02/04/12 0554 02/04/12 0020 02/03/12 1733 02/03/12 1333  GLUCAP 185* 155* 155* 144*  Studies/Results: Dg Abd Acute W/chest  02/03/2012  *RADIOLOGY REPORT*  Clinical Data: Abdominal pain, nausea, vomiting  ACUTE ABDOMEN SERIES (ABDOMEN 2 VIEW & CHEST 1 VIEW)  Comparison: CT scan 02/14/2010  Findings: Cardiomediastinal  silhouette is unremarkable.  No acute infiltrate or pulmonary edema.  A vascular stent is noted in the left subclavian region.    Post cholecystectomy surgical clips are noted.  No free abdominal air. Mild distended small bowel loops in the right lower abdomen suspicious for ileus or early bowel obstruction.  IMPRESSION: No active disease within chest.  Post cholecystectomy surgical clips are noted.  No free abdominal air.  Mild distended small bowel loops in right lower abdomen suspicious for ileus or early bowel obstruction.   Original Report Authenticated By: Natasha Mead, M.D.    US Abdomen Limited Ruq  02/03/2012  *RADIOLOGY REPORT*  Clinical Data: Pancreatitis.  Evaluate for gallstones.  Post cholecystectomy.  Nausea and vomiting.  LIMITED ABDOMINAL ULTRASOUND  Comparison:  02/14/2010 CT.  Findings:  Gallbladder:  Post cholecystectomy.  Common bile duct:  4.6 mm tapering to 2.2 mm.  No stone visualized.  Liver:  Heterogeneous suggestive of fatty infiltration without focal mass or intrahepatic biliary duct dilatation.  IMPRESSION: No common bile duct stone noted in this patient who is status post cholecystectomy.  Suspect fatty liver.   Original Report Authenticated By: Fuller Canada, M.D.    Medications:    . DISCONTD: sodium chloride        . diphenhydrAMINE  25 mg Intravenous Once  . diphenhydrAMINE  25 mg Intravenous Once  . enoxaparin (LOVENOX) injection  30 mg Subcutaneous Q24H  . LORazepam  0.5 mg Intravenous Once  . ondansetron      . ondansetron      . promethazine  25 mg Intramuscular Once  . sodium chloride  3 mL Intravenous Q12H  . DISCONTD: calcium gluconate 1 GM IV  1 g Intravenous Once  . DISCONTD: promethazine  25 mg Intravenous Once  . DISCONTD: sevelamer  1,600 mg Oral TID WC

## 2012-02-04 NOTE — Progress Notes (Signed)
[facility]             Progress Note   Subjective: Abdominal Pain improved. Main complaint today is back pain. Wants ice chips and clears. Moderate to large amount of NG drainage in canister (nothing recorded in Is and Os).  Objective: Vital signs in last 24 hours: Temp:  [96.9 F (36.1 C)-98.2 F (36.8 C)] 97.7 F (36.5 C) (09/22 0941) Pulse Rate:  [86-130] 117  (09/22 0941) Resp:  [12-26] 18  (09/22 0941) BP: (102-202)/(70-124) 102/70 mmHg (09/22 0941) SpO2:  [96 %-100 %] 100 % (09/22 0941) Weight:  [69 kg (152 lb 1.9 oz)-70.1 kg (154 lb 8.7 oz)] 69.174 kg (152 lb 8 oz) (09/21 2018) Weight change:  Last BM Date: 02/02/12  Intake/Output from previous day: 09/21 0701 - 09/22 0700 In: 0  Out: 3384      Physical Exam: General: Alert, awake, oriented x3. HEENT: No bruits, no goiter. Heart: Regular rate and rhythm, without murmurs, rubs, gallops. Lungs: Clear to auscultation bilaterally. Abdomen: Soft, nontender, nondistended, positive bowel sounds. Extremities: No clubbing cyanosis or edema with positive pedal pulses. Neuro: Grossly intact, nonfocal.    Lab Results: Basic Metabolic Panel:  Basename 02/03/12 1835 02/03/12 1110 02/03/12 0905  NA -- -- 132*  K 4.0 3.8 --  CL -- -- 86*  CO2 -- -- 26  GLUCOSE -- -- 130*  BUN -- -- 43*  CREATININE 7.13* -- 10.09*  CALCIUM -- -- 11.1*  MG -- -- --  PHOS -- -- --   Liver Function Tests:  [facility] 02/03/12 0905  AST 94*  ALT 53  ALKPHOS 92  BILITOT 0.7  PROT 10.3*  ALBUMIN 4.6    Basename 02/03/12 0905  LIPASE 129*  AMYLASE --   CBC:  Basename 02/03/12 1618 02/03/12 0905  WBC 11.5* 10.5  NEUTROABS -- 7.7  HGB 18.4* 17.5*  HCT 53.2* 49.6  MCV 90.6 88.7  PLT 89* 91*   CBG:  Basename 02/04/12 0554 02/04/12 0020 02/03/12 1733 02/03/12 1333  GLUCAP 185* 155* 155* 144*   Hemoglobin A1C:  Basename 02/03/12 1618  HGBA1C 6.1*   Urine Drug Screen: Drugs of Abuse     Component Value  Date/Time   LABOPIA POSITIVE* 02/08/2010 0102   COCAINSCRNUR NONE DETECTED 02/08/2010 0102   LABBENZ NONE DETECTED 02/08/2010 0102   AMPHETMU NONE DETECTED 02/08/2010 0102   THCU NONE DETECTED 02/08/2010 0102   LABBARB  Value: NONE DETECTED        DRUG SCREEN FOR MEDICAL PURPOSES ONLY.  IF CONFIRMATION IS NEEDED FOR ANY PURPOSE, NOTIFY LAB WITHIN 5 DAYS.        LOWEST DETECTABLE LIMITS FOR URINE DRUG SCREEN Drug Class       Cutoff (ng/mL) Amphetamine      1000 Barbiturate      200 Benzodiazepine   200 Tricyclics       300 Opiates          300 Cocaine          300 THC              50 02/08/2010 0102     Studies/Results: Dg Abd Acute W/chest  02/03/2012  *RADIOLOGY REPORT*  Clinical Data: Abdominal pain, nausea, vomiting  ACUTE ABDOMEN SERIES (ABDOMEN 2 VIEW & CHEST 1 VIEW)  Comparison: CT scan 02/14/2010  Findings: Cardiomediastinal silhouette is unremarkable.  No acute infiltrate or pulmonary edema.  A vascular stent is noted in the left subclavian region.  Post cholecystectomy surgical clips are noted.  No free abdominal air. Mild distended small bowel loops in the right lower abdomen suspicious for ileus or early bowel obstruction.  IMPRESSION: No active disease within chest.  Post cholecystectomy surgical clips are noted.  No free abdominal air.  Mild distended small bowel loops in right lower abdomen suspicious for ileus or early bowel obstruction.   Original Report Authenticated By: Natasha Mead, M.D.    US Abdomen Limited Ruq  02/03/2012  *RADIOLOGY REPORT*  Clinical Data: Pancreatitis.  Evaluate for gallstones.  Post cholecystectomy.  Nausea and vomiting.  LIMITED ABDOMINAL ULTRASOUND  Comparison:  02/14/2010 CT.  Findings:  Gallbladder:  Post cholecystectomy.  Common bile duct:  4.6 mm tapering to 2.2 mm.  No stone visualized.  Liver:  Heterogeneous suggestive of fatty infiltration without focal mass or intrahepatic biliary duct dilatation.  IMPRESSION: No common bile duct stone noted in this patient  who is status post cholecystectomy.  Suspect fatty liver.   Original Report Authenticated By: Fuller Canada, M.D.     Medications: Scheduled Meds:   . diphenhydrAMINE  25 mg Intravenous Once  . diphenhydrAMINE  25 mg Intravenous Once  . enoxaparin (LOVENOX) injection  30 mg Subcutaneous Q24H  . LORazepam  0.5 mg Intravenous Once  . ondansetron      . promethazine  25 mg Intramuscular Once  . sodium chloride  3 mL Intravenous Q12H  . DISCONTD: calcium gluconate 1 GM IV  1 g Intravenous Once  . DISCONTD: promethazine  25 mg Intravenous Once  . DISCONTD: sevelamer  1,600 mg Oral TID WC   Continuous Infusions:   . sodium chloride    . DISCONTD: sodium chloride     PRN Meds:.acetaminophen, docusate sodium, hydrALAZINE, labetalol, LORazepam, morphine injection, ondansetron (ZOFRAN) IV, sorbitol, DISCONTD: acetaminophen, DISCONTD: calcium carbonate (dosed in mg elemental calcium), DISCONTD: camphor-menthol, DISCONTD: feeding supplement (NEPRO CARB STEADY), DISCONTD: heparin, DISCONTD: heparin, DISCONTD: hydrOXYzine, DISCONTD: ondansetron (ZOFRAN) IV, DISCONTD: ondansetron, DISCONTD: ondansetron, DISCONTD: promethazine DISCONTD: zolpidem  Assessment/Plan:  Principal Problem:  *Nausea & vomiting Active Problems:  Pancreatitis, acute  SBO (small bowel obstruction)  Hyperkalemia  ESRD (end stage renal disease)  HTN (hypertension)  DM type 2 (diabetes mellitus, type 2)   Nausea and Vomiting -Suspect 2/2 SBO vs pancreatitis (more likely SBO with amount of NG drainage). -He wants to try clears, so will clamp NG and allow clears, if tolerates can DC NG later today. -RUQ US shows no CBD stones and he is s/p cholecystectomy.  Hyperkalemia -Had emergent HD yesterday for a K >7.5. -Repeat K 4.0.  HTN -Improved on meds. -Makes me question his compliance at home. -Per renal notes he always has BPs in the 200s upon arrival to his outpatient HD. -Will ask CM to see if he has financial  difficulties obtaining his meds.  DM Type II -A1C 6.1. -Diet controlled at home. -CBGs have been in the 150s-190s. -Will start SSI while in the hospital. -May benefit from outpatient treatment as well.  DVT Prophylaxis -Lovenox.  Time spent coordinating care: 35 minutes.   LOS: 1 day   [facility] Pager: [phone] 02/04/2012, 10:22 AM

## 2012-02-04 NOTE — Progress Notes (Signed)
Pt continues to attempt to get oob unsupervised secondary to bowel urgency. Pt noted to have pulled out NG tube  During attempt. MD notified via text page with call back. OK to leave out. Dondra Spry

## 2012-02-05 ENCOUNTER — Inpatient Hospital Stay (HOSPITAL_COMMUNITY): Payer: Medicare Other

## 2012-02-05 LAB — RENAL FUNCTION PANEL
Albumin: 4.1 g/dL (ref 3.5–5.2)
Chloride: 78 mEq/L — ABNORMAL LOW (ref 96–112)
GFR calc Af Amer: 6 mL/min — ABNORMAL LOW (ref >90)
GFR calc non Af Amer: 5 mL/min — ABNORMAL LOW (ref >90)
Phosphorus: 9.1 mg/dL — ABNORMAL HIGH (ref 2.3–4.6)
Potassium: 5.3 mEq/L — ABNORMAL HIGH (ref 3.5–5.1)
Sodium: 135 mEq/L (ref 135–145)

## 2012-02-05 LAB — CBC
MCH: 31.4 pg (ref 26.0–34.0)
MCHC: 34.2 g/dL (ref 30.0–36.0)
MCV: 91.8 fL (ref 78.0–100.0)
Platelets: 107 10*3/uL — ABNORMAL LOW (ref 150–400)
RBC: 5.95 MIL/uL — ABNORMAL HIGH (ref 4.22–5.81)
RDW: 17.9 % — ABNORMAL HIGH (ref 11.5–15.5)

## 2012-02-05 LAB — GLUCOSE, CAPILLARY: Glucose-Capillary: 124 mg/dL — ABNORMAL HIGH (ref 70–99)

## 2012-02-05 MED ORDER — METOCLOPRAMIDE HCL 5 MG/ML IJ SOLN
5.0000 mg | Freq: Once | INTRAMUSCULAR | Status: AC
  Administered 2012-02-05: 5 mg via INTRAVENOUS
  Filled 2012-02-05 (×2): qty 1

## 2012-02-05 MED ORDER — HEPARIN SODIUM (PORCINE) 1000 UNIT/ML DIALYSIS
20.0000 [IU]/kg | INTRAMUSCULAR | Status: DC | PRN
  Filled 2012-02-05: qty 2

## 2012-02-05 MED ORDER — METRONIDAZOLE 500 MG PO TABS
500.0000 mg | ORAL_TABLET | Freq: Three times a day (TID) | ORAL | Status: DC
  Administered 2012-02-05 – 2012-02-08 (×9): 500 mg via ORAL
  Filled 2012-02-05 (×12): qty 1

## 2012-02-05 NOTE — Progress Notes (Addendum)
Subjective:   Mild abdominal discomfort, no recent vomiting, poor appetite; NG removed.  Objective: Vital signs in last 24 hours: Temp:  [97 F (36.1 C)-97.5 F (36.4 C)] 97.4 F (36.3 C) (09/23 0921) Pulse Rate:  [97-121] 97  (09/23 0921) Resp:  [18] 18  (09/23 0921) BP: (95-118)/(41-82) 117/41 mmHg (09/23 0921) SpO2:  [94 %-100 %] 97 % (09/23 0921) Weight:  [66.361 kg (146 lb 4.8 oz)] 66.361 kg (146 lb 4.8 oz) (09/22 2039) Weight change: -3.739 kg (-8 lb 3.9 oz)  Intake/Output from previous day: 09/22 0701 - 09/23 0700 In: 2502.5 [P.O.:1050; I.V.:1452.5] Out: 650 [Stool:50]   EXAM: General appearance:  Alert, in no apparent distress, flat neck veins Resp:  CTA without rales, rhonchi, or wheezes Cardio: Slight tachycardia, without murmur  GI:  active BS, soft, nontender  Extremities:  No edema Access:  AVF @ LUA with + bruit  Lab Results:  Basename 02/04/12 1040 02/03/12 1618  WBC 15.9* 11.5*  HGB 19.7* 18.4*  HCT 57.3* 53.2*  PLT 139* 89*   BMET:  Basename 02/04/12 1040 02/03/12 1835 02/03/12 0905  NA 145 -- 132*  K 5.3* 4.0 --  CL 84* -- 86*  CO2 28 -- 26  GLUCOSE 187* -- 130*  BUN 48* -- 43*  CREATININE 10.03* 7.13* --  CALCIUM 11.5* -- 11.1*  ALBUMIN -- -- 4.6   No results found for this basename: PTH:2 in the last 72 hours Iron Studies: No results found for this basename: IRON,TIBC,TRANSFERRIN,FERRITIN in the last 72 hours  Dialysis orders: [facility] 3.75 hr EDQW 73.5 2K 2.5 Ca 500/800 No Na modeling heparin 2400 Epo 4400 Venofer 50/Th no Zemplar  Optiflux 180 Last Hgb 11.3 8/10 iPTH 174 26% sat and ferritin 285   Assessment/Plan: 1. Nausea/vomiting- now has diarrhea and rising WBC, ?Cdif. Stool for Cdif ordered.  2. ESRD - HD on TTS @ [place], K 5.3; started HD in [place], transferred here 9/10.  Next HD tomorrow. 3. Anemia - Hgb marked high, Hgb 19.7. 4. Secondary hyperparathyroidism - Ca 11.5, Alb 4.6, P 2.8, no Zemplar. 5. HTN/Volume - better with vol  down and IV hydralazine x 1.  Outpt meds include Norvasc, Coreg, Lisinopril, and Clonidine; wt yesterday 66.4 kg, but outpt EDW was 73.5 kg. No vol off with next HD 6. Nutrition - NPO. 7. Hepatitis C with Hx cirrhosis - increased AST. 8. Thrombocytopenia - Plts down to 91 (112 on 9/10).   LOS: 2 days   LYLES,CHARLES 02/05/2012,9:57 AM  Patient seen and examined and agree with assessment and plan as above with additions as indicated.  [name]  MD [employer] [phone] pgr    [phone] cell 02/05/2012, 11:56 AM

## 2012-02-05 NOTE — Progress Notes (Signed)
[facility]             Progress Note   Subjective: Abdominal Pain improved. Afebrile; tolerating CLD now; still with some nausea. Since yesterday has had more than 7 episodes of loose stool.  Objective: Vital signs in last 24 hours: Temp:  [97 F (36.1 C)-98.4 F (36.9 C)] 98.4 F (36.9 C) (09/23 1419) Pulse Rate:  [97-119] 103  (09/23 1419) Resp:  [18] 18  (09/23 1419) BP: (95-118)/(41-82) 110/69 mmHg (09/23 1419) SpO2:  [94 %-97 %] 97 % (09/23 1419) Weight:  [66.361 kg (146 lb 4.8 oz)] 66.361 kg (146 lb 4.8 oz) (09/22 2039) Weight change: -3.739 kg (-8 lb 3.9 oz) Last BM Date: 02/05/12  Intake/Output from previous day: 09/22 0701 - 09/23 0700 In: 2502.5 [P.O.:1050; I.V.:1452.5] Out: 650 [Stool:50] Total I/O In: 120 [P.O.:120] Out: -    Physical Exam: General: Alert, awake, oriented x3. HEENT: No bruits, no goiter. Heart: Regular rate and rhythm, without murmurs, rubs, gallops. Lungs: Clear to auscultation bilaterally. Abdomen: Soft, nontender, nondistended, positive bowel sounds. Extremities: No clubbing cyanosis or edema with positive pedal pulses. Neuro: Grossly intact, nonfocal.  Lab Results: Basic Metabolic Panel:  Basename 02/05/12 0730 02/04/12 1040  NA 135 145  K 5.3* 5.3*  CL 78* 84*  CO2 24 28  GLUCOSE 146* 187*  BUN 75* 48*  CREATININE 10.58* 10.03*  CALCIUM 9.2 11.5*  MG -- --  PHOS 9.1* --   Liver Function Tests:  Basename 02/05/12 0730 02/03/12 0905  AST -- 94*  ALT -- 53  ALKPHOS -- 92  BILITOT -- 0.7  PROT -- 10.3*  ALBUMIN 4.1 4.6    Basename 02/03/12 0905  LIPASE 129*  AMYLASE --   CBC:  Basename 02/05/12 0730 02/04/12 1040 02/03/12 0905  WBC 17.8* 15.9* --  NEUTROABS -- -- 7.7  HGB 18.7* 19.7* --  HCT 54.6* 57.3* --  MCV 91.8 90.2 --  PLT 107* 139* --   CBG:  Basename 02/05/12 1202 02/05/12 0616 02/05/12 0024 02/04/12 1624 02/04/12 1333 02/04/12 1147  GLUCAP 142* 133* 127* 151* 205* 169*    Hemoglobin A1C:  Basename 02/03/12 1618  HGBA1C 6.1*   Urine Drug Screen: Drugs of Abuse     Component Value Date/Time   LABOPIA POSITIVE* 02/08/2010 0102   COCAINSCRNUR NONE DETECTED 02/08/2010 0102   LABBENZ NONE DETECTED 02/08/2010 0102   AMPHETMU NONE DETECTED 02/08/2010 0102   THCU NONE DETECTED 02/08/2010 0102   LABBARB  Value: NONE DETECTED        DRUG SCREEN FOR MEDICAL PURPOSES ONLY.  IF CONFIRMATION IS NEEDED FOR ANY PURPOSE, NOTIFY LAB WITHIN 5 DAYS.        LOWEST DETECTABLE LIMITS FOR URINE DRUG SCREEN Drug Class       Cutoff (ng/mL) Amphetamine      1000 Barbiturate      200 Benzodiazepine   200 Tricyclics       300 Opiates          300 Cocaine          300 THC              50 02/08/2010 0102     Studies/Results: US Abdomen Limited Ruq  02/03/2012  *RADIOLOGY REPORT*  Clinical Data: Pancreatitis.  Evaluate for gallstones.  Post cholecystectomy.  Nausea and vomiting.  LIMITED ABDOMINAL ULTRASOUND  Comparison:  02/14/2010 CT.  Findings:  Gallbladder:  Post cholecystectomy.  Common bile duct:  4.6 mm tapering to 2.2 mm.  No stone visualized.  Liver:  Heterogeneous suggestive of fatty infiltration without focal mass or intrahepatic biliary duct dilatation.  IMPRESSION: No common bile duct stone noted in this patient who is status post cholecystectomy.  Suspect fatty liver.   Original Report Authenticated By: Fuller Canada, M.D.     Medications: Scheduled Meds:    . enoxaparin (LOVENOX) injection  30 mg Subcutaneous Q24H  . insulin aspart  0-9 Units Subcutaneous TID WC  . sodium chloride  3 mL Intravenous Q12H   Continuous Infusions:    . sodium chloride 1,000 mL (02/05/12 1317)   PRN Meds:.acetaminophen, docusate sodium, heparin, hydrALAZINE, labetalol, LORazepam, morphine injection, ondansetron (ZOFRAN) IV, sorbitol  Assessment/Plan:  Principal Problem:  *Nausea & vomiting Active Problems:  Pancreatitis, acute  SBO (small bowel obstruction)  Hyperkalemia  ESRD  (end stage renal disease)  HTN (hypertension)  DM type 2 (diabetes mellitus, type 2)   Nausea and Vomiting -Suspect 2/2 SBO vs pancreatitis (more likely SBO with amount of NG drainage). -No further vomiting; still feeling nauseated. -Patient removed NGT yesterday on his own. - US shows no CBD stones and he is s/p cholecystectomy. -Will repeat abd x-ray and also lipase level; continue CLD for now.  Hyperkalemia -Had emergent HD on 02/03/12 for a K >7.5. -K  5.3 today -Plan is for HD tomorrow; renal service to adjust K bath during HD.  HTN -Improved on meds. -Makes me question his compliance at home. -Per renal notes he always has BPs in the 200s upon arrival to his outpatient HD. -Continue current regimen  DM Type II -A1C 6.1. -Diet controlled at home. -CBGs have been in the 150s-190s. -Continue SSI while in the hospital.  Diarrhea -Most likely secondary to resolution of partial SBO with NGT and decompression  -But with concerns for c. Diff; will check C. Diff by PCR.  DVT Prophylaxis -Cont Lovenox.  Time spent coordinating care: 35 minutes.   LOS: 2 days   Delenn Ahn [facility] Pager: [phone] 02/05/2012, 2:56 PM

## 2012-02-05 NOTE — Progress Notes (Addendum)
Noted referral for assistance with medication and info re need for [place] medicaid. Pt has resided in [place] for several years and has had adequate opportunities to apply for [place] medicaid. Case manager is unable to assist with meds as pt has insurance. Will continue to follow.  Johny Shock RN MPH Case manager [phone]     CARE MANAGEMENT NOTE 02/05/2012  Patient:  [name], [name]   Account Number:  [id number]  Date Initiated:  02/05/2012  Documentation initiated by:  Rasean Joos  Subjective/Objective Assessment:   Request for assistance with medication and need for [place] Medicaid. Pt has been in [place] for several years. Unknown as to why he has not attempted to change [facility] to [facility]     Action/Plan:   CM unable to provide medication as pt has insurance.   Anticipated DC Date:  02/08/2012   Anticipated DC Plan:  HOME/SELF CARE         Choice offered to / List presented to:             Status of service:  In process, will continue to follow Medicare Important Message given?   (If response is "NO", the following Medicare IM given date fields will be blank) Date Medicare IM given:   Date Additional Medicare IM given:    Discharge Disposition:    Per UR Regulation:    If discussed at Long Length of Stay Meetings, dates discussed:    Comments:  02/05/2012 Met with pt and two sisters as well as one brother, all are concerned for this pts wellbing and are able to assist him in changing his Medicaid to , changing his SS benefits and are asking for assistance in obtaining housing for this pt. Pt currently lives with their father, but will need individual housing. CSW informed and will speak with family. Johny Shock RN MPH Case Manager [phone]

## 2012-02-05 NOTE — Progress Notes (Signed)
C-Diff stool resulted positive. Pt already placed on Contact precautions (BROWN). Dr. Gwenlyn Perking notified via text page. [place], Rosanna Randy

## 2012-02-06 ENCOUNTER — Inpatient Hospital Stay (HOSPITAL_COMMUNITY): Payer: Medicare Other

## 2012-02-06 DIAGNOSIS — A0472 Enterocolitis due to Clostridium difficile, not specified as recurrent: Principal | ICD-10-CM

## 2012-02-06 LAB — CBC
MCH: 30.6 pg (ref 26.0–34.0)
Platelets: 93 10*3/uL — ABNORMAL LOW (ref 150–400)
RBC: 4.61 MIL/uL (ref 4.22–5.81)

## 2012-02-06 LAB — GLUCOSE, CAPILLARY: Glucose-Capillary: 130 mg/dL — ABNORMAL HIGH (ref 70–99)

## 2012-02-06 LAB — RENAL FUNCTION PANEL
CO2: 26 mEq/L (ref 19–32)
Calcium: 7.4 mg/dL — ABNORMAL LOW (ref 8.4–10.5)
GFR calc Af Amer: 4 mL/min — ABNORMAL LOW (ref >90)
GFR calc non Af Amer: 4 mL/min — ABNORMAL LOW (ref >90)
Phosphorus: 8.6 mg/dL — ABNORMAL HIGH (ref 2.3–4.6)
Potassium: 4.2 mEq/L (ref 3.5–5.1)
Sodium: 128 mEq/L — ABNORMAL LOW (ref 135–145)

## 2012-02-06 MED ORDER — DICYCLOMINE HCL 10 MG PO CAPS
10.0000 mg | ORAL_CAPSULE | Freq: Three times a day (TID) | ORAL | Status: DC
  Administered 2012-02-06 – 2012-02-08 (×6): 10 mg via ORAL
  Filled 2012-02-06 (×10): qty 1

## 2012-02-06 MED ORDER — DIPHENHYDRAMINE HCL 50 MG/ML IJ SOLN
12.5000 mg | Freq: Three times a day (TID) | INTRAMUSCULAR | Status: DC | PRN
  Administered 2012-02-06 – 2012-02-08 (×7): 12.5 mg via INTRAVENOUS
  Filled 2012-02-06 (×6): qty 1

## 2012-02-06 MED ORDER — DIPHENHYDRAMINE HCL 50 MG/ML IJ SOLN
INTRAMUSCULAR | Status: AC
  Administered 2012-02-06: 12.5 mg via INTRAVENOUS
  Filled 2012-02-06: qty 1

## 2012-02-06 MED ORDER — MORPHINE SULFATE 2 MG/ML IJ SOLN
1.0000 mg | Freq: Once | INTRAMUSCULAR | Status: AC
  Administered 2012-02-06: 1 mg via INTRAVENOUS
  Filled 2012-02-06: qty 1

## 2012-02-06 NOTE — Progress Notes (Signed)
[facility]             Progress Note   Subjective: Abdominal continue improving. Afebrile; tolerating CLD and wishing to increase consistency on his diet; still with some nausea. Still with diarrhea.  Objective: Vital signs in last 24 hours: Temp:  [97.2 F (36.2 C)-99.4 F (37.4 C)] 98.6 F (37 C) (09/24 1710) Pulse Rate:  [81-104] 94  (09/24 1710) Resp:  [15-18] 17  (09/24 1710) BP: (128-197)/(64-99) 197/83 mmHg (09/24 1710) SpO2:  [96 %-100 %] 99 % (09/24 1710) Weight:  [69.6 kg (153 lb 7 oz)-70 kg (154 lb 5.2 oz)] 69.6 kg (153 lb 7 oz) (09/24 1605) Weight change: 3.639 kg (8 lb 0.4 oz) Last BM Date: 03-04-12  Intake/Output from previous day: Mar 05, 2023 0701 - 09/24 0700 In: 240 [P.O.:240] Out: -  Total I/O In: 373 [P.O.:180; I.V.:193] Out: -163    Physical Exam: General: Alert, awake, oriented x3. HEENT: No bruits, no goiter. Heart: Regular rate and rhythm, without murmurs, rubs, gallops. Lungs: Clear to auscultation bilaterally. Abdomen: Soft, nontender, nondistended, positive bowel sounds. Extremities: No clubbing cyanosis or edema with positive pedal pulses. Neuro: Grossly intact, nonfocal.  Lab Results: Basic Metabolic Panel:  Basename 02/06/12 1306 03/04/12 0730  NA 128* 135  K 4.2 5.3*  CL 76* 78*  CO2 26 24  GLUCOSE 168* 146*  BUN 100* 75*  CREATININE 12.96* 10.58*  CALCIUM 7.4* 9.2  MG -- --  PHOS 8.6* 9.1*   Liver Function Tests:  Basename 02/06/12 1306 04-Mar-2012 0730  AST -- --  ALT -- --  ALKPHOS -- --  BILITOT -- --  PROT -- --  ALBUMIN 3.2* 4.1    Basename Mar 04, 2012 1752  LIPASE 11  AMYLASE --   CBC:  Basename 02/06/12 1306 03/04/2012 0730  WBC 14.9* 17.8*  NEUTROABS -- --  HGB 14.1 18.7*  HCT 40.7 54.6*  MCV 88.3 91.8  PLT 93* 107*   CBG:  Basename 02/06/12 1158 02/06/12 0728 March 04, 2012 2220 04-Mar-2012 1810 Mar 04, 2012 1202 04-Mar-2012 0616  GLUCAP 130* 184* 150* 124* 142* 133*   Urine Drug Screen: Drugs of Abuse    Component Value Date/Time   LABOPIA POSITIVE* 02/08/2010 0102   COCAINSCRNUR NONE DETECTED 02/08/2010 0102   LABBENZ NONE DETECTED 02/08/2010 0102   AMPHETMU NONE DETECTED 02/08/2010 0102   THCU NONE DETECTED 02/08/2010 0102   LABBARB  Value: NONE DETECTED        DRUG SCREEN FOR MEDICAL PURPOSES ONLY.  IF CONFIRMATION IS NEEDED FOR ANY PURPOSE, NOTIFY LAB WITHIN 5 DAYS.        LOWEST DETECTABLE LIMITS FOR URINE DRUG SCREEN Drug Class       Cutoff (ng/mL) Amphetamine      1000 Barbiturate      200 Benzodiazepine   200 Tricyclics       300 Opiates          300 Cocaine          300 THC              50 02/08/2010 0102     Studies/Results: Dg Abd 2 Views  Mar 04, 2012  *RADIOLOGY REPORT*  Clinical Data: Small bowel obstruction follow-up.  ABDOMEN - 2 VIEW  Comparison: 02/03/2012  Findings: Lung bases are clear. A few air-fluid levels again noted, particularly within the right lower quadrant.  There is some gas within colon.  Surgical clips right upper quadrant.  Organ outlines normal where seen.  No acute osseous finding.  Atherosclerotic  vascular calcification.  IMPRESSION: Nonspecific bowel gas pattern. Mild small bowel air-fluid levels however air noted within normal caliber large bowel.  Therefore, pattern remains in keeping with ileus, enteritis, or less likely, partial obstruction.   Original Report Authenticated By: Waneta Martins, M.D.     Medications: Scheduled Meds:    . enoxaparin (LOVENOX) injection  30 mg Subcutaneous Q24H  . insulin aspart  0-9 Units Subcutaneous TID WC  . metoCLOPramide (REGLAN) injection  5 mg Intravenous Once  . metroNIDAZOLE  500 mg Oral Q8H  . sodium chloride  3 mL Intravenous Q12H   Continuous Infusions:    . DISCONTD: sodium chloride 75 mL/hr at 02/06/12 0440   PRN Meds:.acetaminophen, diphenhydrAMINE, docusate sodium, heparin, hydrALAZINE, labetalol, LORazepam, morphine injection, ondansetron (ZOFRAN) IV, sorbitol  Assessment/Plan:  Principal  Problem:  *Nausea & vomiting Active Problems:  Pancreatitis, acute  SBO (small bowel obstruction)  Hyperkalemia  ESRD (end stage renal disease)  HTN (hypertension)  DM type 2 (diabetes mellitus, type 2)   Nausea and Vomiting -Suspect 2/2 partial SBO due to C. Diff enteritis -No further vomiting; still with mild nausea (but wants to advance his diet) -Patient removed NGT on 02/03/12 on his own. - US shows no CBD stones or dilatation and he is s/p cholecystectomy. -Abdominal x-ray demonstrating no of infarction, but with positive bowel pattern for pancreatitis (most likely due to C. difficile infection based on positive C. difficile by PCR.)  Hyperkalemia -Had emergent HD on 02/03/12 for a K >7.5. -K  4.2 today   HTN -Improved on meds. -Makes me question his compliance at home. -Per renal notes he always has BPs in the 200s upon arrival to his outpatient HD. -Continue current regimen -Further adjustment as needed base on BP levels  DM Type II -A1C 6.1. -Diet controlled at home. -CBGs have been in the 150s-190s. -Continue SSI while in the hospital.  C. Diff Diarrhea -positive c. Diff by PCR -started on flagyl TID -repeated abdominal x-ray demonstrating pattern with concerns for enteritis (probably due to c. Diff); no obstruction.  ESRD -continue HD T-T-S Renal on board an following him  DVT Prophylaxis -Cont Lovenox.  Time spent coordinating care: 35 minutes.   LOS: 3 days   [name] [facility] Pager: [phone] 02/06/2012, 5:18 PM

## 2012-02-06 NOTE — Procedures (Signed)
I was present at this dialysis session. I have reviewed the session itself and made appropriate changes.   [name], MD [employer] 02/06/2012, 3:06 PM

## 2012-02-06 NOTE — Progress Notes (Signed)
Off unit.

## 2012-02-06 NOTE — Progress Notes (Signed)
Subjective:  Still with occasional nausea, but tolerating liquids; wants to try soft foods.  Objective: Vital signs in last 24 hours: Temp:  [97.9 F (36.6 C)-99.4 F (37.4 C)] 97.9 F (36.6 C) (09/24 0924) Pulse Rate:  [81-104] 81  (09/24 0924) Resp:  [16-18] 16  (09/24 0924) BP: (110-151)/(64-78) 132/64 mmHg (09/24 0924) SpO2:  [96 %-100 %] 100 % (09/24 0924) Weight:  [70 kg (154 lb 5.2 oz)] 70 kg (154 lb 5.2 oz) (09/23 2228) Weight change: 3.639 kg (8 lb 0.4 oz)  Intake/Output from previous day: 09/23 0701 - 09/24 0700 In: 240 [P.O.:240] Out: -    EXAM: General appearance:  Alert, in no apparent distress Resp:  CTA without rales, rhonchi, or wheezes Cardio:  RRR without murmur GI: + BS, soft and nontender Extremities:  No edema Access:  AVF @ LUA with + bruit  Lab Results:  Basename 02/05/12 0730 02/04/12 1040  WBC 17.8* 15.9*  HGB 18.7* 19.7*  HCT 54.6* 57.3*  PLT 107* 139*   BMET:  Basename 02/05/12 0730 02/04/12 1040  NA 135 145  K 5.3* 5.3*  CL 78* 84*  CO2 24 28  GLUCOSE 146* 187*  BUN 75* 48*  CREATININE 10.58* 10.03*  CALCIUM 9.2 11.5*  ALBUMIN 4.1 --   No results found for this basename: PTH:2 in the last 72 hours Iron Studies: No results found for this basename: IRON,TIBC,TRANSFERRIN,FERRITIN in the last 72 hours  Dialysis orders: [facility] 3.75 hr EDQW 73.5 2K 2.5 Ca 500/800 No Na modeling heparin 2400 Epo 4400 Venofer 50/Th no Zemplar  Optiflux 180 Last Hgb 11.3 8/10 iPTH 174 26% sat and ferritin 285   Assessment/Plan:  1. Cdif+ diarrhea; may have been cause of N/V also. Started on flaygl 500 tid.  2. ESRD - HD on TTS @ [place], K 5.3; started HD in [place], transferred here 9/10. Next HD today. 3. Anemia - Hgb markedly high, Hgb 18.7. 4. Secondary hyperparathyroidism - Ca 9.2, Alb 4.1, P up to 9.1, no Zemplar. 5. HTN/Volume - better with vol down and IV hydralazine x 1. Outpt meds include Norvasc, Coreg, Lisinopril, and Clonidine; .  No fluid  removal today. 3 kg below dry weight, no fluid off today. Stopped IVF's today 6. Nutrition - Liquids only. 7. Hepatitis C with hx cirrhosis - increased AST. 8. Thrombocytopenia - Plts 107, up from 89.    LOS: 3 days   LYLES,CHARLES 02/06/2012,10:11 AM  Patient seen and examined and agree with assessment and plan as above with additions as indicated.  [name]  MD [facility] [phone] pgr    [phone] cell 02/06/2012, 12:57 PM

## 2012-02-07 LAB — GLUCOSE, CAPILLARY
Glucose-Capillary: 124 mg/dL — ABNORMAL HIGH (ref 70–99)
Glucose-Capillary: 109 mg/dL — ABNORMAL HIGH (ref 70–99)
Glucose-Capillary: 133 mg/dL — ABNORMAL HIGH (ref 70–99)
Glucose-Capillary: 121 mg/dL — ABNORMAL HIGH (ref 70–99)

## 2012-02-07 MED ORDER — HEPARIN SODIUM (PORCINE) 1000 UNIT/ML DIALYSIS: 20.0000 [IU]/kg | INTRAMUSCULAR | Status: DC | PRN

## 2012-02-07 MED ORDER — SEVELAMER CARBONATE 800 MG PO TABS
1600.0000 mg | ORAL_TABLET | Freq: Three times a day (TID) | ORAL | Status: DC
  Administered 2012-02-07 – 2012-02-08 (×3): 1600 mg via ORAL
  Filled 2012-02-07 (×7): qty 2

## 2012-02-07 MED ORDER — MORPHINE SULFATE 2 MG/ML IJ SOLN
1.0000 mg | INTRAMUSCULAR | Status: DC | PRN
  Administered 2012-02-07 – 2012-02-08 (×6): 1 mg via INTRAVENOUS
  Filled 2012-02-07 (×5): qty 1

## 2012-02-07 NOTE — Progress Notes (Signed)
[facility]             Progress Note   Subjective: 4 loose stools overnight, none since, tolerating current diet  Objective: Vital signs in last 24 hours: Temp:  [97.2 F (36.2 C)-99.3 F (37.4 C)] 98.8 F (37.1 C) (09/25 1427) Pulse Rate:  [84-96] 84  (09/25 1427) Resp:  [15-17] 16  (09/25 1427) BP: (153-197)/(71-99) 182/81 mmHg (09/25 1427) SpO2:  [95 %-100 %] 100 % (09/25 1427) Weight:  [69.6 kg (153 lb 7 oz)-69.7 kg (153 lb 10.6 oz)] 69.7 kg (153 lb 10.6 oz) (09/24 2041) Weight change: -0.4 kg (-14.1 oz) Last BM Date: 02/05/12  Intake/Output from previous day: 09/24 0701 - 09/25 0700 In: 613 [P.O.:420; I.V.:193] Out: -163  Total I/O In: 480 [P.O.:480] Out: -    Physical Exam: General: Alert, awake, oriented x3. HEENT: No bruits, no goiter. Heart: Regular rate and rhythm, without murmurs, rubs, gallops. Lungs: Clear to auscultation bilaterally. Abdomen: Soft, nontender, nondistended, positive bowel sounds. Extremities: No clubbing cyanosis or edema with positive pedal pulses. Neuro: Grossly intact, nonfocal.  Lab Results: Basic Metabolic Panel:  Basename 02/06/12 1306 02/05/12 0730  NA 128* 135  K 4.2 5.3*  CL 76* 78*  CO2 26 24  GLUCOSE 168* 146*  BUN 100* 75*  CREATININE 12.96* 10.58*  CALCIUM 7.4* 9.2  MG -- --  PHOS 8.6* 9.1*   Liver Function Tests:  Basename 02/06/12 1306 02/05/12 0730  AST -- --  ALT -- --  ALKPHOS -- --  BILITOT -- --  PROT -- --  ALBUMIN 3.2* 4.1    Basename 02/05/12 1752  LIPASE 11  AMYLASE --   CBC:  Basename 02/06/12 1306 02/05/12 0730  WBC 14.9* 17.8*  NEUTROABS -- --  HGB 14.1 18.7*  HCT 40.7 54.6*  MCV 88.3 91.8  PLT 93* 107*   CBG:  Basename 02/07/12 1228 02/07/12 0801 02/06/12 2039 02/06/12 1713 02/06/12 1158 02/06/12 0728  GLUCAP 109* 124* 195* 111* 130* 184*   Urine Drug Screen: Drugs of Abuse     Component Value Date/Time   LABOPIA POSITIVE* 02/08/2010 0102   COCAINSCRNUR  NONE DETECTED 02/08/2010 0102   LABBENZ NONE DETECTED 02/08/2010 0102   AMPHETMU NONE DETECTED 02/08/2010 0102   THCU NONE DETECTED 02/08/2010 0102   LABBARB  Value: NONE DETECTED        DRUG SCREEN FOR MEDICAL PURPOSES ONLY.  IF CONFIRMATION IS NEEDED FOR ANY PURPOSE, NOTIFY LAB WITHIN 5 DAYS.        LOWEST DETECTABLE LIMITS FOR URINE DRUG SCREEN Drug Class       Cutoff (ng/mL) Amphetamine      1000 Barbiturate      200 Benzodiazepine   200 Tricyclics       300 Opiates          300 Cocaine          300 THC              50 02/08/2010 0102     Studies/Results: No results found.  Medications: Scheduled Meds:    . dicyclomine  10 mg Oral TID AC & HS  . enoxaparin (LOVENOX) injection  30 mg Subcutaneous Q24H  . insulin aspart  0-9 Units Subcutaneous TID WC  . metroNIDAZOLE  500 mg Oral Q8H  .  morphine injection  1 mg Intravenous Once  . sevelamer  1,600 mg Oral TID WC  . sodium chloride  3 mL Intravenous Q12H   Continuous Infusions:  PRN Meds:.acetaminophen, diphenhydrAMINE, docusate sodium, heparin, hydrALAZINE, labetalol, LORazepam, morphine injection, ondansetron (ZOFRAN) IV, sorbitol, DISCONTD: heparin, DISCONTD:  morphine injection  Assessment/Plan:  Principal Problem:  *Nausea & vomiting Active Problems:  Pancreatitis, acute  SBO (small bowel obstruction)  Hyperkalemia  ESRD (end stage renal disease)  HTN (hypertension)  DM type 2 (diabetes mellitus, type 2)  C. difficile enteritis    C diff colitis/Nausea and Vomiting -Suspect 2/2 partial SBO due to C. Diff enteritis -No further vomiting; tolerating current diet today -diarrhea improving -Patient removed NGT on 02/03/12 on his own. - US shows no CBD stones or dilatation and he is s/p cholecystectomy.   Hyperkalemia -Had emergent HD on 02/03/12 for a K >7.5. -resolved   HTN -Improved on meds. -Makes me question his compliance at home. -Per renal notes he always has BPs in the 200s upon arrival to his outpatient  HD. -Continue current regimen   DM Type II -A1C 6.1. -Diet controlled at home. -CBGs have been in the 150s-190s. -Continue SSI while in the hospital.  ESRD -continue HD T-T-S Renal following   DVT Prophylaxis -Cont Lovenox.  Time spent coordinating care: 35 minutes.   LOS: 4 days   [facility] Pager: [phone] 02/07/2012, 2:56 PM

## 2012-02-07 NOTE — Progress Notes (Signed)
Subjective:  Had solid food for breakfast, but now with mild lower abdominal pain; diarrhea resolving.  Objective: Vital signs in last 24 hours: Temp:  [97.2 F (36.2 C)-99.3 F (37.4 C)] 98.9 F (37.2 C) (09/25 0555) Pulse Rate:  [81-98] 85  (09/25 0555) Resp:  [15-17] 16  (09/25 0555) BP: (132-197)/(64-99) 168/82 mmHg (09/25 0555) SpO2:  [96 %-100 %] 97 % (09/25 0555) Weight:  [69.6 kg (153 lb 7 oz)-69.7 kg (153 lb 10.6 oz)] 69.7 kg (153 lb 10.6 oz) (09/24 2041) Weight change: -0.4 kg (-14.1 oz)  Intake/Output from previous day: 09/24 0701 - 09/25 0700 In: 613 [P.O.:420; I.V.:193] Out: -163    EXAM: General appearance:  Alert, in no apparent distress Resp:  CTA without rales, rhonchi, or wheezes Cardio:  RRR without murmur GI:  + BS, soft and nontender Extremities:  No edema Access:  AVF @ LUA with + bruit  Lab Results:  Basename 02/06/12 1306 02/05/12 0730  WBC 14.9* 17.8*  HGB 14.1 18.7*  HCT 40.7 54.6*  PLT 93* 107*   BMET:  Basename 02/06/12 1306 02/05/12 0730  NA 128* 135  K 4.2 5.3*  CL 76* 78*  CO2 26 24  GLUCOSE 168* 146*  BUN 100* 75*  CREATININE 12.96* 10.58*  CALCIUM 7.4* 9.2  ALBUMIN 3.2* 4.1   No results found for this basename: PTH:2 in the last 72 hours Iron Studies: No results found for this basename: IRON,TIBC,TRANSFERRIN,FERRITIN in the last 72 hours  Dialysis orders: [facility] 3.75 hr EDQW 73.5 2K 2.5 Ca 500/800 No Na modeling heparin 2400 Epo 4400 Venofer 50/Th no Zemplar  Optiflux 180 Last Hgb 11.3 8/10 iPTH 174 26% sat and ferritin 285   Assessment/Plan:  1. C diff - diarrhea resolving, on Flagyl 500 mg tid. 2. ESRD - HD on TTS @ [place], K 4.2; started HD in [place], transferred here 9/10. 3. Anemia - Hgb markedly high, but down to 14.1. 4. Secondary hyperparathyroidism - Ca down from 11.5 on 9/22 to 7.4 yesterday, Alb 4.1, P 8.6, but binders recently held; no Zemplar.  Restart binders. 5. HTN/Volume - BP 168/82, outpt meds include  Norvasc, Coreg, Lisinopril, and Clonidine, all on hold; UF even yesterday with HD. 6. Nutrition - Starting solid foods. 7. Hepatitis C with hx cirrhosis - increased AST. 8. Thrombocytopenia - Plts down to 93..  LOS: 4 days   [name],[name] 02/07/2012,8:49 AM  Patient seen and examined and agree with assessment and plan as above.  Jerome Moselle  MD [facility] [phone] pgr    [phone] cell 02/07/2012, 1:50 PM

## 2012-02-08 ENCOUNTER — Inpatient Hospital Stay (HOSPITAL_COMMUNITY): Payer: Medicare Other

## 2012-02-08 LAB — RENAL FUNCTION PANEL
BUN: 80 mg/dL — ABNORMAL HIGH (ref 6–23)
CO2: 22 mEq/L (ref 19–32)
Chloride: 87 mEq/L — ABNORMAL LOW (ref 96–112)
Glucose, Bld: 106 mg/dL — ABNORMAL HIGH (ref 70–99)
Potassium: 4.3 mEq/L (ref 3.5–5.1)

## 2012-02-08 LAB — CBC
HCT: 35.8 % — ABNORMAL LOW (ref 39.0–52.0)
Hemoglobin: 12.4 g/dL — ABNORMAL LOW (ref 13.0–17.0)
RBC: 4.05 MIL/uL — ABNORMAL LOW (ref 4.22–5.81)
WBC: 11.6 10*3/uL — ABNORMAL HIGH (ref 4.0–10.5)

## 2012-02-08 MED ORDER — CALCIUM CARBONATE ANTACID 500 MG PO CHEW
400.0000 mg | CHEWABLE_TABLET | Freq: Three times a day (TID) | ORAL | Status: DC
  Filled 2012-02-08 (×2): qty 2

## 2012-02-08 MED ORDER — METRONIDAZOLE 500 MG PO TABS: 500.0000 mg | ORAL_TABLET | Freq: Three times a day (TID) | ORAL | Status: DC

## 2012-02-08 MED ORDER — OXYCODONE-ACETAMINOPHEN 5-325 MG PO TABS: 1.0000 | ORAL_TABLET | Freq: Once | ORAL | Status: DC

## 2012-02-08 MED ORDER — SEVELAMER CARBONATE 800 MG PO TABS: 1600.0000 mg | ORAL_TABLET | Freq: Three times a day (TID) | ORAL | Status: DC

## 2012-02-08 MED ORDER — DIPHENHYDRAMINE HCL 50 MG/ML IJ SOLN
INTRAMUSCULAR | Status: AC
  Administered 2012-02-08: 12.5 mg via INTRAVENOUS
  Filled 2012-02-08: qty 1

## 2012-02-08 MED ORDER — CARVEDILOL 25 MG PO TABS: 25.0000 mg | ORAL_TABLET | Freq: Two times a day (BID) | ORAL | Status: DC

## 2012-02-08 MED ORDER — AMLODIPINE BESYLATE 10 MG PO TABS: 10.0000 mg | ORAL_TABLET | Freq: Every day | ORAL | Status: DC

## 2012-02-08 MED ORDER — CARVEDILOL 25 MG PO TABS
25.0000 mg | ORAL_TABLET | Freq: Two times a day (BID) | ORAL | Status: DC
  Filled 2012-02-08 (×2): qty 1

## 2012-02-08 MED ORDER — AMLODIPINE BESYLATE 10 MG PO TABS
10.0000 mg | ORAL_TABLET | Freq: Every day | ORAL | Status: DC
  Filled 2012-02-08: qty 1

## 2012-02-08 NOTE — Procedures (Signed)
I was present at this dialysis session. I have reviewed the session itself and made appropriate changes.   [name], MD [employer] 02/08/2012, 11:03 AM

## 2012-02-08 NOTE — Progress Notes (Addendum)
Subjective:   Seen on HD, tolerating solid food, but still with mild low abdominal pain; still with loose stools, but about once a day.  Objective: Vital signs in last 24 hours: Temp:  [98.3 F (36.8 C)-98.8 F (37.1 C)] 98.3 F (36.8 C) (09/26 0620) Pulse Rate:  [63-88] 63  (09/26 0700) Resp:  [16-18] 16  (09/26 0657) BP: (153-196)/(75-99) 179/93 mmHg (09/26 0700) SpO2:  [95 %-100 %] 99 % (09/26 0620) Weight:  [71.6 kg (157 lb 13.6 oz)-72 kg (158 lb 11.7 oz)] 71.6 kg (157 lb 13.6 oz) (09/26 0620) Weight change: 2.4 kg (5 lb 4.7 oz)  Intake/Output from previous day: 09/25 0701 - 09/26 0700 In: 1200 [P.O.:1200] Out: -    EXAM: General appearance:  Alert, in no apparent distress Resp:  CTA without rales, rhonchi, or wheezes Cardio:  RRR without murmur GI:  + BS, soft and nontender Extremities:  No edema Access:  AVF @ LUA with BFR 300 cc/min  Lab Results:  Basename 02/06/12 1306  WBC 14.9*  HGB 14.1  HCT 40.7  PLT 93*   BMET:  Basename 02/06/12 1306  NA 128*  K 4.2  CL 76*  CO2 26  GLUCOSE 168*  BUN 100*  CREATININE 12.96*  CALCIUM 7.4*  ALBUMIN 3.2*   No results found for this basename: PTH:2 in the last 72 hours Iron Studies: No results found for this basename: IRON,TIBC,TRANSFERRIN,FERRITIN in the last 72 hours  Dialysis orders: [facility] 3.75 hr EDQW 73.5 2K 2.5 Ca 500/800 No Na modeling heparin 2400 Epo 4400 Venofer 50/Th no Zemplar Optiflux 180 Last Hgb 11.3 8/10 iPTH 174 26% sat and ferritin 285  Home BP meds: clonidine 0.3hs, lisinopril 40bid, coreg 25bid, norvasc 10hs  Assessment/Plan:  1. C diff - diarrhea resolving, on Flagyl 500 mg tid. 2. ESRD - HD on TTS @ [place], K 4.2; started HD in [place], transferred here 9/10. 3. Anemia - Hgb markedly high, but down to 14.1.  CBC pending. 4. Secondary hyperparathyroidism - Ca down from 11.5 on 9/22 to 7.4 yesterday, Alb 4.1, P 8.6, but binders recently held; no Zemplar. Restart binders, renal panel pending. He  takes Tums 1000mg  ac tid at home in addition to the Renvela. I have added Tums 100 ac to active meds.  5. HTN/Volume - BP 121/56, 4 BP meds at home (see above), on hold; current wt 71.6 kg with EDW 73.5.  UF goal of 1 L. BP high now, will resume Norvasc and Coreg today.  6. Nutrition - Tolerating solid foods. 7. Hepatitis C with hx cirrhosis - increased AST. 8. Thrombocytopenia - Plts down to 93. 9. Pain- getting IV prn pain meds regularly, will d/c  LOS: 5 days   LYLES,CHARLES 02/08/2012,7:37 AM  Patient seen and examined and agree with assessment and plan as above with additions as indicated.  [name]  MD [facility] [phone] pgr    [phone] cell 02/08/2012, 11:01 AM

## 2012-02-08 NOTE — Progress Notes (Signed)
Gave d/c instructions including flaygel and instructions to take the entire script as ordered.  Patient stated understanding.

## 2012-02-22 NOTE — Discharge Summary (Signed)
Physician Discharge Summary  Patient ID: [name] MRN: [id number] DOB/AGE: Jul 23, 1955 56 y.o.  Admit date: 02/03/2012 Discharge date: 02/08/2012  Primary Care Physician:  No primary provider on file.   Discharge Diagnoses:     C diff colitis  Nausea & vomiting  Pancreatitis, chronic  Ileus due to Colitis  Hyperkalemia  ESRD (end stage renal disease)  HTN (hypertension)  DM type 2 (diabetes mellitus, type 2)  anemia of chronic disease  Secondary hyperparathyroidism       Medication List     As of 02/22/2012  2:18 PM    TAKE these medications         amLODipine 10 MG tablet   Commonly known as: NORVASC   Take 10 mg by mouth at bedtime.      aspirin 325 MG EC tablet   Take 325 mg by mouth daily.      b complex-vitamin c-folic acid 0.8 MG Tabs   Take 0.8 mg by mouth at bedtime.      calcium carbonate 500 MG chewable tablet   Commonly known as: TUMS - dosed in mg elemental calcium   Chew 2 tablets by mouth 3 (three) times daily with meals.      carvedilol 25 MG tablet   Commonly known as: COREG   Take 25 mg by mouth 2 (two) times daily with a meal.      cloNIDine 0.3 MG tablet   Commonly known as: CATAPRES   Take 0.3 mg by mouth at bedtime.      lisinopril 40 MG tablet   Commonly known as: PRINIVIL,ZESTRIL   Take 40 mg by mouth 2 (two) times daily.      metroNIDAZOLE 500 MG tablet   Commonly known as: FLAGYL   Take 1 tablet (500 mg total) by mouth every 8 (eight) hours. For 6 days      sevelamer 800 MG tablet   Commonly known as: RENVELA   Take 2 tablets (1,600 mg total) by mouth 3 (three) times daily with meals.      zolpidem 10 MG tablet   Commonly known as: AMBIEN   Take 10 mg by mouth at bedtime as needed.         Disposition and Follow-up:  PCP in 1 week  Consults: Renal dr.Schertz   Significant Diagnostic Studies:  No results found.  Brief H and P: 56 y/o man with PMH significant for ESRD on HD TTS, HTN, DM has been having  nausea and vomiting for 2 days. He has not been able to keep anything down. Was unable to go to his outpatient dialysis center today and came to the ED instead for evaluation. Was found to have a lipase of 129, an abdominal xray consistent with SBO and most importantly a K>7.5 (? Hemolyzed). Renal was called and was taken for emergent HD. I am seeing [name] in his room after his HD session. Repeat K is 3.8 (?to quick of a drop). He still has significant nausea and has projectile vomited twice since arrival to his room.   Hospital Course:   C diff colitis/Nausea and Vomiting  -Suspect 2/2 partial SBO due to C. Diff enteritis  -No further vomiting; tolerating current diet today  -diarrhea improving, continue Flagyl and complete 2 week course -Patient removed NGT on 02/03/12 on his own.  - US shows no CBD stones or dilatation and he is s/p cholecystectomy.   Hyperkalemia  -Had emergent HD on 02/03/12 for a K >7.5.  -  resolved   HTN  -Improved on meds.  -Makes me question his compliance at home.  -Per renal notes he always has BPs in the 200s upon arrival to his outpatient HD.  -Continue current regimen   DM Type II  -A1C 6.1.  -Diet controlled at home.  -CBGs have been in the 150s-190s.  -Continue SSI while in the hospital.   ESRD  -continue HD T-T-S  Renal following    Time spent on Discharge:  Signed: Joycie Aerts [facility]  02/22/2012, 2:18 PM

## 2012-05-16 ENCOUNTER — Emergency Department (HOSPITAL_COMMUNITY)
Admission: EM | Admit: 2012-05-16 | Discharge: 2012-05-16 | Disposition: A | Discharge status: Home / Self Care | Payer: Medicare Other | Attending: Emergency Medicine | Admitting: Emergency Medicine

## 2012-05-16 ENCOUNTER — Emergency Department (HOSPITAL_COMMUNITY): Payer: Medicare Other

## 2012-05-16 ENCOUNTER — Encounter (HOSPITAL_COMMUNITY): Payer: Self-pay

## 2012-05-16 DIAGNOSIS — N186 End stage renal disease: Secondary | ICD-10-CM | POA: Insufficient documentation

## 2012-05-16 DIAGNOSIS — Z79899 Other long term (current) drug therapy: Secondary | ICD-10-CM | POA: Insufficient documentation

## 2012-05-16 DIAGNOSIS — H4920 Sixth [abducent] nerve palsy, unspecified eye: Secondary | ICD-10-CM | POA: Insufficient documentation

## 2012-05-16 DIAGNOSIS — R42 Dizziness and giddiness: Secondary | ICD-10-CM | POA: Insufficient documentation

## 2012-05-16 DIAGNOSIS — H4921 Sixth [abducent] nerve palsy, right eye: Secondary | ICD-10-CM

## 2012-05-16 DIAGNOSIS — H532 Diplopia: Secondary | ICD-10-CM | POA: Insufficient documentation

## 2012-05-16 DIAGNOSIS — Z992 Dependence on renal dialysis: Secondary | ICD-10-CM | POA: Insufficient documentation

## 2012-05-16 DIAGNOSIS — E119 Type 2 diabetes mellitus without complications: Secondary | ICD-10-CM | POA: Insufficient documentation

## 2012-05-16 DIAGNOSIS — I252 Old myocardial infarction: Secondary | ICD-10-CM | POA: Insufficient documentation

## 2012-05-16 DIAGNOSIS — F17200 Nicotine dependence, unspecified, uncomplicated: Secondary | ICD-10-CM | POA: Insufficient documentation

## 2012-05-16 DIAGNOSIS — K219 Gastro-esophageal reflux disease without esophagitis: Secondary | ICD-10-CM | POA: Insufficient documentation

## 2012-05-16 DIAGNOSIS — I129 Hypertensive chronic kidney disease with stage 1 through stage 4 chronic kidney disease, or unspecified chronic kidney disease: Secondary | ICD-10-CM | POA: Insufficient documentation

## 2012-05-16 LAB — CBC WITH DIFFERENTIAL/PLATELET
Eosinophils Absolute: 0.4 10*3/uL (ref 0.0–0.7)
Eosinophils Relative: 8 % — ABNORMAL HIGH (ref 0–5)
Hemoglobin: 10.5 g/dL — ABNORMAL LOW (ref 13.0–17.0)
Lymphocytes Relative: 13 % (ref 12–46)
Lymphs Abs: 0.7 10*3/uL (ref 0.7–4.0)
MCH: 31.8 pg (ref 26.0–34.0)
MCV: 97 fL (ref 78.0–100.0)
Monocytes Relative: 11 % (ref 3–12)
Platelets: 82 10*3/uL — ABNORMAL LOW (ref 150–400)
RBC: 3.3 MIL/uL — ABNORMAL LOW (ref 4.22–5.81)
WBC: 5.3 10*3/uL (ref 4.0–10.5)

## 2012-05-16 LAB — BASIC METABOLIC PANEL
BUN: 30 mg/dL — ABNORMAL HIGH (ref 6–23)
CO2: 33 mEq/L — ABNORMAL HIGH (ref 19–32)
GFR calc non Af Amer: 9 mL/min — ABNORMAL LOW (ref >90)
Glucose, Bld: 131 mg/dL — ABNORMAL HIGH (ref 70–99)
Potassium: 4.4 mEq/L (ref 3.5–5.1)
Sodium: 140 mEq/L (ref 135–145)

## 2012-05-16 LAB — GLUCOSE, CAPILLARY: Glucose-Capillary: 121 mg/dL — ABNORMAL HIGH (ref 70–99)

## 2012-05-16 MED ORDER — EYE PATCH MISC: 1.0000 | Freq: Every day | Status: DC

## 2012-05-16 MED ORDER — CALCIUM CARBONATE ANTACID 500 MG PO CHEW
2.0000 | CHEWABLE_TABLET | Freq: Once | ORAL | Status: DC
  Filled 2012-05-16: qty 2

## 2012-05-16 NOTE — ED Provider Notes (Signed)
History     CSN: [id number]  Arrival date & time 05/16/12  1706   First MD Initiated Contact with Patient 05/16/12 1751      Chief Complaint  Patient presents with  . Headache  . Diplopia    (Consider location/radiation/quality/duration/timing/severity/associated sxs/prior treatment) Patient is a 57 y.o. male presenting with headaches. The history is provided by the patient.  Headache   He has had a global headache for about the last 3 weeks. Headache is neither getting better nor worse. Shortly after onset of a headache, he noted that her abdominal diplopia. Diplopia seems to be improved by covering one eye, and he is made to make shift patch of his right eye which does seem to give her more relief. Headache seems to be improved if he keeps his eyes patched. Headache pain is as severe as 8/10. He denies photophobia or phonophobia. He denies nausea or vomiting or weakness or incoordination. He does have a problem with occasional orthostatic dizziness but, but this is unchanged from baseline. He is a dialysis patient and had his scheduled dialysis today. He says that he had been on medication for diabetes in the past, but had been taken off of his diabetes medication because his blood sugars kept going low.  Past Medical History  Diagnosis Date  . Myocardial infarction   . Hypertension   . Diabetes mellitus   . Chronic kidney disease   . GERD (gastroesophageal reflux disease)     History reviewed. No pertinent past surgical history.  History reviewed. No pertinent family history.  History  Substance Use Topics  . Smoking status: Current Every Day Smoker -- 0.2 packs/day for 43 years    Types: Cigarettes  . Smokeless tobacco: Never Used  . Alcohol Use: No      Review of Systems  Neurological: Positive for headaches.  All other systems reviewed and are negative.    Allergies  Review of patient's allergies indicates no known allergies.  Home Medications   Current  Outpatient Rx  Name  Route  Sig  Dispense  Refill  . AMLODIPINE BESYLATE 10 MG PO TABS   Oral   Take 10 mg by mouth at bedtime.         . ASPIRIN 325 MG PO TBEC   Oral   Take 325 mg by mouth daily.         Marland Kitchen NEPHRO-VITE 0.8 MG PO TABS   Oral   Take 0.8 mg by mouth at bedtime.         Marland Kitchen CALCIUM CARBONATE ANTACID 500 MG PO CHEW   Oral   Chew 2 tablets by mouth 3 (three) times daily with meals.         Marland Kitchen CARVEDILOL 25 MG PO TABS   Oral   Take 25 mg by mouth 2 (two) times daily with a meal.         . CLONIDINE HCL 0.3 MG PO TABS   Oral   Take 0.3 mg by mouth at bedtime.         Marland Kitchen DIPHENHYDRAMINE HCL 25 MG PO TABS   Oral   Take 25 mg by mouth every 6 (six) hours as needed. For relaxation and to stop itching         . LISINOPRIL 40 MG PO TABS   Oral   Take 40 mg by mouth 2 (two) times daily.         Marland Kitchen SEVELAMER CARBONATE 800 MG PO TABS  Oral   Take 2 tablets (1,600 mg total) by mouth 3 (three) times daily with meals.   60 tablet   0   . VISINE OP   Ophthalmic   Apply 2 drops to eye once as needed. For vision           BP 171/66  Pulse 56  Temp 98.2 F (36.8 C) (Oral)  Resp 16  SpO2 96%  Physical Exam  Nursing note and vitals reviewed. 57 year old male, resting comfortably and in no acute distress. Vital signs are significant for hypertension with blood pressure 171/66, bradycardia with heart rate 56. Oxygen saturation is 96%, which is normal. Head is normocephalic and atraumatic. Pupils are 2-3 mm and reactive. Fundi are poorly visualized because of constricted pupils. There is severe restriction of lateral gaze on the right, although the high does go slightly past the midline. Oropharynx is clear. Neck is nontender and supple without adenopathy or JVD. There no carotid bruits. Back is nontender and there is no CVA tenderness. Lungs are clear without rales, wheezes, or rhonchi. Chest is nontender. Heart has regular rate and rhythm without  murmur. Abdomen is soft, flat, nontender without masses or hepatosplenomegaly and peristalsis is normoactive. Extremities have no cyanosis or edema, full range of motion is present. AV fistula present in the left arm with strong thrill. Skin is warm and dry without rash. Neurologic: Mental status is normal, cranial nerves are intact except for the right sixth nerve palsy noted above, there are no motor or sensory deficits. There is no pronator drift   ED Course  Procedures (including critical care time)  Results for orders placed during the hospital encounter of 05/16/12  CBC WITH DIFFERENTIAL      Component Value Range   WBC 5.3  4.0 - 10.5 K/uL   RBC 3.30 (*) 4.22 - 5.81 MIL/uL   Hemoglobin 10.5 (*) 13.0 - 17.0 g/dL   HCT 16.1 (*) 09.6 - 04.5 %   MCV 97.0  78.0 - 100.0 fL   MCH 31.8  26.0 - 34.0 pg   MCHC 32.8  30.0 - 36.0 g/dL   RDW 40.9 (*) 81.1 - 91.4 %   Platelets 82 (*) 150 - 400 K/uL   Neutrophils Relative 67  43 - 77 %   Neutro Abs 3.5  1.7 - 7.7 K/uL   Lymphocytes Relative 13  12 - 46 %   Lymphs Abs 0.7  0.7 - 4.0 K/uL   Monocytes Relative 11  3 - 12 %   Monocytes Absolute 0.6  0.1 - 1.0 K/uL   Eosinophils Relative 8 (*) 0 - 5 %   Eosinophils Absolute 0.4  0.0 - 0.7 K/uL   Basophils Relative 1  0 - 1 %   Basophils Absolute 0.1  0.0 - 0.1 K/uL  BASIC METABOLIC PANEL      Component Value Range   Sodium 140  135 - 145 mEq/L   Potassium 4.4  3.5 - 5.1 mEq/L   Chloride 96  96 - 112 mEq/L   CO2 33 (*) 19 - 32 mEq/L   Glucose, Bld 131 (*) 70 - 99 mg/dL   BUN 30 (*) 6 - 23 mg/dL   Creatinine, Ser 7.82 (*) 0.50 - 1.35 mg/dL   Calcium 95.6 (*) 8.4 - 10.5 mg/dL   GFR calc non Af Amer 9 (*) >90 mL/min   GFR calc Af Amer 10 (*) >90 mL/min  SEDIMENTATION RATE      Component Value  Range   Sed Rate 48 (*) 0 - 16 mm/hr  GLUCOSE, CAPILLARY      Component Value Range   Glucose-Capillary 121 (*) 70 - 99 mg/dL   Ct Head Wo Contrast  05/16/2012  *RADIOLOGY REPORT*  Clinical  Data: Headaches and diplopia  CT HEAD WITHOUT CONTRAST  Technique:  Contiguous axial images were obtained from the base of the skull through the vertex without contrast.  Comparison: 02/08/2010  Findings: The brain has a normal appearance without evidence for hemorrhage, infarction, hydrocephalus, or mass lesion.  There is no extra axial fluid collection.  The skull and paranasal sinuses are normal.  IMPRESSION: Normal brain.   Original Report Authenticated By: Signa Kell, M.D.       1. 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 I suspect that his headache is related to diplopia from the sixth nerve palsy. CT will be obtained to rule out mass lesion and I anticipate treating him with eye patching. He will be referred to neurology and ophthalmology.        Dione Booze, MD 05/16/12 2100

## 2012-05-16 NOTE — ED Notes (Signed)
Patient transported to CT 

## 2012-05-16 NOTE — ED Notes (Signed)
Contacted pharmacy regarding pt's tums, they said it will be sent in a few minutes.

## 2012-05-16 NOTE — ED Notes (Signed)
Pt back from CT

## 2012-05-16 NOTE — ED Notes (Addendum)
Pt reports headache and double vision x2 weeks. Pt presents w/a patch over his (R) eye, [facility] applied the patch today. No neuro deficits noted

## 2012-08-02 ENCOUNTER — Inpatient Hospital Stay (HOSPITAL_COMMUNITY)
Admission: EM | Admit: 2012-08-02 | Discharge: 2012-08-04 | DRG: 438 | Disposition: A | Discharge status: Home / Self Care | Payer: Medicare Other | Attending: Internal Medicine | Admitting: Internal Medicine

## 2012-08-02 ENCOUNTER — Other Ambulatory Visit: Payer: Self-pay

## 2012-08-02 ENCOUNTER — Emergency Department (HOSPITAL_COMMUNITY): Payer: Medicare Other

## 2012-08-02 ENCOUNTER — Encounter (HOSPITAL_COMMUNITY): Payer: Self-pay | Admitting: Emergency Medicine

## 2012-08-02 DIAGNOSIS — R112 Nausea with vomiting, unspecified: Secondary | ICD-10-CM

## 2012-08-02 DIAGNOSIS — D649 Anemia, unspecified: Secondary | ICD-10-CM | POA: Diagnosis not present

## 2012-08-02 DIAGNOSIS — E119 Type 2 diabetes mellitus without complications: Secondary | ICD-10-CM

## 2012-08-02 DIAGNOSIS — B1920 Unspecified viral hepatitis C without hepatic coma: Secondary | ICD-10-CM | POA: Diagnosis present

## 2012-08-02 DIAGNOSIS — K56609 Unspecified intestinal obstruction, unspecified as to partial versus complete obstruction: Secondary | ICD-10-CM

## 2012-08-02 DIAGNOSIS — Z79899 Other long term (current) drug therapy: Secondary | ICD-10-CM

## 2012-08-02 DIAGNOSIS — F17200 Nicotine dependence, unspecified, uncomplicated: Secondary | ICD-10-CM | POA: Diagnosis present

## 2012-08-02 DIAGNOSIS — I10 Essential (primary) hypertension: Secondary | ICD-10-CM

## 2012-08-02 DIAGNOSIS — D631 Anemia in chronic kidney disease: Secondary | ICD-10-CM | POA: Diagnosis present

## 2012-08-02 DIAGNOSIS — E875 Hyperkalemia: Secondary | ICD-10-CM

## 2012-08-02 DIAGNOSIS — N2581 Secondary hyperparathyroidism of renal origin: Secondary | ICD-10-CM | POA: Diagnosis present

## 2012-08-02 DIAGNOSIS — A0472 Enterocolitis due to Clostridium difficile, not specified as recurrent: Secondary | ICD-10-CM

## 2012-08-02 DIAGNOSIS — K219 Gastro-esophageal reflux disease without esophagitis: Secondary | ICD-10-CM | POA: Diagnosis present

## 2012-08-02 DIAGNOSIS — K859 Acute pancreatitis without necrosis or infection, unspecified: Principal | ICD-10-CM

## 2012-08-02 DIAGNOSIS — I252 Old myocardial infarction: Secondary | ICD-10-CM

## 2012-08-02 DIAGNOSIS — I120 Hypertensive chronic kidney disease with stage 5 chronic kidney disease or end stage renal disease: Secondary | ICD-10-CM | POA: Diagnosis present

## 2012-08-02 DIAGNOSIS — N186 End stage renal disease: Secondary | ICD-10-CM

## 2012-08-02 DIAGNOSIS — K7460 Unspecified cirrhosis of liver: Secondary | ICD-10-CM | POA: Diagnosis present

## 2012-08-02 DIAGNOSIS — Z23 Encounter for immunization: Secondary | ICD-10-CM

## 2012-08-02 DIAGNOSIS — Z7982 Long term (current) use of aspirin: Secondary | ICD-10-CM

## 2012-08-02 LAB — CBC WITH DIFFERENTIAL/PLATELET
Eosinophils Relative: 15 % — ABNORMAL HIGH (ref 0–5)
HCT: 38.1 % — ABNORMAL LOW (ref 39.0–52.0)
Hemoglobin: 13.5 g/dL (ref 13.0–17.0)
Lymphocytes Relative: 18 % (ref 12–46)
Lymphs Abs: 1.9 10*3/uL (ref 0.7–4.0)
MCV: 96 fL (ref 78.0–100.0)
Monocytes Absolute: 0.8 10*3/uL (ref 0.1–1.0)
Monocytes Relative: 7 % (ref 3–12)
Neutro Abs: 6.3 10*3/uL (ref 1.7–7.7)
WBC: 10.7 10*3/uL — ABNORMAL HIGH (ref 4.0–10.5)

## 2012-08-02 LAB — COMPREHENSIVE METABOLIC PANEL
AST: 29 U/L (ref 0–37)
BUN: 42 mg/dL — ABNORMAL HIGH (ref 6–23)
CO2: 22 mEq/L (ref 19–32)
Calcium: 9.4 mg/dL (ref 8.4–10.5)
Chloride: 96 mEq/L (ref 96–112)
Creatinine, Ser: 8.51 mg/dL — ABNORMAL HIGH (ref 0.50–1.35)
GFR calc Af Amer: 7 mL/min — ABNORMAL LOW (ref >90)
GFR calc non Af Amer: 6 mL/min — ABNORMAL LOW (ref >90)
Glucose, Bld: 135 mg/dL — ABNORMAL HIGH (ref 70–99)
Total Bilirubin: 0.2 mg/dL — ABNORMAL LOW (ref 0.3–1.2)

## 2012-08-02 LAB — LIPASE, BLOOD: Lipase: 151 U/L — ABNORMAL HIGH (ref 11–59)

## 2012-08-02 MED ORDER — ASPIRIN EC 325 MG PO TBEC
325.0000 mg | DELAYED_RELEASE_TABLET | Freq: Every day | ORAL | Status: DC
  Administered 2012-08-03 – 2012-08-04 (×2): 325 mg via ORAL
  Filled 2012-08-02 (×3): qty 1

## 2012-08-02 MED ORDER — DIPHENHYDRAMINE HCL 25 MG PO TABS: 25.0000 mg | ORAL_TABLET | Freq: Four times a day (QID) | ORAL | Status: DC | PRN

## 2012-08-02 MED ORDER — HYDROMORPHONE HCL PF 1 MG/ML IJ SOLN
1.0000 mg | INTRAMUSCULAR | Status: DC | PRN
  Administered 2012-08-02 – 2012-08-03 (×4): 1 mg via INTRAVENOUS
  Filled 2012-08-02 (×4): qty 1

## 2012-08-02 MED ORDER — SEVELAMER CARBONATE 800 MG PO TABS
1600.0000 mg | ORAL_TABLET | Freq: Three times a day (TID) | ORAL | Status: DC
Start: 2012-08-02 — End: 2012-08-04
  Administered 2012-08-03 – 2012-08-04 (×4): 1600 mg via ORAL
  Filled 2012-08-02 (×8): qty 2

## 2012-08-02 MED ORDER — AMLODIPINE BESYLATE 10 MG PO TABS
10.0000 mg | ORAL_TABLET | Freq: Every day | ORAL | Status: DC
  Administered 2012-08-02 – 2012-08-03 (×2): 10 mg via ORAL
  Filled 2012-08-02 (×3): qty 1

## 2012-08-02 MED ORDER — LISINOPRIL 40 MG PO TABS
40.0000 mg | ORAL_TABLET | Freq: Two times a day (BID) | ORAL | Status: DC
Start: 2012-08-02 — End: 2012-08-04
  Administered 2012-08-02 – 2012-08-04 (×4): 40 mg via ORAL
  Filled 2012-08-02 (×5): qty 1

## 2012-08-02 MED ORDER — ACETAMINOPHEN 650 MG RE SUPP: 650.0000 mg | Freq: Four times a day (QID) | RECTAL | Status: DC | PRN

## 2012-08-02 MED ORDER — HYDROMORPHONE HCL PF 1 MG/ML IJ SOLN: 1.0000 mg | INTRAMUSCULAR | Status: DC | PRN

## 2012-08-02 MED ORDER — HEPARIN SODIUM (PORCINE) 1000 UNIT/ML DIALYSIS: 1000.0000 [IU] | INTRAMUSCULAR | Status: DC | PRN

## 2012-08-02 MED ORDER — SODIUM CHLORIDE 0.9 % IV SOLN
INTRAVENOUS | Status: DC
  Administered 2012-08-02 – 2012-08-03 (×2): via INTRAVENOUS

## 2012-08-02 MED ORDER — LIDOCAINE HCL (PF) 1 % IJ SOLN: 5.0000 mL | INTRAMUSCULAR | Status: DC | PRN

## 2012-08-02 MED ORDER — ALTEPLASE 2 MG IJ SOLR
2.0000 mg | Freq: Once | INTRAMUSCULAR | Status: AC | PRN
  Filled 2012-08-02: qty 2

## 2012-08-02 MED ORDER — ONDANSETRON 4 MG PO TBDP
4.0000 mg | ORAL_TABLET | Freq: Once | ORAL | Status: AC
  Administered 2012-08-02: 4 mg via ORAL
  Filled 2012-08-02: qty 1

## 2012-08-02 MED ORDER — HYDROMORPHONE HCL PF 2 MG/ML IJ SOLN
2.0000 mg | Freq: Once | INTRAMUSCULAR | Status: AC
  Administered 2012-08-02: 2 mg via INTRAMUSCULAR
  Filled 2012-08-02: qty 1

## 2012-08-02 MED ORDER — ONDANSETRON HCL 4 MG/2ML IJ SOLN
4.0000 mg | Freq: Once | INTRAMUSCULAR | Status: AC
  Administered 2012-08-02: 4 mg via INTRAMUSCULAR
  Filled 2012-08-02: qty 2

## 2012-08-02 MED ORDER — ALUM & MAG HYDROXIDE-SIMETH 200-200-20 MG/5ML PO SUSP
30.0000 mL | Freq: Four times a day (QID) | ORAL | Status: DC | PRN
  Filled 2012-08-02: qty 30

## 2012-08-02 MED ORDER — CLONIDINE HCL 0.3 MG PO TABS
0.3000 mg | ORAL_TABLET | Freq: Every day | ORAL | Status: DC
Start: 2012-08-02 — End: 2012-08-04
  Administered 2012-08-02 – 2012-08-03 (×2): 0.3 mg via ORAL
  Filled 2012-08-02 (×4): qty 1

## 2012-08-02 MED ORDER — INFLUENZA VIRUS VACC SPLIT PF IM SUSP
0.5000 mL | INTRAMUSCULAR | Status: AC
  Administered 2012-08-03: 0.5 mL via INTRAMUSCULAR
  Filled 2012-08-02 (×2): qty 0.5

## 2012-08-02 MED ORDER — PENTAFLUOROPROP-TETRAFLUOROETH EX AERO: 1.0000 "application " | INHALATION_SPRAY | CUTANEOUS | Status: DC | PRN

## 2012-08-02 MED ORDER — SODIUM CHLORIDE 0.9 % IV SOLN: 100.0000 mL | INTRAVENOUS | Status: DC | PRN

## 2012-08-02 MED ORDER — ENOXAPARIN SODIUM 30 MG/0.3ML ~~LOC~~ SOLN
30.0000 mg | SUBCUTANEOUS | Status: DC
  Filled 2012-08-02: qty 0.3

## 2012-08-02 MED ORDER — ONDANSETRON HCL 4 MG/2ML IJ SOLN: 4.0000 mg | Freq: Three times a day (TID) | INTRAMUSCULAR | Status: DC | PRN

## 2012-08-02 MED ORDER — RENA-VITE PO TABS
1.0000 | ORAL_TABLET | Freq: Every day | ORAL | Status: DC
  Administered 2012-08-02 – 2012-08-03 (×2): 1 via ORAL
  Filled 2012-08-02 (×3): qty 1

## 2012-08-02 MED ORDER — CARVEDILOL 25 MG PO TABS
25.0000 mg | ORAL_TABLET | Freq: Two times a day (BID) | ORAL | Status: DC
  Administered 2012-08-02 – 2012-08-04 (×3): 25 mg via ORAL
  Filled 2012-08-02 (×6): qty 1

## 2012-08-02 MED ORDER — ONDANSETRON HCL 4 MG/2ML IJ SOLN
4.0000 mg | Freq: Four times a day (QID) | INTRAMUSCULAR | Status: DC | PRN
  Administered 2012-08-02 – 2012-08-03 (×3): 4 mg via INTRAVENOUS
  Filled 2012-08-02 (×3): qty 2

## 2012-08-02 MED ORDER — ACETAMINOPHEN 325 MG PO TABS: 650.0000 mg | ORAL_TABLET | Freq: Four times a day (QID) | ORAL | Status: DC | PRN

## 2012-08-02 MED ORDER — ONDANSETRON HCL 4 MG PO TABS: 4.0000 mg | ORAL_TABLET | Freq: Four times a day (QID) | ORAL | Status: DC | PRN

## 2012-08-02 MED ORDER — PNEUMOCOCCAL VAC POLYVALENT 25 MCG/0.5ML IJ INJ
0.5000 mL | INJECTION | INTRAMUSCULAR | Status: AC
  Administered 2012-08-03: 0.5 mL via INTRAMUSCULAR
  Filled 2012-08-02: qty 0.5

## 2012-08-02 MED ORDER — NEPRO/CARBSTEADY PO LIQD: 237.0000 mL | ORAL | Status: DC | PRN

## 2012-08-02 MED ORDER — HYDROCODONE-ACETAMINOPHEN 5-325 MG PO TABS
1.0000 | ORAL_TABLET | ORAL | Status: DC | PRN
  Administered 2012-08-03: 2 via ORAL
  Filled 2012-08-02: qty 2

## 2012-08-02 MED ORDER — CALCIUM CARBONATE ANTACID 500 MG PO CHEW
2.0000 | CHEWABLE_TABLET | Freq: Three times a day (TID) | ORAL | Status: DC
  Administered 2012-08-02 – 2012-08-04 (×5): 400 mg via ORAL
  Filled 2012-08-02 (×9): qty 2

## 2012-08-02 MED ORDER — LIDOCAINE-PRILOCAINE 2.5-2.5 % EX CREA: 1.0000 "application " | TOPICAL_CREAM | CUTANEOUS | Status: DC | PRN

## 2012-08-02 MED ORDER — HEPARIN SODIUM (PORCINE) 1000 UNIT/ML DIALYSIS
20.0000 [IU]/kg | INTRAMUSCULAR | Status: DC | PRN
  Administered 2012-08-03: 1600 [IU] via INTRAVENOUS_CENTRAL

## 2012-08-02 NOTE — ED Notes (Signed)
2 RN's assessed right arm for PIV. Patient refused to let ED RN's stick for PIV. IV team called.
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
## 2012-08-02 NOTE — ED Notes (Signed)
Patient stated he does not produce urine
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
## 2012-08-02 NOTE — H&P (Signed)
History and Physical       Hospital Admission Note Date: 08/02/2012  Patient name: [name] Medical record number: [id number] Date of birth: June 22, 1955 Age: 57 y.o. Gender: male PCP: [name], MD    Chief Complaint:  Nausea, vomiting with abdominal pain since last night  HPI: Patient is a 57 year old male with end-stage renal disease on hemodialysis TTS, history of pancreatitis, C. difficile, hypertension, presented to ED with abdominal pain, nausea and vomiting since last night. Patient stated that his symptoms started last night when he started having vomiting and multiple stools. He denied any metaplasia, melena or any hematemesis. He states that the pain was 8/10, sharp and intermittent in right upper quadrant region. He denied any alcohol use or NSAID use. He had dialysis done yesterday. He denied any fevers or chills. Patient has had cholecystectomy done, has a history of previous pancreatitis and SBO. Lipase was elevated at 151. Hospitalist service was requested for admission.   Review of Systems:  Constitutional: Denies fever, chills, diaphoresis, appetite change and fatigue.  HEENT: Denies photophobia, eye pain, redness, hearing loss, ear pain, congestion, sore throat, rhinorrhea, sneezing, mouth sores, trouble swallowing, neck pain, neck stiffness and tinnitus.   Respiratory: Denies SOB, DOE, cough, chest tightness,  and wheezing.   Cardiovascular: Denies chest pain, palpitations and leg swelling.  Gastrointestinal: Please see HPI Genitourinary: Denies dysuria, urgency, frequency, hematuria, flank pain and difficulty urinating.  Musculoskeletal: Denies myalgias, back pain, joint swelling, arthralgias and gait problem.  Skin: Denies pallor, rash and wound.  Neurological: Denies dizziness, seizures, syncope, weakness, light-headedness, numbness and headaches.  Hematological: Denies adenopathy. Easy bruising,  personal or family bleeding history  Psychiatric/Behavioral: Denies suicidal ideation, mood changes, confusion, nervousness, sleep disturbance and agitation  Past Medical History: Past Medical History  Diagnosis Date  . Myocardial infarction   . Hypertension   . Diabetes mellitus   . Chronic kidney disease   . GERD (gastroesophageal reflux disease)    History reviewed. No pertinent past surgical history.  Medications: Prior to Admission medications   Medication Sig Start Date End Date Taking? Authorizing Provider  amLODipine (NORVASC) 10 MG tablet Take 10 mg by mouth at bedtime.    Historical Provider, MD  aspirin 325 MG EC tablet Take 325 mg by mouth daily.    Historical Provider, MD  b complex-vitamin c-folic acid (NEPHRO-VITE) 0.8 MG TABS Take 0.8 mg by mouth at bedtime.    Historical Provider, MD  calcium carbonate (TUMS - DOSED IN MG ELEMENTAL CALCIUM) 500 MG chewable tablet Chew 2 tablets by mouth 3 (three) times daily with meals.    Historical Provider, MD  carvedilol (COREG) 25 MG tablet Take 25 mg by mouth 2 (two) times daily with a meal.    Historical Provider, MD  cloNIDine (CATAPRES) 0.3 MG tablet Take 0.3 mg by mouth at bedtime.    Historical Provider, MD  diphenhydrAMINE (BENADRYL ALLERGY) 25 MG tablet Take 25 mg by mouth every 6 (six) hours as needed. For relaxation and to stop itching    Historical Provider, MD  Eye Patch MISC 1 patch by Does not apply route daily. 05/16/12   Dione Booze, MD  lisinopril (PRINIVIL,ZESTRIL) 40 MG tablet Take 40 mg by mouth 2 (two) times daily.    Historical Provider, MD  sevelamer (RENVELA) 800 MG tablet Take 2 tablets (1,600 mg total) by mouth 3 (three) times daily with meals. 02/08/12   Zannie Cove, MD  Tetrahydrozoline HCl (VISINE OP) Apply 2 drops to eye  once as needed. For vision    Historical Provider, MD    Allergies:  No Known Allergies  Social History:  reports that he has been smoking Cigarettes.  He has a 10.75 pack-year  smoking history. He has never used smokeless tobacco. He reports that he does not drink alcohol or use illicit drugs.  Family History: History reviewed. No pertinent family history.  Physical Exam: Blood pressure 147/74, pulse 62, temperature 97.8 F (36.6 C), temperature source Oral, resp. rate 10, SpO2 95.00%. General: Alert, awake, oriented x3, in no acute distress. HEENT: normocephalic, atraumatic, anicteric sclera, pink conjunctiva, pupils equal and reactive to light and accomodation, oropharynx clear Neck: supple, no masses or lymphadenopathy, no goiter, no bruits  Heart: Regular rate and rhythm, without murmurs, rubs or gallops. Lungs: Clear to auscultation bilaterally, no wheezing, rales or rhonchi. Abdomen: Soft, mildly tender in the right upper quadrant region,  healed surgical scars  Extremities: No clubbing, cyanosis or edema with positive pedal pulses. Neuro: Grossly intact, no focal neurological deficits, strength 5/5 upper and lower extremities bilaterally Psych: alert and oriented x 3, normal mood and affect Skin: no rashes or lesions, warm and dry   LABS on Admission:  Basic Metabolic Panel:  Recent Labs Lab 08/02/12 0911  NA 137  K 4.5  CL 96  CO2 22  GLUCOSE 135*  BUN 42*  CREATININE 8.51*  CALCIUM 9.4   Liver Function Tests:  Recent Labs Lab 08/02/12 0911  AST 29  ALT 30  ALKPHOS 61  BILITOT 0.2*  PROT 8.9*  ALBUMIN 3.8    Recent Labs Lab 08/02/12 0911  LIPASE 151*   No results found for this basename: AMMONIA,  in the last 168 hours CBC:  Recent Labs Lab 08/02/12 0911  WBC 10.7*  NEUTROABS 6.3  HGB 13.5  HCT 38.1*  MCV 96.0  PLT 77*   Cardiac Enzymes: No results found for this basename: CKTOTAL, CKMB, CKMBINDEX, TROPONINI,  in the last 168 hours BNP: No components found with this basename: POCBNP,  CBG: No results found for this basename: GLUCAP,  in the last 168 hours   Radiological Exams on Admission: Dg Abd Acute  W/chest  08/02/2012  *RADIOLOGY REPORT*  Clinical Data: Abdominal pain.  ACUTE ABDOMEN SERIES (ABDOMEN 2 VIEW & CHEST 1 VIEW)  Comparison: 02/05/2012 abdominal films.  02/03/2012 abdominal series.  Findings: Minimal peribronchial thickening unchanged. No infiltrate, congestive heart failure or pneumothorax.  Stent left subclavian/axillary region with acute bend unchanged.  Heart size within normal limits.  Post cholecystectomy.  No plain film evidence of bowel obstruction or free intraperitoneal air.  Vascular calcifications  IMPRESSION: No plain film evidence of bowel obstruction or free intraperitoneal air.  Please see above.   Original Report Authenticated By: Lacy Duverney, M.D.     Assessment/Plan Principal Problem:   Pancreatitis, acute with intractable nausea and vomiting - Admit, place on n.p.o. status, IV fluids, pain control, antiemetics  - stool cultures, C. difficile PCR to rule out C. difficile colitis - Followup lipase in a.m. - Acute abdominal series do not show any SBO  Active Problems:    ESRD (end stage renal disease): On hemodialysis TTS - Will notify renal team    HTN (hypertension): currently stable, continue home medications    DM type 2 (diabetes mellitus, type 2) - Currently n.p.o.  History of C. difficile enteritis with diarrhea   - will obtain stool cultures for C. difficile PCR, if positive patient will need to be on vancomycin  secondary to recurrent episodes - Placed on contact isolation   DVT prophylaxis:  LOVENOX  CODE STATUS:  full code   Further plan will depend as patient's clinical course evolves and further radiologic and laboratory data become available.   Time Spent on Admission: 1 hour  Reola Buckles M.D. [facility] 08/02/2012, 2:12 PM Pager: [phone]  If 7PM-7AM, please contact night-coverage [email or web address] Password [email or web address]

## 2012-08-02 NOTE — ED Notes (Signed)
Pt unable to tolerate PO fluids (ginger ale).

## 2012-08-02 NOTE — ED Notes (Signed)
Pt presents to ED via POV with c/o abdominal pain, nausea and vomiting all since last night. NAD

## 2012-08-02 NOTE — Progress Notes (Signed)
Utilization review completed.  

## 2012-08-03 DIAGNOSIS — D649 Anemia, unspecified: Secondary | ICD-10-CM

## 2012-08-03 LAB — RENAL FUNCTION PANEL
Albumin: 3.2 g/dL — ABNORMAL LOW (ref 3.5–5.2)
BUN: 53 mg/dL — ABNORMAL HIGH (ref 6–23)
Calcium: 8.2 mg/dL — ABNORMAL LOW (ref 8.4–10.5)
Calcium: 7.7 mg/dL — ABNORMAL LOW (ref 8.4–10.5)
Chloride: 98 mEq/L (ref 96–112)
Creatinine, Ser: 10.33 mg/dL — ABNORMAL HIGH (ref 0.50–1.35)
Creatinine, Ser: 3.58 mg/dL — ABNORMAL HIGH (ref 0.50–1.35)
GFR calc Af Amer: 6 mL/min — ABNORMAL LOW (ref >90)
GFR calc Af Amer: 20 mL/min — ABNORMAL LOW (ref >90)
GFR calc non Af Amer: 18 mL/min — ABNORMAL LOW (ref >90)
Glucose, Bld: 106 mg/dL — ABNORMAL HIGH (ref 70–99)
Phosphorus: 7.2 mg/dL — ABNORMAL HIGH (ref 2.3–4.6)
Sodium: 138 mEq/L (ref 135–145)
Sodium: 139 mEq/L (ref 135–145)

## 2012-08-03 LAB — CBC
MCV: 95 fL (ref 78.0–100.0)
Platelets: 63 10*3/uL — ABNORMAL LOW (ref 150–400)
RBC: 3.22 MIL/uL — ABNORMAL LOW (ref 4.22–5.81)
RDW: 13.8 % (ref 11.5–15.5)
WBC: 7.2 10*3/uL (ref 4.0–10.5)

## 2012-08-03 LAB — LIPASE, BLOOD: Lipase: 174 U/L — ABNORMAL HIGH (ref 11–59)

## 2012-08-03 MED ORDER — CALCIUM CARBONATE ANTACID 500 MG PO CHEW
1.0000 | CHEWABLE_TABLET | Freq: Once | ORAL | Status: AC
  Administered 2012-08-03: 200 mg via ORAL
  Filled 2012-08-03: qty 1

## 2012-08-03 NOTE — Procedures (Signed)
I was present at this dialysis session. I have reviewed the session itself and made appropriate changes.   Rob Jaslene Marsteller, MD [facility] 08/03/2012, 9:39 AM   

## 2012-08-03 NOTE — Progress Notes (Signed)
Subjective:  Seen on dialysis, no nausea or vomiting since last night, wants to eat.  Objective: Vital signs in last 24 hours: Temp:  [96.8 F (36 C)-97.8 F (36.6 C)] 96.8 F (36 C) (03/22 0647) Pulse Rate:  [51-70] 56 (03/22 0730) Resp:  [8-20] 18 (03/22 0730) BP: (120-169)/(61-88) 137/72 mmHg (03/22 0730) SpO2:  [92 %-100 %] 97 % (03/22 0630) Weight:  [80.2 kg (176 lb 12.9 oz)-80.9 kg (178 lb 5.6 oz)] 80.9 kg (178 lb 5.6 oz) (03/22 0630) Weight change:   Intake/Output from previous day: 03/21 0701 - 03/22 0700 In: 218.8 [I.V.:218.8] Out: -    EXAM: General appearance:  Alert, in no apparent distress Resp:  CTA without rales, rhonchi, or wheezes Cardio:  RRR without murmur or rub GI:  + BS, soft with RUQ tenderness Extremities:  No edema Access:  AVF @ LUA with BFR 500 cc/min  Lab Results:  Recent Labs  08/02/12 0911 08/03/12 0630  WBC 10.7* 7.2  HGB 13.5 11.0*  HCT 38.1* 30.6*  PLT 77* 63*   BMET:  Recent Labs  08/02/12 0911 08/03/12 0649  NA 137 138  K 4.5 4.5  CL 96 99  CO2 22 21  GLUCOSE 135* 106*  BUN 42* 53*  CREATININE 8.51* 10.33*  CALCIUM 9.4 8.2*  ALBUMIN 3.8 3.2*   No results found for this basename: PTH,  in the last 72 hours Iron Studies: No results found for this basename: IRON, TIBC, TRANSFERRIN, FERRITIN,  in the last 72 hours  Dialysis Orders: Center: [place] on TTS.  EDW 81 kg HD Bath 2K/2Ca Time 3 hrs 45 mins Heparin 2400 U. Access AVF @ LUA BFR 500 DFR 800 Hectorol 0 mcg IV/HD Epogen 2000 Units IV/HD Venofer 50 mg qwk.  Assessment/Plan: 1. Intractable nausea and vomiting - likely pancreatitis, elevated lipase at 498, yesterday 151; acute abdominal series negative for obstruction; NPO on pain meds and antiemetics, no N or V since last night.  2. ESRD - HD on TTS @ [place]; K 4.5. HD today.  3. Hypertension/volume - BP 137/72 on Amlodipine 10 mg qd, Clonidine 0.3 mg qhs, Lisinopril 40 mg bid, and Coreg 25 mg bid; no signs of fluid overload,  EDW 81 kg.  No fluid removal today. 4. Anemia - Hgb 11, on outpatient Epogen 2000 U, Venofer qwk. Continue weekly Fe.  5. Metabolic bone disease - Ca 8.2 (8.8 corrected), P 7.2, last iPTH 7.4 on 2/27; no Hectorol, Renvela 2 with meals as outpatient.  6. Nutrition - Alb 3.2, currently NPO.  7. Hepatitis C with Hx cirrhosis     LOS: 1 day   LYLES,CHARLES 08/03/2012,7:41 AM  Patient seen and examined.  Agree with assessment and plan as above. [name]  MD [phone] pgr    [phone] cell 08/03/2012, 10:37 AM

## 2012-08-03 NOTE — Progress Notes (Signed)
[facility] PROGRESS NOTE  [name] OZH:[id number] DOB: [birth year] DOA: 08/02/2012 PCP: [name], MD  Brief narrative 57 year old male with history of ESRD on HD TTS, prior episode of pancreatitis x1, C. difficile colitis, HTN, status post cholecystectomy was admitted on 08/02/12 with complaints of abdominal pain, nausea and vomiting and multiple stools from the night prior. She denies alcohol abuse. Lipase was elevated at 151. Hospitalist service was requested for admission.  Assessment/Plan: 1. Acute pancreatitis: Etiology unclear. Patient's abdominal pain has significantly improved and he has no further nausea or loose stools. He is requesting solid food. His lipase had increased to 498 prior to dialysis but post dialysis is 174-? Significance. LFTs are unremarkable. Will advance to soft diet and monitor. Will check fasting lipids in a.m. Only new medications that was recently started was losartan. May need outpatient GI consultation for possible EUS, once acute phase has resolved. 2. ESRD: Nephrology consulted and being dialyzed today. 3. Hypertension: Controlled. Continue amlodipine, clonidine, lisinopril and carvedilol. 4. Anemia: Secondary to ESRD. Stable. 5. History of DM 2: Diet controlled. 6. History of hepatitis C with cirrhosis: gleaned per nephrology notes. 7. Thrombocytopenia: Chronic.  Code Status: Full Family Communication: Discussed with patient Disposition Plan: Home when medically stable, possibly 3/23.   Consultants:  Nephrology  Procedures:  Hemodialysis  Antibiotics:  None   HPI/Subjective: Patient indicates that he feels much better. Abdominal pain has significantly decreased to 2/10, intermittent in the RUQ. No nausea or vomiting or diarrhea. Asking to eat solid food  Objective: Filed Vitals:   08/03/12 1000 08/03/12 1028 08/03/12 1048 08/03/12 1140  BP: 136/76 106/70 138/71 129/65  Pulse: 59 59 56 67  Temp:  97.4 F (36.3 C)   97.8 F (36.6 C)  TempSrc:      Resp: 18 18 18 20   Height:      Weight:  81.2 kg (179 lb 0.2 oz)    SpO2:   97% 98%    Intake/Output Summary (Last 24 hours) at 08/03/12 1404 Last data filed at 08/03/12 1200  Gross per 24 hour  Intake 698.75 ml  Output      5 ml  Net 693.75 ml   Filed Weights   08/02/12 1636 08/03/12 0630 08/03/12 1028  Weight: 80.2 kg (176 lb 12.9 oz) 80.9 kg (178 lb 5.6 oz) 81.2 kg (179 lb 0.2 oz)    Exam:   General exam: Comfortable. Seen at HD.  Respiratory system: Clear. No increased work of breathing.  Cardiovascular system: S1 & S2 heard, RRR. No JVD, murmurs, gallops, clicks or pedal edema.  Gastrointestinal system: Abdomen is nondistended, soft. Mild tenderness in right upper quadrant but without rigidity, guarding or rebound. No organomegaly or masses appreciated .Normal bowel sounds heard.  Central nervous system: Alert and oriented. No focal neurological deficits.  Extremities: Symmetric 5 x 5 power.   Data Reviewed: Basic Metabolic Panel:  Recent Labs Lab 08/02/12 0911 08/03/12 0649 08/03/12 1120  NA 137 138 139  K 4.5 4.5 3.2*  CL 96 99 98  CO2 22 21 29   GLUCOSE 135* 106* 92  BUN 42* 53* 13  CREATININE 8.51* 10.33* 3.58*  CALCIUM 9.4 8.2* 7.7*  PHOS  --  7.2* 2.1*   Liver Function Tests:  Recent Labs Lab 08/02/12 0911 08/03/12 0649 08/03/12 1120  AST 29  --   --   ALT 30  --   --   ALKPHOS 61  --   --   BILITOT 0.2*  --   --  PROT 8.9*  --   --   ALBUMIN 3.8 3.2* 3.2*    Recent Labs Lab 08/02/12 0911 08/03/12 0649 08/03/12 1120  LIPASE 151* 498* 174*   No results found for this basename: AMMONIA,  in the last 168 hours CBC:  Recent Labs Lab 08/02/12 0911 08/03/12 0630  WBC 10.7* 7.2  NEUTROABS 6.3  --   HGB 13.5 11.0*  HCT 38.1* 30.6*  MCV 96.0 95.0  PLT 77* 63*   Cardiac Enzymes: No results found for this basename: CKTOTAL, CKMB, CKMBINDEX, TROPONINI,  in the last 168 hours BNP (last 3  results) No results found for this basename: PROBNP,  in the last 8760 hours CBG:  Recent Labs Lab 08/02/12 1624  GLUCAP 160*    Recent Results (from the past 240 hour(s))  MRSA PCR SCREENING     Status: None   Collection Time    08/03/12  5:43 AM      Result Value Range Status   MRSA by PCR NEGATIVE  NEGATIVE Final   Comment:            The GeneXpert MRSA Assay (FDA     approved for NASAL specimens     only), is one component of a     comprehensive MRSA colonization     surveillance program. It is not     intended to diagnose MRSA     infection nor to guide or     monitor treatment for     MRSA infections.     Studies: Dg Abd Acute W/chest  08/02/2012  *RADIOLOGY REPORT*  Clinical Data: Abdominal pain.  ACUTE ABDOMEN SERIES (ABDOMEN 2 VIEW & CHEST 1 VIEW)  Comparison: 02/05/2012 abdominal films.  02/03/2012 abdominal series.  Findings: Minimal peribronchial thickening unchanged. No infiltrate, congestive heart failure or pneumothorax.  Stent left subclavian/axillary region with acute bend unchanged.  Heart size within normal limits.  Post cholecystectomy.  No plain film evidence of bowel obstruction or free intraperitoneal air.  Vascular calcifications  IMPRESSION: No plain film evidence of bowel obstruction or free intraperitoneal air.  Please see above.   Original Report Authenticated By: Lacy Duverney, M.D.      Additional labs:   Scheduled Meds: . amLODipine  10 mg Oral QHS  . aspirin  325 mg Oral Daily  . calcium carbonate  2 tablet Oral TID WC  . carvedilol  25 mg Oral BID WC  . cloNIDine  0.3 mg Oral QHS  . influenza  inactive virus vaccine  0.5 mL Intramuscular Tomorrow-1000  . lisinopril  40 mg Oral BID  . multivitamin  1 tablet Oral QHS  . pneumococcal 23 valent vaccine  0.5 mL Intramuscular Tomorrow-1000  . sevelamer carbonate  1,600 mg Oral TID WC   Continuous Infusions: . sodium chloride 75 mL/hr at 08/03/12 0340    Principal Problem:   Pancreatitis,  acute Active Problems:   Nausea & vomiting   ESRD (end stage renal disease)   HTN (hypertension)   DM type 2 (diabetes mellitus, type 2)   C. difficile enteritis    Time spent: 30 minutes    [facility]  [facility] Pager [phone].   If 8PM-8AM, please contact night-coverage at [email or web address], password [facility] 08/03/2012, 2:04 PM  LOS: 1 day

## 2012-08-04 LAB — LIPID PANEL
HDL: 40 mg/dL (ref >39)
LDL Cholesterol: 28 mg/dL (ref 0–99)
Total CHOL/HDL Ratio: 2.2 RATIO
Triglycerides: 89 mg/dL (ref <150)
VLDL: 18 mg/dL (ref 0–40)

## 2012-08-04 LAB — LIPASE, BLOOD: Lipase: 132 U/L — ABNORMAL HIGH (ref 11–59)

## 2012-08-04 NOTE — Progress Notes (Signed)
Subjective:   No complaints, no nausea or vomiting yesterday, tolerating liquids, will try solid food today.  Objective: Vital signs in last 24 hours: Temp:  [97.4 F (36.3 C)-98.4 F (36.9 C)] 98.3 F (36.8 C) (03/23 0526) Pulse Rate:  [56-67] 62 (03/23 0526) Resp:  [17-20] 17 (03/23 0526) BP: (105-138)/(56-76) 114/57 mmHg (03/23 0526) SpO2:  [97 %-100 %] 100 % (03/23 0526) Weight:  [81.2 kg (179 lb 0.2 oz)-81.9 kg (180 lb 8.9 oz)] 81.9 kg (180 lb 8.9 oz) (03/22 2100) Weight change: 1 kg (2 lb 3.3 oz)  Intake/Output from previous day: 03/22 0701 - 03/23 0700 In: 1230 [P.O.:780] Out: 5    EXAM: General appearance:  Alert, in no apparent distress Resp:  CTA without rales, rhonchi, or wheezes Cardio:  RRR without murmur or rub GI:  + BS, soft and nontender Extremities:  No edema Access:  AVF @ LUA with + bruit  Lab Results:  Recent Labs  08/02/12 0911 08/03/12 0630  WBC 10.7* 7.2  HGB 13.5 11.0*  HCT 38.1* 30.6*  PLT 77* 63*   BMET:  Recent Labs  08/03/12 0649 08/03/12 1120  NA 138 139  K 4.5 3.2*  CL 99 98  CO2 21 29  GLUCOSE 106* 92  BUN 53* 13  CREATININE 10.33* 3.58*  CALCIUM 8.2* 7.7*  ALBUMIN 3.2* 3.2*   No results found for this basename: PTH,  in the last 72 hours Iron Studies: No results found for this basename: IRON, TIBC, TRANSFERRIN, FERRITIN,  in the last 72 hours  Dialysis Orders: Center: [place] on TTS.  EDW 81 kg HD Bath 2K/2Ca Time 3 hrs 45 mins Heparin 2400 U. Access AVF @ LUA BFR 500 DFR 800 Hectorol 0 mcg IV/HD Epogen 2000 Units IV/HD Venofer 50 mg qwk  Assessment/Plan: 1. Intractable nausea and vomiting - likely pancreatitis, elevated lipase at 498 yesterday, down to 132 today; acute abdominal series negative for obstruction; no recent N or V, solid food today.  2. ESRD - HD on TTS @ [place]; K 3.2 pre-HD yesterday.  Next HD on 3/25.  3. Hypertension/volume - BP 114/57 on Amlodipine 10 mg qd, Clonidine 0.3 mg qhs, Lisinopril 40 mg bid, and  Coreg 25 mg bid; wt 81.9 kg s/p 0 UF yesterday, EDW 81 kg.  4. Anemia - Hgb 11, on outpatient Epogen 2000 U, Venofer qwk. Continue weekly Fe.  5. Metabolic bone disease - Ca 7.7 (9.1 corrected), P 2.1, last iPTH 7.4 on 2/27; no Hectorol, Renvela 2 with meals as outpatient.  6. Nutrition - Alb 3.2, currently NPO.  7. Hepatitis C with Hx cirrhosis     LOS: 2 days   LYLES,CHARLES 08/04/2012,9:15 AM  Patient seen and examined.  Agree with assessment and plan as above.  Possible d/c per primary today, abdominal symptoms resolved. Rec's as above.  [name]  MD [phone] pgr    [phone] cell 08/04/2012, 2:25 PM

## 2012-08-04 NOTE — Discharge Summary (Signed)
Physician Discharge Summary  [name]:[id number] DOB: 22-Jul-1955 DOA: 08/02/2012  PCP: Jerome Aver, MD  Admit date: 08/02/2012 Discharge date: 08/04/2012  Time spent: Less than 30 minutes  Recommendations for Outpatient Follow-up:  1. With Dr. [name], PCP 2. Hemodialysis Center: Keep up regular dialysis appointments on Tuesdays, Thursdays and Saturdays. 3. Consider OP GI consultation for further evaluation of pancreatitis/? EUS.  Discharge Diagnoses:  Principal Problem:   Pancreatitis, acute Active Problems:   Nausea & vomiting   ESRD (end stage renal disease)   HTN (hypertension)   DM type 2 (diabetes mellitus, type 2)   C. difficile enteritis   Anemia   Discharge Condition: Improved & Stable  Diet recommendation: Low-fat and renal diet  Filed Weights   08/03/12 0630 08/03/12 1028 08/03/12 2100  Weight: 80.9 kg (178 lb 5.6 oz) 81.2 kg (179 lb 0.2 oz) 81.9 kg (180 lb 8.9 oz)    History of present illness:  57 year old male with history of ESRD on HD TTS, prior episode of pancreatitis x1, C. difficile colitis, HTN, status post cholecystectomy was admitted on 08/02/12 with complaints of abdominal pain, nausea and vomiting and multiple stools from the night prior. He denied alcohol abuse. Lipase was elevated at 151. Hospitalist service was requested for admission.  Hospital Course:  1. Presumed Acute pancreatitis: Etiology unclear. His Lipase was 151 on admission. The next day although he was clinically better, his lipase rose to 498 predialysis and then decreased to 174 post dialysis. Diet was advanced which he tolerated well without any further abdominal pain, nausea, vomiting or diarrhea. He may need outpatient GI consultation for possible EUS, once acute phase has resolved. 2. ESRD: Nephrology consulted and dialysis was done. Triglycerides: 89. 3. Hypertension: Controlled. Continue amlodipine, ARB and carvedilol. 4. Anemia: Secondary to ESRD. OP  follow up at dialysis. 5. History of DM 2: Diet controlled. 6. History of hepatitis C with cirrhosis: gleaned per nephrology notes. 7. Thrombocytopenia: Chronic.   Procedures:  Hemodialysis   Consultations:  Nephrology  Discharge Exam:  Complaints: Denied abdominal pain. Tolerated regular consistency diet. Denied any other complaints.  Filed Vitals:   08/03/12 1721 08/03/12 2100 08/04/12 0526 08/04/12 0959  BP: 105/57 128/56 114/57 137/73  Pulse: 58 62 62 56  Temp: 98.4 F (36.9 C) 97.8 F (36.6 C) 98.3 F (36.8 C) 97.5 F (36.4 C)  TempSrc: Oral Oral Oral Oral  Resp: 20 18 17 18   Height:      Weight:  81.9 kg (180 lb 8.9 oz)    SpO2: 100% 98% 100% 100%     General exam: Comfortable. Ambulating halls.  Respiratory system: Clear. No increased work of breathing.  Cardiovascular system: S1 and S2 heard, RRR. No JVD, murmurs or pedal edema.  Gastrointestinal system: Abdomen is nondistended, soft and nontender. Normal bowel sounds heard.  Central nervous system: Alert and oriented. No focal neurological deficits.  Extremities: Symmetric 5 x 5 power.  Discharge Instructions      Discharge Orders   Future Orders Complete By Expires     Call MD for:  persistant nausea and vomiting  As directed     Call MD for:  severe uncontrolled pain  As directed     Discharge instructions  As directed     Comments:      DIET: Low-fat and renal diet.    Increase activity slowly  As directed         Medication List    STOP taking these medications  Eye Patch Misc      TAKE these medications       amLODipine 10 MG tablet  Commonly known as:  NORVASC  Take 10 mg by mouth at bedtime.     aspirin 325 MG EC tablet  Take 325 mg by mouth daily.     b complex-vitamin c-folic acid 0.8 MG Tabs  Take 0.8 mg by mouth at bedtime.     carvedilol 25 MG tablet  Commonly known as:  COREG  Take 25 mg by mouth 2 (two) times daily with a meal.     losartan 100 MG tablet   Commonly known as:  COZAAR  Take 100 mg by mouth at bedtime.     sevelamer carbonate 800 MG tablet  Commonly known as:  RENVELA  Take 2 tablets (1,600 mg total) by mouth 3 (three) times daily with meals.       Follow-up Information   Schedule an appointment as soon as possible for a visit with Jerome Aver, MD.   Contact information:   [address] [place] [address] [phone]       Follow up with Hemodialysis Center. (Keep up regular dialysis appointments on Tuesdays, Thursdays and Saturdays.)        The results of significant diagnostics from this hospitalization (including imaging, microbiology, ancillary and laboratory) are listed below for reference.    Significant Diagnostic Studies: Dg Abd Acute W/chest  08/02/2012  *RADIOLOGY REPORT*  Clinical Data: Abdominal pain.  ACUTE ABDOMEN SERIES (ABDOMEN 2 VIEW & CHEST 1 VIEW)  Comparison: 02/05/2012 abdominal films.  02/03/2012 abdominal series.  Findings: Minimal peribronchial thickening unchanged. No infiltrate, congestive heart failure or pneumothorax.  Stent left subclavian/axillary region with acute bend unchanged.  Heart size within normal limits.  Post cholecystectomy.  No plain film evidence of bowel obstruction or free intraperitoneal air.  Vascular calcifications  IMPRESSION: No plain film evidence of bowel obstruction or free intraperitoneal air.  Please see above.   Original Report Authenticated By: Lacy Duverney, M.D.     Microbiology: Recent Results (from the past 240 hour(s))  MRSA PCR SCREENING     Status: None   Collection Time    08/03/12  5:43 AM      Result Value Range Status   MRSA by PCR NEGATIVE  NEGATIVE Final   Comment:            The GeneXpert MRSA Assay (FDA     approved for NASAL specimens     only), is one component of a     comprehensive MRSA colonization     surveillance program. It is not     intended to diagnose MRSA     infection nor to guide or     monitor treatment for      MRSA infections.     Labs: Basic Metabolic Panel:  Recent Labs Lab 08/02/12 0911 08/03/12 0649 08/03/12 1120  NA 137 138 139  K 4.5 4.5 3.2*  CL 96 99 98  CO2 22 21 29   GLUCOSE 135* 106* 92  BUN 42* 53* 13  CREATININE 8.51* 10.33* 3.58*  CALCIUM 9.4 8.2* 7.7*  PHOS  --  7.2* 2.1*   Liver Function Tests:  Recent Labs Lab 08/02/12 0911 08/03/12 0649 08/03/12 1120  AST 29  --   --   ALT 30  --   --   ALKPHOS 61  --   --   BILITOT 0.2*  --   --   PROT 8.9*  --   --  ALBUMIN 3.8 3.2* 3.2*    Recent Labs Lab 08/02/12 0911 08/03/12 0649 08/03/12 1120 08/04/12 0516  LIPASE 151* 498* 174* 132*   No results found for this basename: AMMONIA,  in the last 168 hours CBC:  Recent Labs Lab 08/02/12 0911 08/03/12 0630  WBC 10.7* 7.2  NEUTROABS 6.3  --   HGB 13.5 11.0*  HCT 38.1* 30.6*  MCV 96.0 95.0  PLT 77* 63*   Cardiac Enzymes: No results found for this basename: CKTOTAL, CKMB, CKMBINDEX, TROPONINI,  in the last 168 hours BNP: BNP (last 3 results) No results found for this basename: PROBNP,  in the last 8760 hours CBG:  Recent Labs Lab 08/02/12 1624  GLUCAP 160*    Additional labs:    Signed:  Aissata Wilmore  [facility] 08/04/2012, 12:11 PM

## 2012-08-04 NOTE — Progress Notes (Signed)
Patient discharged to home. Patient AVS reviewed. Patient verbalized understanding of medications and follow-up appointments.  Patient remains stable; no signs or symptoms of distress.  Patient educated to return to the ER in cases of exacerbation of admitting symptoms, and/or SOB, dizziness, fever, chest pain, or fainting.

## 2012-11-03 ENCOUNTER — Encounter (HOSPITAL_COMMUNITY): Payer: Self-pay | Admitting: Emergency Medicine

## 2012-11-03 ENCOUNTER — Emergency Department (HOSPITAL_COMMUNITY)
Admission: EM | Admit: 2012-11-03 | Discharge: 2012-11-03 | Disposition: A | Discharge status: Home / Self Care | Payer: Medicare Other | Attending: Emergency Medicine | Admitting: Emergency Medicine

## 2012-11-03 DIAGNOSIS — K219 Gastro-esophageal reflux disease without esophagitis: Secondary | ICD-10-CM | POA: Insufficient documentation

## 2012-11-03 DIAGNOSIS — N186 End stage renal disease: Secondary | ICD-10-CM

## 2012-11-03 DIAGNOSIS — I120 Hypertensive chronic kidney disease with stage 5 chronic kidney disease or end stage renal disease: Secondary | ICD-10-CM | POA: Insufficient documentation

## 2012-11-03 DIAGNOSIS — R51 Headache: Secondary | ICD-10-CM | POA: Insufficient documentation

## 2012-11-03 DIAGNOSIS — E119 Type 2 diabetes mellitus without complications: Secondary | ICD-10-CM | POA: Insufficient documentation

## 2012-11-03 DIAGNOSIS — Z79899 Other long term (current) drug therapy: Secondary | ICD-10-CM | POA: Insufficient documentation

## 2012-11-03 DIAGNOSIS — I252 Old myocardial infarction: Secondary | ICD-10-CM | POA: Insufficient documentation

## 2012-11-03 DIAGNOSIS — Y832 Surgical operation with anastomosis, bypass or graft as the cause of abnormal reaction of the patient, or of later complication, without mention of misadventure at the time of the procedure: Secondary | ICD-10-CM | POA: Insufficient documentation

## 2012-11-03 DIAGNOSIS — Z7982 Long term (current) use of aspirin: Secondary | ICD-10-CM | POA: Insufficient documentation

## 2012-11-03 DIAGNOSIS — T827XXA Infection and inflammatory reaction due to other cardiac and vascular devices, implants and grafts, initial encounter: Secondary | ICD-10-CM

## 2012-11-03 DIAGNOSIS — Z992 Dependence on renal dialysis: Secondary | ICD-10-CM | POA: Insufficient documentation

## 2012-11-03 DIAGNOSIS — Z4800 Encounter for change or removal of nonsurgical wound dressing: Secondary | ICD-10-CM | POA: Insufficient documentation

## 2012-11-03 DIAGNOSIS — F17200 Nicotine dependence, unspecified, uncomplicated: Secondary | ICD-10-CM | POA: Insufficient documentation

## 2012-11-03 LAB — POCT I-STAT, CHEM 8
BUN: 34 mg/dL — ABNORMAL HIGH (ref 6–23)
Creatinine, Ser: 8.6 mg/dL — ABNORMAL HIGH (ref 0.50–1.35)
Glucose, Bld: 139 mg/dL — ABNORMAL HIGH (ref 70–99)
Hemoglobin: 11.2 g/dL — ABNORMAL LOW (ref 13.0–17.0)
Potassium: 3.9 mEq/L (ref 3.5–5.1)
TCO2: 29 mmol/L (ref 0–100)

## 2012-11-03 LAB — CBC WITH DIFFERENTIAL/PLATELET
Eosinophils Absolute: 0.9 10*3/uL — ABNORMAL HIGH (ref 0.0–0.7)
Eosinophils Relative: 12 % — ABNORMAL HIGH (ref 0–5)
HCT: 32.9 % — ABNORMAL LOW (ref 39.0–52.0)
Hemoglobin: 11.1 g/dL — ABNORMAL LOW (ref 13.0–17.0)
Lymphs Abs: 1.3 10*3/uL (ref 0.7–4.0)
MCH: 31.4 pg (ref 26.0–34.0)
MCV: 92.9 fL (ref 78.0–100.0)
Monocytes Relative: 10 % (ref 3–12)
RBC: 3.54 MIL/uL — ABNORMAL LOW (ref 4.22–5.81)

## 2012-11-03 MED ORDER — VANCOMYCIN HCL 10 G IV SOLR
1750.0000 mg | Freq: Once | INTRAVENOUS | Status: AC
  Administered 2012-11-03: 1750 mg via INTRAVENOUS
  Filled 2012-11-03: qty 1750

## 2012-11-03 MED ORDER — OXYCODONE-ACETAMINOPHEN 5-325 MG PO TABS: 1.0000 | ORAL_TABLET | Freq: Four times a day (QID) | ORAL | Status: DC | PRN

## 2012-11-03 MED ORDER — OXYCODONE-ACETAMINOPHEN 5-325 MG PO TABS
1.0000 | ORAL_TABLET | Freq: Once | ORAL | Status: AC
  Administered 2012-11-03: 1 via ORAL
  Filled 2012-11-03: qty 1

## 2012-11-03 NOTE — ED Notes (Signed)
Blood cultures drawn, called for diet tray

## 2012-11-03 NOTE — ED Provider Notes (Signed)
History     CSN: [id number]  Arrival date & time 11/03/12  7829   First MD Initiated Contact with Patient 11/03/12 [phone]      Chief Complaint  Patient presents with  . Wound Check  . Headache  . Vascular Access Problem    Possible infection to graft site.    (Consider location/radiation/quality/duration/timing/severity/associated sxs/prior treatment) Patient is a 57 y.o. male presenting with wound check and headaches. The history is provided by the patient.  Wound Check This is a new problem. Associated symptoms include headaches. Pertinent negatives include no chest pain, no abdominal pain and no shortness of breath.  Headache Associated symptoms: no abdominal pain, no back pain, no diarrhea, no pain, no nausea, no neck stiffness, no numbness and no vomiting    Patient states she thinks his dialysis fistula may be infected. He states his been red and painful for the last few days. He states he was dialyzed yesterday through it. No fevers. No chest pain. He states he does have a dull headache. No Abdominal pain. He states that the graft was put in 7 years ago.  Past Medical History  Diagnosis Date  . Myocardial infarction   . Hypertension   . Diabetes mellitus   . Chronic kidney disease   . GERD (gastroesophageal reflux disease)     Past Surgical History  Procedure Laterality Date  . Dialysis fistula creation Left   . Cholecystectomy      No family history on file.  History  Substance Use Topics  . Smoking status: Current Every Day Smoker -- 0.25 packs/day for 43 years    Types: Cigarettes  . Smokeless tobacco: Never Used  . Alcohol Use: No      Review of Systems  Constitutional: Negative for activity change and appetite change.  HENT: Negative for neck stiffness.   Eyes: Negative for pain.  Respiratory: Negative for chest tightness and shortness of breath.   Cardiovascular: Negative for chest pain and leg swelling.  Gastrointestinal: Negative for nausea,  vomiting, abdominal pain and diarrhea.  Genitourinary: Negative for flank pain.  Musculoskeletal: Negative for back pain.  Skin: Positive for color change and wound. Negative for rash.  Neurological: Positive for headaches. Negative for weakness and numbness.  Psychiatric/Behavioral: Negative for behavioral problems.    Allergies  Review of patient's allergies indicates no known allergies.  Home Medications   Current Outpatient Rx  Name  Route  Sig  Dispense  Refill  . amLODipine (NORVASC) 10 MG tablet   Oral   Take 10 mg by mouth at bedtime.         Marland Kitchen aspirin 325 MG EC tablet   Oral   Take 325 mg by mouth daily.         Marland Kitchen b complex-vitamin c-folic acid (NEPHRO-VITE) 0.8 MG TABS   Oral   Take 0.8 mg by mouth at bedtime.         . carvedilol (COREG) 25 MG tablet   Oral   Take 25 mg by mouth 2 (two) times daily with a meal.         . lisinopril (PRINIVIL,ZESTRIL) 40 MG tablet   Oral   Take 40 mg by mouth daily.         Marland Kitchen losartan (COZAAR) 100 MG tablet   Oral   Take 100 mg by mouth at bedtime.         . sevelamer (RENVELA) 800 MG tablet   Oral   Take 2 tablets (1,600  mg total) by mouth 3 (three) times daily with meals.   60 tablet   0   . oxyCODONE-acetaminophen (PERCOCET/ROXICET) 5-325 MG per tablet   Oral   Take 1-2 tablets by mouth every 6 (six) hours as needed for pain.   10 tablet   0     BP 182/73  Pulse 60  Temp(Src) 98.2 F (36.8 C) (Oral)  Resp 20  SpO2 98%  Physical Exam  Constitutional: He appears well-developed and well-nourished.  HENT:  Head: Normocephalic.  Eyes: Pupils are equal, round, and reactive to light.  Cardiovascular: Normal rate and regular rhythm.   Pulmonary/Chest: Effort normal and breath sounds normal.  Abdominal: Soft.  Musculoskeletal:  Left upper extremity AV fistula. 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 the second most proximal has an area of erythema and tenderness distally on it. No fluctuance. There is a  good thrill. There is no purulence drainage. It seems thinned at the site of the erythema  Skin: There is erythema.    ED Course  Procedures (including critical care time)  Labs Reviewed  CBC WITH DIFFERENTIAL - Abnormal; Notable for the following:    RBC 3.54 (*)    Hemoglobin 11.1 (*)    HCT 32.9 (*)    Platelets 84 (*)    Eosinophils Relative 12 (*)    Eosinophils Absolute 0.9 (*)    All other components within normal limits  POCT I-STAT, CHEM 8 - Abnormal; Notable for the following:    BUN 34 (*)    Creatinine, Ser 8.60 (*)    Glucose, Bld 139 (*)    Hemoglobin 11.2 (*)    HCT 33.0 (*)    All other components within normal limits  CULTURE, BLOOD (ROUTINE X 2)  CULTURE, BLOOD (ROUTINE X 2)   No results found.   1. AV fistula infection, initial encounter       MDM   patient with pain in his bowels this graft. Vascular has seen and will do surgery for the thinning on Wednesday. Dr. Imogene Burn does not think it is infection. Discussed with Dr. Arlean Hopping from nephrology who recommended vancomycin to be dosed today and dialysis. Blood cultures were sent the patient be discharged         Juliet Rude. Rubin Payor, MD 11/03/12 1414

## 2012-11-03 NOTE — ED Notes (Signed)
Pt reports possible infection to dialysis access graft of left arm. Pt Dialysis days are Tues, Thurs, and Sat. Pt has dialysis yesterday and was told by one of the staff members that it was infected. Pt also c/o headache to back of head and left side onset yesterday. Pt denies fever. Pt presents with reddened area to access graft no drainage noted.

## 2012-11-03 NOTE — Consult Note (Addendum)
[facility] [place]  Referred by:  [facility] ED  Reason for referral: left arm graft infection  History of Present Illness  [name] is a 57 y.o. (1956-02-26) male s/p left brachiocephalic arteriovenous fistula  (2007) presents with chief complaint: Left arm pain.  Pt notes pain radiating from axilla down left arm for several days.  He denies any neurologic sx, muscle weakness, fever or chills, or any drainage from his left brachiocephalic arteriovenous fistula.  He notes no h/o steal sx.  He notes decreased flow rates down to 500 ml/min.  Past Medical History  Diagnosis Date  . Myocardial infarction   . Hypertension   . Diabetes mellitus   . Chronic kidney disease   . GERD (gastroesophageal reflux disease)    Past Surgical History  Procedure Laterality Date  . Dialysis fistula creation Left   . Cholecystectomy      History   Social History  . Marital Status: Divorced    Spouse Name: N/A    Number of Children: N/A  . Years of Education: N/A   Occupational History  . Not on file.   Social History Main Topics  . Smoking status: Current Every Day Smoker -- 0.25 packs/day for 43 years    Types: Cigarettes  . Smokeless tobacco: Never Used  . Alcohol Use: No  . Drug Use: No  . Sexually Active: Not Currently   Other Topics Concern  . Not on file   Social History Narrative  . No narrative on file   FamHx Pt unable to detail any his parents' medical problems  No current facility-administered medications on file prior to encounter.   Current Outpatient Prescriptions on File Prior to Encounter  Medication Sig Dispense Refill  . amLODipine (NORVASC) 10 MG tablet Take 10 mg by mouth at bedtime.      Marland Kitchen aspirin 325 MG EC tablet Take 325 mg by mouth daily.      Marland Kitchen b complex-vitamin c-folic acid (NEPHRO-VITE) 0.8 MG TABS Take 0.8 mg by mouth at bedtime.      . carvedilol (COREG) 25 MG tablet Take 25 mg by mouth 2 (two) times daily with a meal.      .  losartan (COZAAR) 100 MG tablet Take 100 mg by mouth at bedtime.      . sevelamer (RENVELA) 800 MG tablet Take 2 tablets (1,600 mg total) by mouth 3 (three) times daily with meals.  60 tablet  0   No Known Allergies  REVIEW OF SYSTEMS:  (Positives checked otherwise negative)  CARDIOVASCULAR:  []  chest pain, []  chest pressure, []  palpitations, []  shortness of breath when laying flat, []  shortness of breath with exertion,  []  pain in feet when walking, []  pain in feet when laying flat, []  history of blood clot in veins (DVT), []  history of phlebitis, []  swelling in legs, []  varicose veins  PULMONARY:  []  productive cough, []  asthma, []  wheezing  NEUROLOGIC:  []  weakness in arms or legs, []  numbness in arms or legs, []  difficulty speaking or slurred speech, []  temporary loss of vision in one eye, []  dizziness  HEMATOLOGIC:  []  bleeding problems, []  problems with blood clotting too easily  MUSCULOSKEL:  []  joint pain, []  joint swelling, [x]  left arm radiating pain  GASTROINTEST:  []  vomiting blood, []  blood in stool     GENITOURINARY:  []  burning with urination, []  blood in urine, [x]  ESRD-HD  PSYCHIATRIC:  []  history of major depression  INTEGUMENTARY:  []   rashes, []  ulcers  CONSTITUTIONAL:  []  fever, []  chills  Physical Examination  Filed Vitals:   11/03/12 0836  BP: 182/73  Pulse: 60  Temp: 98.2 F (36.8 C)  TempSrc: Oral  Resp: 20  SpO2: 98%    There is no weight on file to calculate BMI.  General: A&O x 3, WDWN  Head: [place]/AT  Ear/Nose/Throat: Hearing grossly intact, nares w/o erythema or drainage, oropharynx w/o Erythema/Exudate, Mallampati score: 3  Eyes: PERRLA, EOMI  Neck: Supple, no nuchal rigidity, no palpable LAD  Pulmonary: Sym exp, good air movt, CTAB, no rales, rhonchi, & wheezing  Cardiac: RRR, Nl S1, S2, no Murmurs, rubs or gallops Vascular: Vessel Right Left  Radial Palpable Not Palpable  Brachial Palpable Palpable  Carotid Palpable, without  bruit Palpable, without bruit  Aorta Not palpable N/A  Femoral Palpable Palpable  Popliteal Not palpable Not palpable  PT Not Palpable Not Palpable  DP Not Palpable Not Palpable   Gastrointestinal: soft, NTND, -G/R, - HSM, - masses, - CVAT B  Musculoskeletal: M/S 5/5 throughout , Extremities without ischemic changes , aneurysmal L BC AVF throughout, PSA in proximal segment with some skin thinning, no streaking erythema, no TTP to light touch  Neurologic: CN 2-12 intact grossly, Pain and light touch intact in extremities , Motor exam as listed above  Psychiatric: Judgment intact, Mood & affect appropriate for pt's clinical situation  Dermatologic: See M/S exam for extremity exam, no rashes otherwise noted  Lymph : No Cervical, Axillary, or Inguinal lymphadenopathy   Laboratory: CBC:    Component Value Date/Time   WBC 7.6 11/03/2012 0933   RBC 3.54* 11/03/2012 0933   HGB 11.2* 11/03/2012 0942   HCT 33.0* 11/03/2012 0942   PLT 84* 11/03/2012 0933   MCV 92.9 11/03/2012 0933   MCH 31.4 11/03/2012 0933   MCHC 33.7 11/03/2012 0933   RDW 14.2 11/03/2012 0933   LYMPHSABS 1.3 11/03/2012 0933   MONOABS 0.8 11/03/2012 0933   EOSABS 0.9* 11/03/2012 0933   BASOSABS 0.1 11/03/2012 0933    BMP:    Component Value Date/Time   NA 135 11/03/2012 0942   K 3.9 11/03/2012 0942   CL 97 11/03/2012 0942   CO2 29 08/03/2012 1120   GLUCOSE 139* 11/03/2012 0942   BUN 34* 11/03/2012 0942   CREATININE 8.60* 11/03/2012 0942   CALCIUM 7.7* 08/03/2012 1120   GFRNONAA 18* 08/03/2012 1120   GFRAA 20* 08/03/2012 1120    Coagulation: Lab Results  Component Value Date   INR 0.92 02/08/2010   INR 1.0 10/20/2008   No results found for this basename: PTT   Radiology: No results found.   Medical Decision Making  [name] is a 57 y.o. 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  There is no evidence of infection in this patient.  He has no leukocytosis and no left shift in his differential.  The patient does have a pseudoaneurysm in the setting of a diffusely aneurysmal left brachiocephalic arteriovenous fistula.  There are no findings consistent with imminent rupture at this point.  I have recommended elective plication of left brachiocephalic arteriovenous fistula to treat the pseudoaneurysm.  This will be scheduled this Wednesday 25 JUN 14 to accommodate his hemodialysis schedule.  I have given this patient explicit instruction  in regards how to control bleeding from this fistula if bleeding should develop, including compression and tourniquet use.  His hemodialysis unit should avoid cannulated the involved segment until it is addressed on Wednesday.  Thank you for allowing [place] to participate in this patient's care.  Leonides Sake, MD [facility] [place] Office: [phone] Pager: [phone]  11/03/2012, 11:44 AM

## 2012-11-04 ENCOUNTER — Other Ambulatory Visit: Payer: Self-pay | Admitting: *Deleted

## 2012-11-04 ENCOUNTER — Encounter (HOSPITAL_COMMUNITY): Payer: Self-pay | Admitting: Pharmacy Technician

## 2012-11-05 ENCOUNTER — Encounter (HOSPITAL_COMMUNITY): Payer: Self-pay | Admitting: *Deleted

## 2012-11-05 MED ORDER — DEXTROSE 5 % IV SOLN
1.5000 g | INTRAVENOUS | Status: AC
  Administered 2012-11-06: 1.5 g via INTRAVENOUS
  Filled 2012-11-05: qty 1.5

## 2012-11-05 MED ORDER — SODIUM CHLORIDE 0.9 % IV SOLN
INTRAVENOUS | Status: DC
  Administered 2012-11-06: 11:00:00 via INTRAVENOUS

## 2012-11-05 NOTE — Progress Notes (Signed)
Pt denies SOB, chest pain, and being under the care of a cardiologist. Pt states that "I may have had an echo and stress test in [place]." Consent for release of records to be signed on the DOS. Pt made aware to Stop taking Aspirin and herbal medications. Do not take any NSAIDs ie: Ibuprofen, Advil, Naproxen or any medication containing Aspirin.

## 2012-11-06 ENCOUNTER — Encounter (HOSPITAL_COMMUNITY)
Admission: RE | Disposition: A | Discharge status: Home / Self Care | Payer: Self-pay | Source: Ambulatory Visit | Attending: Vascular Surgery

## 2012-11-06 ENCOUNTER — Telehealth: Payer: Self-pay | Admitting: Vascular Surgery

## 2012-11-06 ENCOUNTER — Encounter (HOSPITAL_COMMUNITY): Payer: Self-pay | Admitting: *Deleted

## 2012-11-06 ENCOUNTER — Encounter (HOSPITAL_COMMUNITY): Payer: Self-pay | Admitting: Anesthesiology

## 2012-11-06 ENCOUNTER — Ambulatory Visit (HOSPITAL_COMMUNITY): Payer: Medicare Other | Admitting: Anesthesiology

## 2012-11-06 ENCOUNTER — Ambulatory Visit (HOSPITAL_COMMUNITY)
Admission: RE | Admit: 2012-11-06 | Discharge: 2012-11-06 | Disposition: A | Discharge status: Home / Self Care | Payer: Medicare Other | Source: Ambulatory Visit | Attending: Vascular Surgery | Admitting: Vascular Surgery

## 2012-11-06 ENCOUNTER — Ambulatory Visit (HOSPITAL_COMMUNITY): Payer: Medicare Other

## 2012-11-06 DIAGNOSIS — Z79899 Other long term (current) drug therapy: Secondary | ICD-10-CM | POA: Insufficient documentation

## 2012-11-06 DIAGNOSIS — I120 Hypertensive chronic kidney disease with stage 5 chronic kidney disease or end stage renal disease: Secondary | ICD-10-CM | POA: Insufficient documentation

## 2012-11-06 DIAGNOSIS — Z7982 Long term (current) use of aspirin: Secondary | ICD-10-CM | POA: Insufficient documentation

## 2012-11-06 DIAGNOSIS — N186 End stage renal disease: Secondary | ICD-10-CM | POA: Insufficient documentation

## 2012-11-06 DIAGNOSIS — K219 Gastro-esophageal reflux disease without esophagitis: Secondary | ICD-10-CM | POA: Insufficient documentation

## 2012-11-06 DIAGNOSIS — Y832 Surgical operation with anastomosis, bypass or graft as the cause of abnormal reaction of the patient, or of later complication, without mention of misadventure at the time of the procedure: Secondary | ICD-10-CM | POA: Insufficient documentation

## 2012-11-06 DIAGNOSIS — F17200 Nicotine dependence, unspecified, uncomplicated: Secondary | ICD-10-CM | POA: Insufficient documentation

## 2012-11-06 DIAGNOSIS — E119 Type 2 diabetes mellitus without complications: Secondary | ICD-10-CM | POA: Insufficient documentation

## 2012-11-06 DIAGNOSIS — T82898A Other specified complication of vascular prosthetic devices, implants and grafts, initial encounter: Secondary | ICD-10-CM

## 2012-11-06 DIAGNOSIS — I252 Old myocardial infarction: Secondary | ICD-10-CM | POA: Insufficient documentation

## 2012-11-06 HISTORY — DX: Unspecified cataract: H26.9

## 2012-11-06 HISTORY — DX: Polyneuropathy, unspecified: G62.9

## 2012-11-06 HISTORY — PX: LIGATION OF ARTERIOVENOUS  FISTULA: SHX5948

## 2012-11-06 HISTORY — DX: Inflammatory liver disease, unspecified: K75.9

## 2012-11-06 HISTORY — DX: Acute pancreatitis without necrosis or infection, unspecified: K85.90

## 2012-11-06 HISTORY — DX: Heart failure, unspecified: I50.9

## 2012-11-06 LAB — POCT I-STAT 4, (NA,K, GLUC, HGB,HCT)
HCT: 34 % — ABNORMAL LOW (ref 39.0–52.0)
Hemoglobin: 11.6 g/dL — ABNORMAL LOW (ref 13.0–17.0)
Potassium: 4.6 mEq/L (ref 3.5–5.1)

## 2012-11-06 SURGERY — LIGATION OF ARTERIOVENOUS  FISTULA
Anesthesia: General | Site: Arm Upper | Laterality: Left

## 2012-11-06 MED ORDER — OXYCODONE HCL 5 MG PO TABS
ORAL_TABLET | ORAL | Status: AC
  Filled 2012-11-06: qty 1

## 2012-11-06 MED ORDER — FENTANYL CITRATE 0.05 MG/ML IJ SOLN
INTRAMUSCULAR | Status: AC
  Administered 2012-11-06: 50 ug via INTRAVENOUS
  Filled 2012-11-06: qty 2

## 2012-11-06 MED ORDER — HEMOSTATIC AGENTS (NO CHARGE) OPTIME
TOPICAL | Status: DC | PRN
  Administered 2012-11-06: 1 via TOPICAL

## 2012-11-06 MED ORDER — OXYCODONE HCL 5 MG PO TABS: 5.0000 mg | ORAL_TABLET | ORAL | Status: DC | PRN

## 2012-11-06 MED ORDER — ATROPINE SULFATE 0.4 MG/ML IJ SOLN
INTRAMUSCULAR | Status: DC | PRN
  Administered 2012-11-06: 0.4 mg via INTRAVENOUS

## 2012-11-06 MED ORDER — THROMBIN 20000 UNITS EX SOLR
CUTANEOUS | Status: AC
  Filled 2012-11-06: qty 20000

## 2012-11-06 MED ORDER — OXYCODONE HCL 5 MG/5ML PO SOLN: 5.0000 mg | Freq: Once | ORAL | Status: AC | PRN

## 2012-11-06 MED ORDER — CARVEDILOL 25 MG PO TABS: 25.0000 mg | ORAL_TABLET | Freq: Once | ORAL | Status: AC

## 2012-11-06 MED ORDER — OXYCODONE HCL 5 MG PO TABS
ORAL_TABLET | ORAL | Status: AC
  Administered 2012-11-06: 5 mg
  Filled 2012-11-06: qty 1

## 2012-11-06 MED ORDER — MIDAZOLAM HCL 5 MG/5ML IJ SOLN
INTRAMUSCULAR | Status: DC | PRN
  Administered 2012-11-06: 1 mg via INTRAVENOUS

## 2012-11-06 MED ORDER — FENTANYL CITRATE 0.05 MG/ML IJ SOLN
INTRAMUSCULAR | Status: DC | PRN
  Administered 2012-11-06 (×3): 50 ug via INTRAVENOUS

## 2012-11-06 MED ORDER — GLYCOPYRROLATE 0.2 MG/ML IJ SOLN
INTRAMUSCULAR | Status: DC | PRN
  Administered 2012-11-06: 0.2 mg via INTRAVENOUS

## 2012-11-06 MED ORDER — EPHEDRINE SULFATE 50 MG/ML IJ SOLN
INTRAMUSCULAR | Status: DC | PRN
  Administered 2012-11-06: 10 mg via INTRAVENOUS

## 2012-11-06 MED ORDER — THROMBIN 20000 UNITS EX KIT
PACK | CUTANEOUS | Status: DC | PRN
  Administered 2012-11-06: 20000 [IU] via TOPICAL

## 2012-11-06 MED ORDER — HEPARIN SODIUM (PORCINE) 1000 UNIT/ML IJ SOLN
INTRAMUSCULAR | Status: DC | PRN
  Administered 2012-11-06: 6500 [IU] via INTRAVENOUS

## 2012-11-06 MED ORDER — PROTAMINE SULFATE 10 MG/ML IV SOLN
INTRAVENOUS | Status: DC | PRN
  Administered 2012-11-06: 10 mg via INTRAVENOUS

## 2012-11-06 MED ORDER — CARVEDILOL 12.5 MG PO TABS
ORAL_TABLET | ORAL | Status: AC
  Administered 2012-11-06: 25 mg via ORAL
  Filled 2012-11-06: qty 2

## 2012-11-06 MED ORDER — MUPIROCIN 2 % EX OINT
TOPICAL_OINTMENT | Freq: Two times a day (BID) | CUTANEOUS | Status: DC
  Administered 2012-11-06: 10:00:00 via NASAL
  Filled 2012-11-06 (×2): qty 22

## 2012-11-06 MED ORDER — 0.9 % SODIUM CHLORIDE (POUR BTL) OPTIME
TOPICAL | Status: DC | PRN
  Administered 2012-11-06: 1000 mL

## 2012-11-06 MED ORDER — PROPOFOL 10 MG/ML IV BOLUS
INTRAVENOUS | Status: DC | PRN
  Administered 2012-11-06: 150 mg via INTRAVENOUS

## 2012-11-06 MED ORDER — LIDOCAINE HCL (CARDIAC) 20 MG/ML IV SOLN
INTRAVENOUS | Status: DC | PRN
  Administered 2012-11-06: 100 mg via INTRAVENOUS

## 2012-11-06 MED ORDER — FENTANYL CITRATE 0.05 MG/ML IJ SOLN
25.0000 ug | INTRAMUSCULAR | Status: DC | PRN
  Administered 2012-11-06: 50 ug via INTRAVENOUS

## 2012-11-06 MED ORDER — SODIUM CHLORIDE 0.9 % IR SOLN
Status: DC | PRN
  Administered 2012-11-06: 12:00:00

## 2012-11-06 MED ORDER — OXYCODONE HCL 5 MG PO TABS
5.0000 mg | ORAL_TABLET | Freq: Once | ORAL | Status: AC | PRN
  Administered 2012-11-06: 5 mg via ORAL

## 2012-11-06 SURGICAL SUPPLY — 35 items
ADH SKN CLS APL DERMABOND .7 (GAUZE/BANDAGES/DRESSINGS) ×1
CANISTER SUCTION 2500CC (MISCELLANEOUS) ×2 IMPLANT
CLOTH BEACON ORANGE TIMEOUT ST (SAFETY) ×2 IMPLANT
COVER SURGICAL LIGHT HANDLE (MISCELLANEOUS) ×2 IMPLANT
DERMABOND ADVANCED (GAUZE/BANDAGES/DRESSINGS) ×1
DERMABOND ADVANCED .7 DNX12 (GAUZE/BANDAGES/DRESSINGS) ×1 IMPLANT
ELECT REM PT RETURN 9FT ADLT (ELECTROSURGICAL) ×2
ELECTRODE REM PT RTRN 9FT ADLT (ELECTROSURGICAL) ×1 IMPLANT
GEL ULTRASOUND 20GR AQUASONIC (MISCELLANEOUS) IMPLANT
GLOVE BIO SURGEON STRL SZ7 (GLOVE) ×2 IMPLANT
GLOVE BIOGEL PI IND STRL 7.5 (GLOVE) ×1 IMPLANT
GLOVE BIOGEL PI INDICATOR 7.5 (GLOVE) ×1
GOWN STRL NON-REIN LRG LVL3 (GOWN DISPOSABLE) ×4 IMPLANT
KIT BASIN OR (CUSTOM PROCEDURE TRAY) ×2 IMPLANT
KIT ROOM TURNOVER OR (KITS) ×2 IMPLANT
NS IRRIG 1000ML POUR BTL (IV SOLUTION) ×2 IMPLANT
PACK CV ACCESS (CUSTOM PROCEDURE TRAY) ×2 IMPLANT
PAD ARMBOARD 7.5X6 YLW CONV (MISCELLANEOUS) ×4 IMPLANT
SPONGE GAUZE 4X4 12PLY (GAUZE/BANDAGES/DRESSINGS) ×2 IMPLANT
SPONGE SURGIFOAM ABS GEL 100 (HEMOSTASIS) IMPLANT
STAPLER VISISTAT 35W (STAPLE) ×1 IMPLANT
SUT ETHILON 3 0 PS 1 (SUTURE) IMPLANT
SUT MNCRL AB 4-0 PS2 18 (SUTURE) ×4 IMPLANT
SUT PROLENE 5 0 C 1 24 (SUTURE) ×3 IMPLANT
SUT PROLENE 6 0 BV (SUTURE) IMPLANT
SUT SILK 0 TIES 10X30 (SUTURE) ×2 IMPLANT
SUT VIC AB 3-0 SH 27 (SUTURE) ×4
SUT VIC AB 3-0 SH 27X BRD (SUTURE) ×1 IMPLANT
SWAB COLLECTION DEVICE MRSA (MISCELLANEOUS) IMPLANT
TAPE CLOTH SOFT 2X10 (GAUZE/BANDAGES/DRESSINGS) ×1 IMPLANT
TOWEL OR 17X24 6PK STRL BLUE (TOWEL DISPOSABLE) ×2 IMPLANT
TOWEL OR 17X26 10 PK STRL BLUE (TOWEL DISPOSABLE) ×2 IMPLANT
TUBE ANAEROBIC SPECIMEN COL (MISCELLANEOUS) IMPLANT
UNDERPAD 30X30 INCONTINENT (UNDERPADS AND DIAPERS) ×2 IMPLANT
WATER STERILE IRR 1000ML POUR (IV SOLUTION) ×2 IMPLANT

## 2012-11-06 NOTE — Op Note (Signed)
OPERATIVE NOTE   PROCEDURE: 1. 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 Palpable thrill at end of the case  SPECIMEN(S):  none  INDICATIONS:   [name] is a 57 y.o. male who presents with aneurysmal left brachiocephalic arteriovenous fistula with protruding pseudoaneurysm.  Based on exam, I felt the patient was at risk for a bleeding complication in the near future.  I recommended urgent left brachiocephalic arteriovenous fistula pseudoaneurysm plication.  Risk, benefits, and alternatives to access surgery were discussed.  The patient is aware the risks include but are not limited to: bleeding, infection, steal syndrome, nerve damage, ischemic monomelic neuropathy, failure to mature, need for additional procedures, death and stroke.  The patient agrees to proceed forward with the procedure.  DESCRIPTION: After obtaining full informed written consent, the patient was brought back to the operating room and placed supine upon the operating table.  The patient received IV antibiotics prior to induction.  After obtaining adequate anesthesia, the patient was prepped and draped in the standard fashion for: left arm access.  I gave the patient 6500 units of Heparin intravenously.  After five minutes, I exsanguinated the left arm and inflated the upper arm tourniquet to 250 mm Hg.  I then made an elliptical incision around the large pseudoaneurysm.  I dissected through the subcutaneous tissue with electrocautery and dissected out the pseudoaneurysm until I found a large neck.  I transected the pseudoaneurysm with electrocautery.  There was an organized ball of thrombus  within the pseudoaneurysm.  I then I sharply tailored the aneurysmal brachiocephalic arteriovenous fistula segment.  I repaired this segment with a running stitch of 5-0 Prolene.  Prior to completing this, I de-airred the fistula by dropping the tourniquet and backbleeding the fistula while compressing the fistula proximally.  I repaired a few bleeding areas with 5-0 and 6-0 prolene stitches.  I also gave 30 mg of Protamine and placed thrombin and gelfoam in the wound.  I dissected out the fistula segment to get better skin flaps to reapproximate.  I placed a running layer of 3-0 Vicryl in the deep subcutaneous tissue immediately over the fistula.  I then placed a subdermal layer of 3-0 Vicryl.  The skin was reapproximated with staples and a sterile dressing was applied.  There remained a strong thrill in this brachiocephalic arteriovenous fistula.  COMPLICATIONS: none  CONDITION: stable   Leonides Sake, MD [facility] [place] Office: [phone] Pager: [phone]  11/06/2012, 12:26 PM

## 2012-11-06 NOTE — Telephone Encounter (Signed)
Message copied by Margaretmary Eddy on Wed Nov 06, 2012  3:57 PM ------      Message from: Melene Plan      Created: Wed Nov 06, 2012  1:42 PM                   ----- Message -----         From: Fransisco Hertz, MD         Sent: 11/06/2012  12:35 PM           To: Reuel Derby, Melene Plan, RN            [name]      [id number]      02/19/1956                  PROCEDURE:      Left brachiocephalic arteriovenous fistula pseudoaneurysm plication            Follow-up: 2 weeks             ------

## 2012-11-06 NOTE — Anesthesia Postprocedure Evaluation (Signed)
  Anesthesia Post-op Note  Patient: [name]  Procedure(s) Performed: Procedure(s): PLICATION OF BRACHIO-CEPHALIC ARTERIOVENOUS  FISTULA (Left)  Patient Location: PACU  Anesthesia Type:General  Level of Consciousness: awake  Airway and Oxygen Therapy: Patient Spontanous Breathing  Post-op Pain: mild  Post-op Assessment: Post-op Vital signs reviewed, Patient's Cardiovascular Status Stable, Respiratory Function Stable, Patent Airway, No signs of Nausea or vomiting and Pain level controlled  Post-op Vital Signs: stable  Complications: No apparent anesthesia complications

## 2012-11-06 NOTE — Interval H&P Note (Signed)
[facility] [place]  History and Physical Update  The patient was interviewed and re-examined.  The patient's previous History and Physical has been reviewed and is unchanged from my consult on: 11/03/12.  There is no change in the plan of care: plication of L BC AVF.  Leonides Sake, MD [facility] [place] Office: [phone] Pager: [phone]  11/06/2012, 8:06 AM

## 2012-11-06 NOTE — Anesthesia Procedure Notes (Signed)
Procedure Name: LMA Insertion Date/Time: 11/06/2012 11:23 AM Performed by: Carmela Rima Pre-anesthesia Checklist: Patient identified, Timeout performed, Emergency Drugs available, Suction available and Patient being monitored Patient Re-evaluated:Patient Re-evaluated prior to inductionOxygen Delivery Method: Circle system utilized Preoxygenation: Pre-oxygenation with 100% oxygen Intubation Type: IV induction Ventilation: Mask ventilation without difficulty LMA: LMA inserted LMA Size: 4.0 Number of attempts: 1 Placement Confirmation: positive ETCO2 Tube secured with: Tape Dental Injury: Teeth and Oropharynx as per pre-operative assessment

## 2012-11-06 NOTE — Transfer of Care (Signed)
Immediate Anesthesia Transfer of Care Note  Patient: [name]  Procedure(s) Performed: Procedure(s): PLICATION OF BRACHIO-CEPHALIC ARTERIOVENOUS  FISTULA (Left)  Patient Location: PACU  Anesthesia Type:General  Level of Consciousness: awake, alert  and oriented  Airway & Oxygen Therapy: Patient Spontanous Breathing and Patient connected to nasal cannula oxygen  Post-op Assessment: Report given to PACU RN and Post -op Vital signs reviewed and stable  Post vital signs: Reviewed and stable  Complications: No apparent anesthesia complications

## 2012-11-06 NOTE — Anesthesia Preprocedure Evaluation (Addendum)
Anesthesia Evaluation  Patient identified by MRN, date of birth, ID band Patient awake    Reviewed: Allergy & Precautions, H&P , NPO status , Patient's Chart, lab work & pertinent test results  Airway Mallampati: I TM Distance: >3 FB Neck ROM: Full    Dental  (+) Teeth Intact and Dental Advidsory Given   Pulmonary COPDCurrent Smoker,  breath sounds clear to auscultation        Cardiovascular hypertension, + CAD, + Past MI and +CHF Rhythm:Regular Rate:Normal     Neuro/Psych    GI/Hepatic GERD-  ,(+) Hepatitis -, C  Endo/Other  diabetes  Renal/GU Renal disease     Musculoskeletal   Abdominal   Peds  Hematology   Anesthesia Other Findings   Reproductive/Obstetrics                          Anesthesia Physical Anesthesia Plan  ASA: III  Anesthesia Plan: General   Post-op Pain Management:    Induction: Intravenous  Airway Management Planned: LMA  Additional Equipment:   Intra-op Plan:   Post-operative Plan:   Informed Consent: I have reviewed the patients History and Physical, chart, labs and discussed the procedure including the risks, benefits and alternatives for the proposed anesthesia with the patient or authorized representative who has indicated his/her understanding and acceptance.   Dental Advisory Given  Plan Discussed with: CRNA, Surgeon and Anesthesiologist  Anesthesia Plan Comments:       Anesthesia Quick Evaluation

## 2012-11-06 NOTE — Progress Notes (Signed)
Upper dentures returned to patient.  Pt placed them in his self.

## 2012-11-06 NOTE — Preoperative (Signed)
Beta Blockers   Reason not to administer Beta Blockers:Not Applicable 

## 2012-11-06 NOTE — Telephone Encounter (Signed)
Gave pt appt info - kf °

## 2012-11-06 NOTE — H&P (View-Only) (Signed)
[facility] [place]  Referred by:  [facility]  Reason for referral: left arm graft infection  History of Present Illness  [name] is a 57 y.o. (Jan 10, 1956) male s/p left brachiocephalic arteriovenous fistula  (2007) presents with chief complaint: Left arm pain.  Pt notes pain radiating from axilla down left arm for several days.  He denies any neurologic sx, muscle weakness, fever or chills, or any drainage from his left brachiocephalic arteriovenous fistula.  He notes no h/o steal sx.  He notes decreased flow rates down to 500 ml/min.  Past Medical History  Diagnosis Date  . Myocardial infarction   . Hypertension   . Diabetes mellitus   . Chronic kidney disease   . GERD (gastroesophageal reflux disease)    Past Surgical History  Procedure Laterality Date  . Dialysis fistula creation Left   . Cholecystectomy      History   Social History  . Marital Status: Divorced    Spouse Name: N/A    Number of Children: N/A  . Years of Education: N/A   Occupational History  . Not on file.   Social History Main Topics  . Smoking status: Current Every Day Smoker -- 0.25 packs/day for 43 years    Types: Cigarettes  . Smokeless tobacco: Never Used  . Alcohol Use: No  . Drug Use: No  . Sexually Active: Not Currently   Other Topics Concern  . Not on file   Social History Narrative  . No narrative on file   FamHx Pt unable to detail any his parents' medical problems  No current facility-administered medications on file prior to encounter.   Current Outpatient Prescriptions on File Prior to Encounter  Medication Sig Dispense Refill  . amLODipine (NORVASC) 10 MG tablet Take 10 mg by mouth at bedtime.      Marland Kitchen aspirin 325 MG EC tablet Take 325 mg by mouth daily.      Marland Kitchen b complex-vitamin c-folic acid (NEPHRO-VITE) 0.8 MG TABS Take 0.8 mg by mouth at bedtime.      . carvedilol (COREG) 25 MG tablet Take 25 mg by mouth 2 (two) times daily with a meal.      .  losartan (COZAAR) 100 MG tablet Take 100 mg by mouth at bedtime.      . sevelamer (RENVELA) 800 MG tablet Take 2 tablets (1,600 mg total) by mouth 3 (three) times daily with meals.  60 tablet  0   No Known Allergies  REVIEW OF SYSTEMS:  (Positives checked otherwise negative)  CARDIOVASCULAR:  []  chest pain, []  chest pressure, []  palpitations, []  shortness of breath when laying flat, []  shortness of breath with exertion,  []  pain in feet when walking, []  pain in feet when laying flat, []  history of blood clot in veins (DVT), []  history of phlebitis, []  swelling in legs, []  varicose veins  PULMONARY:  []  productive cough, []  asthma, []  wheezing  NEUROLOGIC:  []  weakness in arms or legs, []  numbness in arms or legs, []  difficulty speaking or slurred speech, []  temporary loss of vision in one eye, []  dizziness  HEMATOLOGIC:  []  bleeding problems, []  problems with blood clotting too easily  MUSCULOSKEL:  []  joint pain, []  joint swelling, [x]  left arm radiating pain  GASTROINTEST:  []  vomiting blood, []  blood in stool     GENITOURINARY:  []  burning with urination, []  blood in urine, [x]  ESRD-HD  PSYCHIATRIC:  []  history of major depression  INTEGUMENTARY:  []   rashes, []  ulcers  CONSTITUTIONAL:  []  fever, []  chills  Physical Examination  Filed Vitals:   11/03/12 0836  BP: 182/73  Pulse: 60  Temp: 98.2 F (36.8 C)  TempSrc: Oral  Resp: 20  SpO2: 98%    There is no weight on file to calculate BMI.  General: A&O x 3, WDWN  Head: [place]/AT  Ear/Nose/Throat: Hearing grossly intact, nares w/o erythema or drainage, oropharynx w/o Erythema/Exudate, Mallampati score: 3  Eyes: PERRLA, EOMI  Neck: Supple, no nuchal rigidity, no palpable LAD  Pulmonary: Sym exp, good air movt, CTAB, no rales, rhonchi, & wheezing  Cardiac: RRR, Nl S1, S2, no Murmurs, rubs or gallops Vascular: Vessel Right Left  Radial Palpable Not Palpable  Brachial Palpable Palpable  Carotid Palpable, without  bruit Palpable, without bruit  Aorta Not palpable N/A  Femoral Palpable Palpable  Popliteal Not palpable Not palpable  PT Not Palpable Not Palpable  DP Not Palpable Not Palpable   Gastrointestinal: soft, NTND, -G/R, - HSM, - masses, - CVAT B  Musculoskeletal: M/S 5/5 throughout , Extremities without ischemic changes , aneurysmal L BC AVF throughout, PSA in proximal segment with some skin thinning, no streaking erythema, no TTP to light touch  Neurologic: CN 2-12 intact grossly, Pain and light touch intact in extremities , Motor exam as listed above  Psychiatric: Judgment intact, Mood & affect appropriate for pt's clinical situation  Dermatologic: See M/S exam for extremity exam, no rashes otherwise noted  Lymph : No Cervical, Axillary, or Inguinal lymphadenopathy   Laboratory: CBC:    Component Value Date/Time   WBC 7.6 11/03/2012 0933   RBC 3.54* 11/03/2012 0933   HGB 11.2* 11/03/2012 0942   HCT 33.0* 11/03/2012 0942   PLT 84* 11/03/2012 0933   MCV 92.9 11/03/2012 0933   MCH 31.4 11/03/2012 0933   MCHC 33.7 11/03/2012 0933   RDW 14.2 11/03/2012 0933   LYMPHSABS 1.3 11/03/2012 0933   MONOABS 0.8 11/03/2012 0933   EOSABS 0.9* 11/03/2012 0933   BASOSABS 0.1 11/03/2012 0933    BMP:    Component Value Date/Time   NA 135 11/03/2012 0942   K 3.9 11/03/2012 0942   CL 97 11/03/2012 0942   CO2 29 08/03/2012 1120   GLUCOSE 139* 11/03/2012 0942   BUN 34* 11/03/2012 0942   CREATININE 8.60* 11/03/2012 0942   CALCIUM 7.7* 08/03/2012 1120   GFRNONAA 18* 08/03/2012 1120   GFRAA 20* 08/03/2012 1120    Coagulation: Lab Results  Component Value Date   INR 0.92 02/08/2010   INR 1.0 10/20/2008   No results found for this basename: PTT   Radiology: No results found.   Medical Decision Making  [name] is a 57 y.o. 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  There is no evidence of infection in this patient.  He has no leukocytosis and no left shift in his differential.  The patient does have a pseudoaneurysm in the setting of a diffusely aneurysmal left brachiocephalic arteriovenous fistula.  There are no findings consistent with imminent rupture at this point.  I have recommended elective plication of left brachiocephalic arteriovenous fistula to treat the pseudoaneurysm.  This will be scheduled this Wednesday 25 JUN 14 to accommodate his hemodialysis schedule.  I have given this patient explicit instruction  in regards how to control bleeding from this fistula if bleeding should develop, including compression and tourniquet use.  His hemodialysis unit should avoid cannulated the involved segment until it is addressed on Wednesday.  Thank you for allowing [place] to participate in this patient's care.  Leonides Sake, MD [facility] [place] Office: [phone] Pager: [phone]  11/03/2012, 11:44 AM

## 2012-11-08 ENCOUNTER — Encounter (HOSPITAL_COMMUNITY): Payer: Self-pay | Admitting: Vascular Surgery

## 2012-11-09 LAB — CULTURE, BLOOD (ROUTINE X 2)

## 2012-11-21 ENCOUNTER — Encounter: Payer: Self-pay | Admitting: Vascular Surgery

## 2012-11-22 ENCOUNTER — Ambulatory Visit (INDEPENDENT_AMBULATORY_CARE_PROVIDER_SITE_OTHER): Payer: Medicare Other | Admitting: Vascular Surgery

## 2012-11-22 ENCOUNTER — Encounter: Payer: Self-pay | Admitting: Vascular Surgery

## 2012-11-22 VITALS — BP 177/82 | HR 58 | Temp 97.9°F | Ht 70.0 in | Wt 180.0 lb

## 2012-11-22 DIAGNOSIS — N186 End stage renal disease: Secondary | ICD-10-CM

## 2012-11-22 NOTE — Progress Notes (Signed)
[facility] [place]  Postoperative Access Visit  History of Present Illness  [name] is a 57 y.o. year old male who presents for postoperative follow-up for: Left brachiocephalic arteriovenous fistula pseudoaneurysm plication (Date: 11/06/12).  The patient's wounds are healed.  The patient notes no steal symptoms.  The patient is able to complete their activities of daily living.  The patient's current symptoms are: left arm pain on hemodialysis.  For VQI Use Only  PRE-ADM LIVING: Home  AMB STATUS: Ambulatory  Physical Examination Filed Vitals:   11/22/12 1045  BP: 177/82  Pulse: 58  Temp: 97.9 F (36.6 C)    LUE: Incision is healed, skin feels warm, hand grip is 5/5, sensation in digits is intact, palpable thrill, bruit can be auscultated   Medical Decision Making  [name] is a 57 y.o. year old male who presents s/p Left brachiocephalic arteriovenous fistula pseudoaneurysm plication  The patient's access is ready for use and has been used successfully for the past few weeks.  Thank you for allowing [place] to participate in this patient's care.  Leonides Sake, MD [facility] [place] Office: [phone] Pager: [phone]  11/22/2012, 11:37 AM

## 2012-12-06 ENCOUNTER — Ambulatory Visit: Payer: Medicare Other | Admitting: Vascular Surgery

## 2012-12-22 ENCOUNTER — Encounter (HOSPITAL_COMMUNITY): Payer: Self-pay | Admitting: Emergency Medicine

## 2012-12-22 ENCOUNTER — Emergency Department (HOSPITAL_COMMUNITY): Payer: Medicare Other

## 2012-12-22 ENCOUNTER — Inpatient Hospital Stay (HOSPITAL_COMMUNITY)
Admission: EM | Admit: 2012-12-22 | Discharge: 2012-12-25 | DRG: 202 | Disposition: A | Discharge status: Home Health | Payer: Medicare Other | Attending: Family Medicine | Admitting: Family Medicine

## 2012-12-22 DIAGNOSIS — I10 Essential (primary) hypertension: Secondary | ICD-10-CM

## 2012-12-22 DIAGNOSIS — J209 Acute bronchitis, unspecified: Secondary | ICD-10-CM

## 2012-12-22 DIAGNOSIS — K56609 Unspecified intestinal obstruction, unspecified as to partial versus complete obstruction: Secondary | ICD-10-CM

## 2012-12-22 DIAGNOSIS — A0472 Enterocolitis due to Clostridium difficile, not specified as recurrent: Secondary | ICD-10-CM

## 2012-12-22 DIAGNOSIS — I5189 Other ill-defined heart diseases: Secondary | ICD-10-CM

## 2012-12-22 DIAGNOSIS — N186 End stage renal disease: Secondary | ICD-10-CM

## 2012-12-22 DIAGNOSIS — I519 Heart disease, unspecified: Secondary | ICD-10-CM

## 2012-12-22 DIAGNOSIS — Z23 Encounter for immunization: Secondary | ICD-10-CM

## 2012-12-22 DIAGNOSIS — K859 Acute pancreatitis without necrosis or infection, unspecified: Secondary | ICD-10-CM

## 2012-12-22 DIAGNOSIS — R0602 Shortness of breath: Secondary | ICD-10-CM

## 2012-12-22 DIAGNOSIS — I5023 Acute on chronic systolic (congestive) heart failure: Secondary | ICD-10-CM

## 2012-12-22 DIAGNOSIS — K7460 Unspecified cirrhosis of liver: Secondary | ICD-10-CM | POA: Diagnosis present

## 2012-12-22 DIAGNOSIS — R911 Solitary pulmonary nodule: Secondary | ICD-10-CM | POA: Diagnosis present

## 2012-12-22 DIAGNOSIS — B1920 Unspecified viral hepatitis C without hepatic coma: Secondary | ICD-10-CM | POA: Diagnosis present

## 2012-12-22 DIAGNOSIS — D649 Anemia, unspecified: Secondary | ICD-10-CM

## 2012-12-22 DIAGNOSIS — Z992 Dependence on renal dialysis: Secondary | ICD-10-CM

## 2012-12-22 DIAGNOSIS — E119 Type 2 diabetes mellitus without complications: Secondary | ICD-10-CM

## 2012-12-22 DIAGNOSIS — Z79899 Other long term (current) drug therapy: Secondary | ICD-10-CM

## 2012-12-22 DIAGNOSIS — I509 Heart failure, unspecified: Secondary | ICD-10-CM | POA: Diagnosis present

## 2012-12-22 DIAGNOSIS — I252 Old myocardial infarction: Secondary | ICD-10-CM

## 2012-12-22 DIAGNOSIS — N2581 Secondary hyperparathyroidism of renal origin: Secondary | ICD-10-CM | POA: Diagnosis present

## 2012-12-22 DIAGNOSIS — Z87891 Personal history of nicotine dependence: Secondary | ICD-10-CM

## 2012-12-22 DIAGNOSIS — F1910 Other psychoactive substance abuse, uncomplicated: Secondary | ICD-10-CM | POA: Diagnosis present

## 2012-12-22 DIAGNOSIS — G589 Mononeuropathy, unspecified: Secondary | ICD-10-CM | POA: Diagnosis present

## 2012-12-22 DIAGNOSIS — D6959 Other secondary thrombocytopenia: Secondary | ICD-10-CM | POA: Diagnosis present

## 2012-12-22 DIAGNOSIS — Z7982 Long term (current) use of aspirin: Secondary | ICD-10-CM

## 2012-12-22 DIAGNOSIS — I120 Hypertensive chronic kidney disease with stage 5 chronic kidney disease or end stage renal disease: Secondary | ICD-10-CM | POA: Diagnosis present

## 2012-12-22 DIAGNOSIS — K219 Gastro-esophageal reflux disease without esophagitis: Secondary | ICD-10-CM | POA: Diagnosis present

## 2012-12-22 HISTORY — DX: Dependence on renal dialysis: N18.6

## 2012-12-22 HISTORY — DX: End stage renal disease: Z99.2

## 2012-12-22 HISTORY — DX: Chronic pulmonary edema: J81.1

## 2012-12-22 LAB — BASIC METABOLIC PANEL
BUN: 27 mg/dL — ABNORMAL HIGH (ref 6–23)
Chloride: 97 mEq/L (ref 96–112)
Creatinine, Ser: 7.48 mg/dL — ABNORMAL HIGH (ref 0.50–1.35)
GFR calc Af Amer: 8 mL/min — ABNORMAL LOW (ref >90)
GFR calc non Af Amer: 7 mL/min — ABNORMAL LOW (ref >90)
Glucose, Bld: 91 mg/dL (ref 70–99)
Potassium: 3.8 mEq/L (ref 3.5–5.1)

## 2012-12-22 LAB — CBC WITH DIFFERENTIAL/PLATELET
Basophils Relative: 1 % (ref 0–1)
Eosinophils Absolute: 0.9 10*3/uL — ABNORMAL HIGH (ref 0.0–0.7)
HCT: 29.7 % — ABNORMAL LOW (ref 39.0–52.0)
Hemoglobin: 10.1 g/dL — ABNORMAL LOW (ref 13.0–17.0)
Lymphs Abs: 1 10*3/uL (ref 0.7–4.0)
MCH: 31.3 pg (ref 26.0–34.0)
MCHC: 34 g/dL (ref 30.0–36.0)
Monocytes Absolute: 0.7 10*3/uL (ref 0.1–1.0)
Monocytes Relative: 10 % (ref 3–12)
RBC: 3.23 MIL/uL — ABNORMAL LOW (ref 4.22–5.81)

## 2012-12-22 MED ORDER — SEVELAMER CARBONATE 800 MG PO TABS
1600.0000 mg | ORAL_TABLET | Freq: Three times a day (TID) | ORAL | Status: DC
  Administered 2012-12-22 – 2012-12-25 (×6): 1600 mg via ORAL
  Filled 2012-12-22 (×11): qty 2

## 2012-12-22 MED ORDER — GUAIFENESIN ER 600 MG PO TB12
600.0000 mg | ORAL_TABLET | Freq: Two times a day (BID) | ORAL | Status: DC
  Administered 2012-12-22 – 2012-12-25 (×6): 600 mg via ORAL
  Filled 2012-12-22 (×7): qty 1

## 2012-12-22 MED ORDER — IPRATROPIUM BROMIDE 0.02 % IN SOLN
0.5000 mg | Freq: Three times a day (TID) | RESPIRATORY_TRACT | Status: DC
  Administered 2012-12-23 – 2012-12-25 (×5): 0.5 mg via RESPIRATORY_TRACT
  Filled 2012-12-22 (×6): qty 2.5

## 2012-12-22 MED ORDER — ALBUTEROL SULFATE (5 MG/ML) 0.5% IN NEBU
5.0000 mg | INHALATION_SOLUTION | Freq: Once | RESPIRATORY_TRACT | Status: AC
  Administered 2012-12-22: 5 mg via RESPIRATORY_TRACT
  Filled 2012-12-22: qty 1

## 2012-12-22 MED ORDER — LISINOPRIL 40 MG PO TABS
40.0000 mg | ORAL_TABLET | Freq: Every day | ORAL | Status: DC
  Administered 2012-12-22 – 2012-12-25 (×4): 40 mg via ORAL
  Filled 2012-12-22 (×4): qty 1

## 2012-12-22 MED ORDER — SODIUM CHLORIDE 0.9 % IJ SOLN
3.0000 mL | Freq: Two times a day (BID) | INTRAMUSCULAR | Status: DC
  Administered 2012-12-22 – 2012-12-24 (×5): 3 mL via INTRAVENOUS

## 2012-12-22 MED ORDER — ONDANSETRON HCL 4 MG/2ML IJ SOLN: 4.0000 mg | Freq: Four times a day (QID) | INTRAMUSCULAR | Status: DC | PRN

## 2012-12-22 MED ORDER — HEPARIN SODIUM (PORCINE) 5000 UNIT/ML IJ SOLN
5000.0000 [IU] | Freq: Three times a day (TID) | INTRAMUSCULAR | Status: DC
  Administered 2012-12-22 – 2012-12-25 (×7): 5000 [IU] via SUBCUTANEOUS
  Filled 2012-12-22 (×11): qty 1

## 2012-12-22 MED ORDER — LEVOFLOXACIN IN D5W 750 MG/150ML IV SOLN
750.0000 mg | Freq: Once | INTRAVENOUS | Status: AC
  Administered 2012-12-22: 750 mg via INTRAVENOUS
  Filled 2012-12-22: qty 150

## 2012-12-22 MED ORDER — CARVEDILOL 25 MG PO TABS
25.0000 mg | ORAL_TABLET | Freq: Two times a day (BID) | ORAL | Status: DC
  Administered 2012-12-22 – 2012-12-25 (×6): 25 mg via ORAL
  Filled 2012-12-22 (×8): qty 1

## 2012-12-22 MED ORDER — LEVOFLOXACIN IN D5W 500 MG/100ML IV SOLN
500.0000 mg | INTRAVENOUS | Status: DC
  Administered 2012-12-24: 500 mg via INTRAVENOUS
  Filled 2012-12-22: qty 100

## 2012-12-22 MED ORDER — GUAIFENESIN-DM 100-10 MG/5ML PO SYRP
5.0000 mL | ORAL_SOLUTION | ORAL | Status: DC | PRN
  Administered 2012-12-22 – 2012-12-23 (×2): 5 mL via ORAL
  Filled 2012-12-22 (×2): qty 5

## 2012-12-22 MED ORDER — HYDROCODONE-ACETAMINOPHEN 5-325 MG PO TABS
1.0000 | ORAL_TABLET | ORAL | Status: DC | PRN
  Administered 2012-12-22 – 2012-12-23 (×2): 1 via ORAL
  Filled 2012-12-22 (×2): qty 1

## 2012-12-22 MED ORDER — ACETAMINOPHEN 325 MG PO TABS
650.0000 mg | ORAL_TABLET | Freq: Four times a day (QID) | ORAL | Status: DC | PRN
  Filled 2012-12-22: qty 2

## 2012-12-22 MED ORDER — ASPIRIN EC 325 MG PO TBEC
325.0000 mg | DELAYED_RELEASE_TABLET | Freq: Every day | ORAL | Status: DC
  Administered 2012-12-23 – 2012-12-25 (×3): 325 mg via ORAL
  Filled 2012-12-22 (×4): qty 1

## 2012-12-22 MED ORDER — MIDAZOLAM HCL 2 MG/2ML IJ SOLN: 1.0000 mg | INTRAMUSCULAR | Status: DC | PRN

## 2012-12-22 MED ORDER — ONDANSETRON HCL 4 MG PO TABS: 4.0000 mg | ORAL_TABLET | Freq: Four times a day (QID) | ORAL | Status: DC | PRN

## 2012-12-22 MED ORDER — IPRATROPIUM BROMIDE 0.02 % IN SOLN
0.5000 mg | Freq: Four times a day (QID) | RESPIRATORY_TRACT | Status: DC
  Administered 2012-12-22: 0.5 mg via RESPIRATORY_TRACT
  Filled 2012-12-22: qty 2.5

## 2012-12-22 MED ORDER — AMLODIPINE BESYLATE 10 MG PO TABS
10.0000 mg | ORAL_TABLET | Freq: Every day | ORAL | Status: DC
  Administered 2012-12-22 – 2012-12-24 (×3): 10 mg via ORAL
  Filled 2012-12-22 (×4): qty 1

## 2012-12-22 MED ORDER — ALBUTEROL SULFATE (5 MG/ML) 0.5% IN NEBU
2.5000 mg | INHALATION_SOLUTION | Freq: Four times a day (QID) | RESPIRATORY_TRACT | Status: DC | PRN
  Administered 2012-12-23 (×2): 2.5 mg via RESPIRATORY_TRACT
  Filled 2012-12-22 (×2): qty 0.5

## 2012-12-22 MED ORDER — LOSARTAN POTASSIUM 50 MG PO TABS
100.0000 mg | ORAL_TABLET | Freq: Every day | ORAL | Status: DC
  Administered 2012-12-22 – 2012-12-24 (×3): 100 mg via ORAL
  Filled 2012-12-22 (×4): qty 2

## 2012-12-22 MED ORDER — RENA-VITE PO TABS
1.0000 | ORAL_TABLET | Freq: Every day | ORAL | Status: DC
  Administered 2012-12-22 – 2012-12-25 (×4): 1 via ORAL
  Filled 2012-12-22 (×4): qty 1

## 2012-12-22 MED ORDER — LEVOFLOXACIN IN D5W 750 MG/150ML IV SOLN
750.0000 mg | INTRAVENOUS | Status: DC
  Filled 2012-12-22: qty 150

## 2012-12-22 MED ORDER — FENTANYL CITRATE 0.05 MG/ML IJ SOLN: 50.0000 ug | Freq: Once | INTRAMUSCULAR | Status: DC

## 2012-12-22 MED ORDER — MORPHINE SULFATE 2 MG/ML IJ SOLN: 1.0000 mg | INTRAMUSCULAR | Status: DC | PRN

## 2012-12-22 MED ORDER — ALBUTEROL SULFATE (5 MG/ML) 0.5% IN NEBU
2.5000 mg | INHALATION_SOLUTION | Freq: Three times a day (TID) | RESPIRATORY_TRACT | Status: DC
  Administered 2012-12-23 – 2012-12-25 (×5): 2.5 mg via RESPIRATORY_TRACT
  Filled 2012-12-22 (×6): qty 0.5

## 2012-12-22 MED ORDER — ACETAMINOPHEN 650 MG RE SUPP: 650.0000 mg | Freq: Four times a day (QID) | RECTAL | Status: DC | PRN

## 2012-12-22 NOTE — ED Notes (Addendum)
Received pt via EMS with c/o center CP onset yesterday after dialysis. Pt also c/o shortness of breath. Pt given 241 mg of ASA by EMS, pt takes 81 mg of ASA daily and 1 neb treatment of albuterol by EMS. Pt talking in complete sentences without difficulty. Pt dialysis days are T,TH,Sa

## 2012-12-22 NOTE — Consult Note (Signed)
ANTIBIOTIC CONSULT NOTE - INITIAL  Pharmacy Consult for Levaquin Indication: acute bronchitis  No Known Allergies  Patient Measurements: Height: 5\' 10"  (177.8 cm) Weight: 179 lb 7.3 oz (81.4 kg) IBW/kg (Calculated) : 73  Vital Signs: Temp: 98.7 F (37.1 C) (08/10 1849) Temp src: Oral (08/10 1455) BP: 152/65 mmHg (08/10 1849) Pulse Rate: 63 (08/10 1849) Intake/Output from previous day:   Intake/Output from this shift:    Labs:  Recent Labs  12/22/12 1529  WBC 7.3  HGB 10.1*  PLT 102*  CREATININE 7.48*   Estimated Creatinine Clearance: 11.3 ml/min (by C-G formula based on Cr of 7.48).  Microbiology: No results found for this or any previous visit (from the past 720 hour(s)).  Medical History: Past Medical History  Diagnosis Date  . Myocardial infarction   . Hypertension   . Diabetes mellitus   . Chronic kidney disease   . GERD (gastroesophageal reflux disease)   . CHF (congestive heart failure)   . Neuropathy     Hx: of in toes  . Hepatitis     Hc: of Hep C  . Cataracts, both eyes     Hx: of "slightly"  . Pancreatitis     Hx: of   Assessment: 57yom with ESRD on HD to begin levaquin for acute bronchitis.  Goal of Therapy:  Appropriate levaquin dosing  Plan:  1) Levaquin 750mg  IV x 1 then 500mg  IV q48  Fredrik Rigger 12/22/2012,7:05 PM

## 2012-12-22 NOTE — H&P (Signed)
[facility] History and Physical  [name] ZOX:[id number] DOB: 1956/02/08 DOA: 12/22/2012  Referring physician: Dorthula Matas, PA-C PCP: [name], MD   Chief Complaint: Shortness of breath  HPI: [name] is a 57 y.o. male with past medical history of ESRD on dialysis, hypertension and hepatitis C. Patient given to the hospital because of shortness of breath. Patient complains started about 3 days ago, he also noticed some cough and clear phlegm production. Yesterday he has chills and sweating. He has central chest pain associated with cough on taking deep breath. No radiation. He also mention symptoms consistent with orthopnea. His symptoms did not improve with dialysis yesterday, for the past week he did not miss his dialysis. His symptoms worsened since yesterday so he came in to the ER for further evaluation. In the ED chest x-ray showed prominence of hilar structures greater on the right which appears stable since last study. There is no infiltrates or pneumothorax. His proBNP was found to be greater than 20,000. Patient admitted to the hospital for further evaluation.  Review of Systems:  Constitutional: negative for anorexia, fevers and sweats Eyes: negative for irritation, redness and visual disturbance Ears, nose, mouth, throat, and face: negative for earaches, epistaxis, nasal congestion and sore throat Respiratory: Has cough, dyspnea on exertion, sputum, denies wheezing Cardiovascular: Has shortness of breath and pleuritic chest pain, and orthopnea. Gastrointestinal: negative for abdominal pain, constipation, diarrhea, melena, nausea and vomiting Genitourinary:negative for dysuria, frequency and hematuria Hematologic/lymphatic: negative for bleeding, easy bruising and lymphadenopathy Musculoskeletal:negative for arthralgias, muscle weakness and stiff joints Neurological: negative for coordination problems, gait problems, headaches and  weakness Endocrine: negative for diabetic symptoms including polydipsia, polyuria and weight loss Allergic/Immunologic: negative for anaphylaxis, hay fever and urticaria   Past Medical History  Diagnosis Date  . Myocardial infarction   . Hypertension   . Diabetes mellitus   . Chronic kidney disease   . GERD (gastroesophageal reflux disease)   . CHF (congestive heart failure)   . Neuropathy     Hx: of in toes  . Hepatitis     Hc: of Hep C  . Cataracts, both eyes     Hx: of "slightly"  . Pancreatitis     Hx: of   Past Surgical History  Procedure Laterality Date  . Dialysis fistula creation Left   . Cholecystectomy    . Colonoscopy      Hx: of  . Ligation of arteriovenous  fistula Left 11/06/2012    Procedure: PLICATION OF BRACHIO-CEPHALIC ARTERIOVENOUS  FISTULA;  Surgeon: Fransisco Hertz, MD;  Location: [facility];  Service: Vascular;  Laterality: Left;   Social History:  reports that he quit smoking 6 days ago. His smoking use included Cigarettes. He has a 21.5 pack-year smoking history. He has never used smokeless tobacco. He reports that he does not drink alcohol or use illicit drugs.  No Known Allergies  Family History  Problem Relation Age of Onset  . Diabetes Mother    Prior to Admission medications   Medication Sig Start Date End Date Taking? Authorizing Provider  amLODipine (NORVASC) 10 MG tablet Take 10 mg by mouth at bedtime.   Yes Historical Provider, MD  aspirin 325 MG EC tablet Take 325 mg by mouth daily.   Yes Historical Provider, MD  b complex-vitamin c-folic acid (NEPHRO-VITE) 0.8 MG TABS Take 0.8 mg by mouth at bedtime.   Yes Historical Provider, MD  carvedilol (COREG) 25 MG tablet Take 25 mg by mouth  2 (two) times daily with a meal.   Yes Historical Provider, MD  lisinopril (PRINIVIL,ZESTRIL) 40 MG tablet Take 40 mg by mouth daily.   Yes Historical Provider, MD  losartan (COZAAR) 100 MG tablet Take 100 mg by mouth at bedtime.   Yes Historical Provider, MD   sevelamer (RENVELA) 800 MG tablet Take 2 tablets (1,600 mg total) by mouth 3 (three) times daily with meals. 02/08/12  Yes Zannie Cove, MD   Physical Exam: Filed Vitals:   12/22/12 1803  BP: 160/74  Pulse: 66  Temp:   Resp: 18   General appearance: alert, cooperative and no distress  Head: Normocephalic, without obvious abnormality, atraumatic  Eyes: conjunctivae/corneas clear. PERRL, EOM's intact. Fundi benign.  Nose: Nares normal. Septum midline. Mucosa normal. No drainage or sinus tenderness.  Throat: lips, mucosa, and tongue normal; teeth and gums normal  Neck: Supple, no masses, no cervical lymphadenopathy, no JVD appreciated, no meningeal signs Resp: Rhonchorous breath sounds on the right lung field Chest wall: no tenderness  Cardio: regular rate and rhythm, S1, S2 normal, no murmur, click, rub or gallop  GI: soft, non-tender; bowel sounds normal; no masses, no organomegaly  Extremities: extremities normal, atraumatic, no cyanosis or edema  Skin: Skin color, texture, turgor normal. No rashes or lesions  Neurologic: Alert and oriented X 3, normal strength and tone. Normal symmetric reflexes. Normal coordination and gait   Labs on Admission:  Basic Metabolic Panel:  Recent Labs Lab 12/22/12 1529  NA 135  K 3.8  CL 97  CO2 26  GLUCOSE 91  BUN 27*  CREATININE 7.48*  CALCIUM 7.9*   Liver Function Tests: No results found for this basename: AST, ALT, ALKPHOS, BILITOT, PROT, ALBUMIN,  in the last 168 hours No results found for this basename: LIPASE, AMYLASE,  in the last 168 hours No results found for this basename: AMMONIA,  in the last 168 hours CBC:  Recent Labs Lab 12/22/12 1529  WBC 7.3  NEUTROABS 4.7  HGB 10.1*  HCT 29.7*  MCV 92.0  PLT 102*   Cardiac Enzymes: No results found for this basename: CKTOTAL, CKMB, CKMBINDEX, TROPONINI,  in the last 168 hours  BNP (last 3 results)  Recent Labs  12/22/12 1529  PROBNP 20762.0*   CBG: No results  found for this basename: GLUCAP,  in the last 168 hours  Radiological Exams on Admission: Dg Chest 2 View  12/22/2012   *RADIOLOGY REPORT*  Clinical Data: Chest pain. Shortness of breath.  Diabetic hypertensive patient.  CHEST - 2 VIEW  Comparison: 11/06/2012 and 06/30/2006.  Findings: Prominence hilar structures greater right appears stable. Kinked stent left axillary artery region unchanged.  No infiltrate or pneumothorax.  Cardiomegaly  Mildly tortuous aorta.  IMPRESSION: Prominence hilar structures greater on the right appears stable.  No infiltrate or pneumothorax.  Cardiomegaly  Mildly tortuous aorta.   Original Report Authenticated By: Lacy Duverney, M.D.    EKG: Independently reviewed.  Assessment/Plan Principal Problem:   SOB (shortness of breath) Active Problems:   HTN (hypertension)   DM type 2 (diabetes mellitus, type 2)   End stage renal disease   Acute bronchitis   Shortness of breath -From the history this is might be multifactorial, likely mild fluid over load and acute bronchitis. -Although his proBNP is high and patient describe orthopnea, clinically he does not look significantly fluid overloaded. -Has low-grade fever, sputum production and pleuritic chest pain might be consistent with acute bronchitis. -I will check 2-D echocardiogram, check CT  angio of the chest to rule out pulmonary etiology. -Nephrology was already consulted for dialysis. -Started patient on antibiotics for acute bronchitis.  Acute bronchitis  -This is the working diagnosis as patient has fever, sputum production and pleuritic chest pain. -I started patient on Levaquin, CT angio to follow. -As mentioned above his clinical examination does not support fluid overload. -His chest x-ray does not show pulmonary edema, does not have lower extremity edema or sacral edema. -Patient reports that his dry weight after dialysis yesterday was less than the ideal.  ESRD -Nephrology consulted for  dialysis.  Diabetes mellitus type 2 -Is not on any oral hypoglycemic agents, check hemoglobin A1c.  Hypertension -Seems to be severe and difficult to control with patient on focal pressure medications. -Restart blood pressure medications including Coreg, amlodipine, losartan and lisinopril.  Hepatitis C -No confirmation in the medical records, but is profile as a diagnosis. -Has thrombocytopenia likely related to hepatitis C. I will check INR.  Code Status: Full code Family Communication: Plan discussed with the patient Disposition Plan: Telemetry, inpatient, anticipate length of stay to be greater than 2 midnights  Time spent: 70 minutes  [facility] Pager [phone]  If 7PM-7AM, please contact night-coverage [email or web address] Password [facility] 12/22/2012, 6:23 PM

## 2012-12-22 NOTE — ED Provider Notes (Signed)
CSN: [id number]     Arrival date & time 12/22/12  1447 History     First MD Initiated Contact with Patient 12/22/12 1504     Chief Complaint  Patient presents with  . Chest Pain  . Shortness of Breath   (Consider location/radiation/quality/duration/timing/severity/associated sxs/prior Treatment) HPI  [name] is a 57 y.o.male with a significant PMH of MI, hypertension, diabetes mellitus, ESRD, GERD, CHF, pancreatitis, neuropathy, hepatitis, pancreatitis presents to the ER with complaints of SOB and chest pain/pressure since Thursday (4 days ago) while he was at dialysis. He says that the pain and SOB has been constant since then and he has to sit up to be able to breath. He is unable to lay flat. He denies dialysis helping his symptoms. He dialysis on T, TH and Sat. He denies URI symptoms of fevers, tachycardia, weakness, coughing, chills, diarrhea, vomiting, or abdominal pains.   Past Medical History  Diagnosis Date  . Myocardial infarction   . Hypertension   . Diabetes mellitus   . Chronic kidney disease   . GERD (gastroesophageal reflux disease)   . CHF (congestive heart failure)   . Neuropathy     Hx: of in toes  . Hepatitis     Hc: of Hep C  . Cataracts, both eyes     Hx: of "slightly"  . Pancreatitis     Hx: of   Past Surgical History  Procedure Laterality Date  . Dialysis fistula creation Left   . Cholecystectomy    . Colonoscopy      Hx: of  . Ligation of arteriovenous  fistula Left 11/06/2012    Procedure: PLICATION OF BRACHIO-CEPHALIC ARTERIOVENOUS  FISTULA;  Surgeon: Fransisco Hertz, MD;  Location: [facility] OR;  Service: Vascular;  Laterality: Left;   Family History  Problem Relation Age of Onset  . Diabetes Mother    History  Substance Use Topics  . Smoking status: Former Smoker -- 0.50 packs/day for 43 years    Types: Cigarettes    Quit date: 12/16/2012  . Smokeless tobacco: Never Used  . Alcohol Use: No    Review of Systems  ROS is negative unless  otherwise stated in the HPI   Allergies  Review of patient's allergies indicates no known allergies.  Home Medications   Current Outpatient Rx  Name  Route  Sig  Dispense  Refill  . amLODipine (NORVASC) 10 MG tablet   Oral   Take 10 mg by mouth at bedtime.         Marland Kitchen aspirin 325 MG EC tablet   Oral   Take 325 mg by mouth daily.         Marland Kitchen b complex-vitamin c-folic acid (NEPHRO-VITE) 0.8 MG TABS   Oral   Take 0.8 mg by mouth at bedtime.         . carvedilol (COREG) 25 MG tablet   Oral   Take 25 mg by mouth 2 (two) times daily with a meal.         . lisinopril (PRINIVIL,ZESTRIL) 40 MG tablet   Oral   Take 40 mg by mouth daily.         Marland Kitchen losartan (COZAAR) 100 MG tablet   Oral   Take 100 mg by mouth at bedtime.         . sevelamer (RENVELA) 800 MG tablet   Oral   Take 2 tablets (1,600 mg total) by mouth 3 (three) times daily with meals.   60 tablet  0    BP 153/96  Pulse 50  Temp(Src) 99 F (37.2 C) (Oral)  Resp 20  Ht 5\' 10"  (1.778 m)  Wt 180 lb (81.647 kg)  BMI 25.83 kg/m2  SpO2 100% Physical Exam  Nursing note and vitals reviewed. Constitutional: He appears well-developed and well-nourished. No distress.  HENT:  Head: Normocephalic and atraumatic.  Eyes: Pupils are equal, round, and reactive to light.  Neck: Normal range of motion. Neck supple.  Cardiovascular: Normal rate and regular rhythm.  Exam reveals gallop.   Pulmonary/Chest: Effort normal. No respiratory distress. He has no wheezes. He has no rhonchi. He has rales.  Pt is able to speak in complete sentences  Abdominal: Soft.  Neurological: He is alert.  Skin: Skin is warm and dry.    ED Course   Procedures (including critical care time)  Labs Reviewed  CBC WITH DIFFERENTIAL - Abnormal; Notable for the following:    RBC 3.23 (*)    Hemoglobin 10.1 (*)    HCT 29.7 (*)    Platelets 102 (*)    Eosinophils Relative 12 (*)    Eosinophils Absolute 0.9 (*)    All other components  within normal limits  BASIC METABOLIC PANEL - Abnormal; Notable for the following:    BUN 27 (*)    Creatinine, Ser 7.48 (*)    Calcium 7.9 (*)    GFR calc non Af Amer 7 (*)    GFR calc Af Amer 8 (*)    All other components within normal limits  PRO B NATRIURETIC PEPTIDE - Abnormal; Notable for the following:    Pro B Natriuretic peptide (BNP) 20762.0 (*)    All other components within normal limits   Dg Chest 2 View  12/22/2012   *RADIOLOGY REPORT*  Clinical Data: Chest pain. Shortness of breath.  Diabetic hypertensive patient.  CHEST - 2 VIEW  Comparison: 11/06/2012 and 06/30/2006.  Findings: Prominence hilar structures greater right appears stable. Kinked stent left axillary artery region unchanged.  No infiltrate or pneumothorax.  Cardiomegaly  Mildly tortuous aorta.  IMPRESSION: Prominence hilar structures greater on the right appears stable.  No infiltrate or pneumothorax.  Cardiomegaly  Mildly tortuous aorta.   Original Report Authenticated By: Lacy Duverney, M.D.   1. CHF (congestive heart failure), acute on chronic, systolic     MDM   Date: 12/22/2012  Rate: 60  Rhythm: sinus rhythm  QRS Axis: normal  Intervals: PR prolonged  ST/T Wave abnormalities: normal  Conduction Disutrbances:right bundle branch block  Narrative Interpretation:   Old EKG Reviewed: unchanged from march 2014  Chest xray does not show diffuse fluids to lungs. However, BNP is elevated and patient is symptomatic. Medicine to admit and Nephrology to see and dialyze tomorrow.  Inpatient, Tele, [facility], [facility].    Dorthula Matas, PA-C 12/22/12 1736

## 2012-12-23 ENCOUNTER — Encounter (HOSPITAL_COMMUNITY): Payer: Self-pay | Admitting: Radiology

## 2012-12-23 ENCOUNTER — Inpatient Hospital Stay (HOSPITAL_COMMUNITY): Payer: Medicare Other

## 2012-12-23 DIAGNOSIS — N186 End stage renal disease: Secondary | ICD-10-CM

## 2012-12-23 LAB — CBC
Hemoglobin: 9.6 g/dL — ABNORMAL LOW (ref 13.0–17.0)
MCH: 31.4 pg (ref 26.0–34.0)
MCV: 91.5 fL (ref 78.0–100.0)
RBC: 3.06 MIL/uL — ABNORMAL LOW (ref 4.22–5.81)

## 2012-12-23 LAB — BASIC METABOLIC PANEL
CO2: 25 mEq/L (ref 19–32)
Glucose, Bld: 109 mg/dL — ABNORMAL HIGH (ref 70–99)
Potassium: 3.8 mEq/L (ref 3.5–5.1)
Sodium: 135 mEq/L (ref 135–145)

## 2012-12-23 LAB — HEMOGLOBIN A1C: Mean Plasma Glucose: 123 mg/dL — ABNORMAL HIGH (ref <117)

## 2012-12-23 LAB — GLUCOSE, CAPILLARY: Glucose-Capillary: 113 mg/dL — ABNORMAL HIGH (ref 70–99)

## 2012-12-23 MED ORDER — IOHEXOL 350 MG/ML SOLN
80.0000 mL | Freq: Once | INTRAVENOUS | Status: AC | PRN
  Administered 2012-12-23: 75 mL via INTRAVENOUS

## 2012-12-23 MED ORDER — DARBEPOETIN ALFA-POLYSORBATE 60 MCG/0.3ML IJ SOLN
60.0000 ug | INTRAMUSCULAR | Status: DC
  Administered 2012-12-24: 60 ug via INTRAVENOUS
  Filled 2012-12-23: qty 0.3

## 2012-12-23 MED ORDER — ZOLPIDEM TARTRATE 5 MG PO TABS
5.0000 mg | ORAL_TABLET | Freq: Once | ORAL | Status: AC
  Administered 2012-12-23: 5 mg via ORAL
  Filled 2012-12-23: qty 1

## 2012-12-23 MED ORDER — INSULIN ASPART 100 UNIT/ML ~~LOC~~ SOLN
0.0000 [IU] | Freq: Three times a day (TID) | SUBCUTANEOUS | Status: DC
  Administered 2012-12-24 – 2012-12-25 (×2): 3 [IU] via SUBCUTANEOUS

## 2012-12-23 MED ORDER — INSULIN ASPART 100 UNIT/ML ~~LOC~~ SOLN
0.0000 [IU] | Freq: Every day | SUBCUTANEOUS | Status: DC
  Administered 2012-12-24: 2 [IU] via SUBCUTANEOUS

## 2012-12-23 NOTE — ED Provider Notes (Signed)
Medical screening examination/treatment/procedure(s) were performed by non-physician practitioner and as supervising physician I was immediately available for consultation/collaboration.   David H Yao, MD 12/23/12 0004 

## 2012-12-23 NOTE — Progress Notes (Signed)
Pt refused vitals sign and blood sugar check.

## 2012-12-23 NOTE — Progress Notes (Signed)
[facility] PROGRESS NOTE  [name] [name] DOB: [birth year] DOA: 12/22/2012 PCP: Jerome Aver, MD  Assessment/Plan: Shortness of breath  -From the history this is might be multifactorial, likely mild fluid over load and acute bronchitis.  -Although his proBNP is high and patient describe orthopnea, clinically he does not look significantly fluid overloaded - elevated BNP in setting of ESRD -Chest CT with findings suspicious for infectious lung disease.  -Follow up 2-D echocardiogram.  -Nephrology was already consulted for dialysis.  -Started on empiric levaquin for bronchitis (see below) Acute bronchitis vs early HCAP - Noted to have fever, sputum production and pleuritic chest pain.  -started patient on Levaquin empirically yesterday - Presently afebrile with no leukocytosis. Consider continuing course for now. ESRD  -Nephrology consulted for dialysis.  Diabetes mellitus type 2  -Is not on any oral hypoglycemic agents, check hemoglobin A1c.  Hypertension  -Presently stable. -Cont blood pressure medications including Coreg, amlodipine, losartan and lisinopril.  Hepatitis C  -No confirmation in the medical records, but is profile as a diagnosis.  -Has thrombocytopenia likely related to hepatitis C Lung Nodule - Incidental finding on CTA - Recommendations for follow up CT in 3 months  Code Status: Full Family Communication: Pt in room (indicate person spoken with, relationship, and if by phone, the number) Disposition Plan: Pending   Consultants:  Nephrology  Antibiotics:  Levaquin - 8/10>>>  HPI/Subjective: Reports feeling slightly better this AM. No more chills  Objective: Filed Vitals:   12/22/12 1849 12/22/12 2043 12/23/12 0049 12/23/12 0500  BP: 152/65 150/67 153/79 169/94  Pulse: 63 58 61 56  Temp: 98.7 F (37.1 C) 98 F (36.7 C) 98.1 F (36.7 C) 98.3 F (36.8 C)  TempSrc:  Oral Oral Oral  Resp:  18 18 14   Height: 5\' 10"   (1.778 m)     Weight: 81.4 kg (179 lb 7.3 oz)  82 kg (180 lb 12.4 oz)   SpO2: 100% 100% 99% 98%   No intake or output data in the 24 hours ending 12/23/12 0830 Filed Weights   12/22/12 1452 12/22/12 1849 12/23/12 0049  Weight: 81.647 kg (180 lb) 81.4 kg (179 lb 7.3 oz) 82 kg (180 lb 12.4 oz)    Exam:   General:  Awake, in nad  Cardiovascular: regular, s1, s2  Respiratory: normal resp effort, coarse breath sounds  Abdomen: soft, nondistended  Musculoskeletal: perfused, no clubbing or cyanosis   Data Reviewed: Basic Metabolic Panel:  Recent Labs Lab 12/22/12 1529 12/23/12 0420  NA 135 135  K 3.8 3.8  CL 97 95*  CO2 26 25  GLUCOSE 91 109*  BUN 27* 38*  CREATININE 7.48* 9.04*  CALCIUM 7.9* 7.4*   Liver Function Tests: No results found for this basename: AST, ALT, ALKPHOS, BILITOT, PROT, ALBUMIN,  in the last 168 hours No results found for this basename: LIPASE, AMYLASE,  in the last 168 hours No results found for this basename: AMMONIA,  in the last 168 hours CBC:  Recent Labs Lab 12/22/12 1529 12/23/12 0420  WBC 7.3 5.1  NEUTROABS 4.7  --   HGB 10.1* 9.6*  HCT 29.7* 28.0*  MCV 92.0 91.5  PLT 102* 81*   Cardiac Enzymes:  Recent Labs Lab 12/22/12 1915 12/23/12 0033  TROPONINI <0.30 <0.30   BNP (last 3 results)  Recent Labs  12/22/12 1529  PROBNP 20762.0*   CBG: No results found for this basename: GLUCAP,  in the last 168 hours  No results found for this  or any previous visit (from the past 240 hour(s)).   Studies: Dg Chest 2 View  12/22/2012   *RADIOLOGY REPORT*  Clinical Data: Chest pain. Shortness of breath.  Diabetic hypertensive patient.  CHEST - 2 VIEW  Comparison: 11/06/2012 and 06/30/2006.  Findings: Prominence hilar structures greater right appears stable. Kinked stent left axillary artery region unchanged.  No infiltrate or pneumothorax.  Cardiomegaly  Mildly tortuous aorta.  IMPRESSION: Prominence hilar structures greater on the right  appears stable.  No infiltrate or pneumothorax.  Cardiomegaly  Mildly tortuous aorta.   Original Report Authenticated By: [name], M.D.   Ct Angio Chest Pe W/cm &/or Wo Cm  12/23/2012   *RADIOLOGY REPORT*  Clinical Data: Shortness of breath.  CT ANGIOGRAPHY CHEST  Technique:  Multidetector CT imaging of the chest using the standard protocol during bolus administration of intravenous contrast. Multiplanar reconstructed images including MIPs were obtained and reviewed to evaluate the vascular anatomy.  Contrast: 75mL OMNIPAQUE IOHEXOL 350 MG/ML SOLN  Comparison: Chest c t 02/08/2010.  Findings:  Mediastinum: There are no filling defects within the pulmonary arterial tree to suggest underlying pulmonary embolism.  Partial anomalous pulmonary venous return is noted with the majority of the venous drainage from the left upper lobe extending up to the left innominate vein.  Associated with this, there is cardiomegaly with massive right heart dilatation.  There is a right to left bowing of the interatrial septum, and marked straightening of the interventricular septum, suggesting elevated right-sided heart pressures.  Some right ventricular hypertrophy is also noted. Dilatation of the pulmonic trunk (3.7 cm in diameter) as well as the pulmonary arteries bilaterally (2.9 cm on the right and 2.8 cm on the left), compatible with pulmonary arterial hypertension. No pathologically enlarged mediastinal or hilar lymph nodes. Esophagus is unremarkable in appearance.  Lungs/Pleura: There is a small amount of ground-glass attenuation in the right lower lobe, particularly posterolaterally where there is a 2.5 cm ground-glass attenuation nodule (image 80 of series 80) which is new compared to prior examination.  No other definite suspicious-appearing pulmonary nodules or masses are identified. Mild centrilobular and paraseptal emphysema, most pronounced in the lung apices.  No pleural effusions.  Upper Abdomen: Unremarkable.   Musculoskeletal: There are no aggressive appearing lytic or blastic lesions noted in the visualized portions of the skeleton.  Old healed fracture of the lateral aspect of the right seventh rib incidentally noted.  IMPRESSION: 1.  No evidence of pulmonary embolism. 2.  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 3.  In addition, there is some ground-glass attenuation in the right lower lobe which is favored to be of infectious or inflammatory etiology.  However, one of these areas of ground-glass is somewhat nodular in appearance measuring up to 2.5 x 2.5 cm (image 80 of series 8). The possibility of a neoplasm such as adenocarcinoma is not excluded.  Initial follow-up by chest CT without contrast is recommended in 3 months to confirm persistence. This recommendation follows the consensus statement: Recommendations for the Management of Subsolid Pulmonary Nodules Detected at CT:  A Statement from the Fleischner Society as published in Radiology 2013; 266:304-317. 4.  Mild centrilobular paraseptal emphysema most pronounced in the lung apices.   Original Report Authenticated By: Trudie Reed, M.D.    Scheduled Meds: . albuterol  2.5 mg Nebulization TID  . amLODipine  10 mg Oral QHS  . aspirin  325 mg Oral Daily  . carvedilol  25 mg Oral BID WC  . fentaNYL  50-100 mcg Intravenous Once  . guaiFENesin  600 mg Oral BID  . heparin  5,000 Units Subcutaneous Q8H  . ipratropium  0.5 mg Nebulization TID  . [START ON 12/24/2012] levofloxacin (LEVAQUIN) IV  500 mg Intravenous Q48H  . lisinopril  40 mg Oral Daily  . losartan  100 mg Oral QHS  . multivitamin  1 tablet Oral Daily  . sevelamer carbonate  1,600 mg Oral TID WC  . sodium chloride  3 mL Intravenous Q12H   Continuous Infusions:   Principal Problem:   SOB (shortness of breath) Active Problems:   HTN (hypertension)   DM type  2 (diabetes mellitus, type 2)   End stage renal disease   Acute bronchitis   Time spent:   Verdie Barrows K  [facility] Pager [phone]. If 7PM-7AM, please contact night-coverage at [email or web address], password [facility] 12/23/2012, 8:30 AM  LOS: 1 day

## 2012-12-23 NOTE — Consult Note (Signed)
[facility] Renal Consultation Note  Indication for Consultation:  Management of ESRD/hemodialysis; anemia, hypertension/volume and secondary hyperparathyroidism  HPI: [name] is a 57 y.o. male with ESRD on dialysis on TTS at the [facility] who presented to the ED yesterday with worsening dyspnea and cough with clear-to-sputum sputum.  He denies any fever or chills, but states that he was "coming down with something" with a sore throat after dialysis on 8/9.  Chest x-ray showed prominence of hilar structures greater on the right, but no edema.   He feels better after nebulizer treatment and has started IV Levaquin for acute bronchitis.  He has been compliant with dialysis and reached his dry weight goal at his last dialysis on 8/9.    Dialysis Orders:   TTS @ [facility] 80.5 kg   3:45   500/800   2K/2Ca   Heparin 2400 U    AVF @ LUA       Hectorol 0    Epogen 2200 U    Venofer 50 mg qwk  Past Medical History  Diagnosis Date  . Myocardial infarction   . Hypertension   . Diabetes mellitus   . 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  Started HD in 2007, gets HD at [facility] on TTS schedule.    Marland Kitchen GERD (gastroesophageal reflux disease)   . Pulmonary edema     Just prior to starting HD and resolved with HD. ECHO in 2008 showed normal LVEF, mild diiastolic dysfunction.   . Neuropathy     Hx: of in toes  . Hepatitis     Hc: of Hep C  . Cataracts, both eyes     Hx: of "slightly"  . Pancreatitis     Hx: of   Past Surgical History  Procedure Laterality Date  . Dialysis fistula creation Left   . Cholecystectomy    . Colonoscopy      Hx: of  . Ligation of arteriovenous  fistula Left 11/06/2012    Procedure: PLICATION OF BRACHIO-CEPHALIC ARTERIOVENOUS  FISTULA;  Surgeon: Fransisco Hertz, MD;  Location: [facility] OR;  Service: Vascular;  Laterality: Left;   Family History  Problem Relation Age of Onset  . Diabetes Mother    Social  History He states that he quit smoking cigarettes one week ago after smoking about five a days for many years.  He also recently quit using marijuana and quit other illicit drugs years ago.  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  No Known Allergies Prior to Admission medications   Medication Sig Start Date End Date Taking? Authorizing Provider  amLODipine (NORVASC) 10 MG tablet Take 10 mg by mouth at bedtime.   Yes Historical Provider, MD  aspirin 325 MG EC tablet Take 325 mg by mouth daily.   Yes Historical Provider, MD  b complex-vitamin c-folic acid (NEPHRO-VITE) 0.8 MG TABS Take 0.8 mg by mouth at bedtime.   Yes Historical Provider, MD  carvedilol (COREG) 25 MG tablet Take 25 mg by mouth 2 (two) times daily with a meal.   Yes Historical Provider, MD  lisinopril (PRINIVIL,ZESTRIL) 40 MG tablet Take 40 mg by mouth daily.   Yes Historical Provider, MD  losartan (COZAAR) 100 MG tablet Take 100 mg by mouth at bedtime.   Yes Historical Provider, MD  sevelamer (RENVELA) 800 MG tablet Take 2 tablets (1,600 mg total) by mouth 3 (  three) times daily with meals. 02/08/12  Yes Zannie Cove, MD   Labs:  Results for orders placed during the hospital encounter of 12/22/12 (from the past 48 hour(s))  CBC WITH DIFFERENTIAL     Status: Abnormal   Collection Time    12/22/12  3:29 PM      Result Value Range   WBC 7.3  4.0 - 10.5 K/uL   RBC 3.23 (*) 4.22 - 5.81 MIL/uL   Hemoglobin 10.1 (*) 13.0 - 17.0 g/dL   HCT 16.1 (*) 09.6 - 04.5 %   MCV 92.0  78.0 - 100.0 fL   MCH 31.3  26.0 - 34.0 pg   MCHC 34.0  30.0 - 36.0 g/dL   RDW 40.9  81.1 - 91.4 %   Platelets 102 (*) 150 - 400 K/uL   Comment: PLATELET COUNT CONFIRMED BY SMEAR   Neutrophils Relative % 64  43 - 77 %   Neutro Abs 4.7  1.7 - 7.7 K/uL   Lymphocytes Relative 13  12 - 46 %   Lymphs Abs 1.0  0.7 - 4.0 K/uL   Monocytes Relative 10  3 - 12 %   Monocytes Absolute 0.7  0.1 - 1.0 K/uL   Eosinophils Relative 12  (*) 0 - 5 %   Eosinophils Absolute 0.9 (*) 0.0 - 0.7 K/uL   Basophils Relative 1  0 - 1 %   Basophils Absolute 0.1  0.0 - 0.1 K/uL  BASIC METABOLIC PANEL     Status: Abnormal   Collection Time    12/22/12  3:29 PM      Result Value Range   Sodium 135  135 - 145 mEq/L   Potassium 3.8  3.5 - 5.1 mEq/L   Chloride 97  96 - 112 mEq/L   CO2 26  19 - 32 mEq/L   Glucose, Bld 91  70 - 99 mg/dL   BUN 27 (*) 6 - 23 mg/dL   Creatinine, Ser 7.82 (*) 0.50 - 1.35 mg/dL   Calcium 7.9 (*) 8.4 - 10.5 mg/dL   GFR calc non Af Amer 7 (*) >90 mL/min   GFR calc Af Amer 8 (*) >90 mL/min   Comment:            The eGFR has been calculated     using the CKD EPI equation.     This calculation has not been     validated in all clinical     situations.     eGFR's persistently     <90 mL/min signify     possible Chronic Kidney Disease.  PRO B NATRIURETIC PEPTIDE     Status: Abnormal   Collection Time    12/22/12  3:29 PM      Result Value Range   Pro B Natriuretic peptide (BNP) 20762.0 (*) 0 - 125 pg/mL  TSH     Status: None   Collection Time    12/22/12  7:15 PM      Result Value Range   TSH 1.702  0.350 - 4.500 uIU/mL   Comment: Performed at [facility]  HEMOGLOBIN A1C     Status: Abnormal   Collection Time    12/22/12  7:15 PM      Result Value Range   Hemoglobin A1C 5.9 (*) <5.7 %   Comment: (NOTE)  According to the ADA Clinical Practice Recommendations for 2011, when     HbA1c is used as a screening test:      >=6.5%   Diagnostic of Diabetes Mellitus               (if abnormal result is confirmed)     5.7-6.4%   Increased risk of developing Diabetes Mellitus     References:Diagnosis and Classification of Diabetes Mellitus,Diabetes     Care,2011,34(Suppl 1):S62-S69 and Standards of Medical Care in             Diabetes - 2011,Diabetes Care,2011,34 (Suppl 1):S11-S61.   Mean Plasma Glucose 123 (*) <117 mg/dL    Comment: Performed at [facility]  TROPONIN I     Status: None   Collection Time    12/22/12  7:15 PM      Result Value Range   Troponin I <0.30  <0.30 ng/mL   Comment:            Due to the release kinetics of cTnI,     a negative result within the first hours     of the onset of symptoms does not rule out     myocardial infarction with certainty.     If myocardial infarction is still suspected,     repeat the test at appropriate intervals.  TROPONIN I     Status: None   Collection Time    12/23/12 12:33 AM      Result Value Range   Troponin I <0.30  <0.30 ng/mL   Comment:            Due to the release kinetics of cTnI,     a negative result within the first hours     of the onset of symptoms does not rule out     myocardial infarction with certainty.     If myocardial infarction is still suspected,     repeat the test at appropriate intervals.  BASIC METABOLIC PANEL     Status: Abnormal   Collection Time    12/23/12  4:20 AM      Result Value Range   Sodium 135  135 - 145 mEq/L   Potassium 3.8  3.5 - 5.1 mEq/L   Chloride 95 (*) 96 - 112 mEq/L   CO2 25  19 - 32 mEq/L   Glucose, Bld 109 (*) 70 - 99 mg/dL   BUN 38 (*) 6 - 23 mg/dL   Creatinine, Ser 1.61 (*) 0.50 - 1.35 mg/dL   Calcium 7.4 (*) 8.4 - 10.5 mg/dL   GFR calc non Af Amer 6 (*) >90 mL/min   GFR calc Af Amer 7 (*) >90 mL/min   Comment:            The eGFR has been calculated     using the CKD EPI equation.     This calculation has not been     validated in all clinical     situations.     eGFR's persistently     <90 mL/min signify     possible Chronic Kidney Disease.  CBC     Status: Abnormal   Collection Time    12/23/12  4:20 AM      Result Value Range   WBC 5.1  4.0 - 10.5 K/uL   RBC 3.06 (*) 4.22 - 5.81 MIL/uL   Hemoglobin 9.6 (*) 13.0 - 17.0 g/dL   HCT 09.6 (*) 04.5 - 40.9 %  MCV 91.5  78.0 - 100.0 fL   MCH 31.4  26.0 - 34.0 pg   MCHC 34.3  30.0 - 36.0 g/dL   RDW 16.1  09.6 - 04.5 %    Platelets 81 (*) 150 - 400 K/uL   Comment: CONSISTENT WITH PREVIOUS RESULT  TROPONIN I     Status: None   Collection Time    12/23/12  9:03 AM      Result Value Range   Troponin I <0.30  <0.30 ng/mL   Comment:            Due to the release kinetics of cTnI,     a negative result within the first hours     of the onset of symptoms does not rule out     myocardial infarction with certainty.     If myocardial infarction is still suspected,     repeat the test at appropriate intervals.  MRSA PCR SCREENING     Status: None   Collection Time    12/23/12 10:01 AM      Result Value Range   MRSA by PCR NEGATIVE  NEGATIVE   Comment:            The GeneXpert MRSA Assay (FDA     approved for NASAL specimens     only), is one component of a     comprehensive MRSA colonization     surveillance program. It is not     intended to diagnose MRSA     infection nor to guide or     monitor treatment for     MRSA infections.  GLUCOSE, CAPILLARY     Status: Abnormal   Collection Time    12/23/12 11:11 AM      Result Value Range   Glucose-Capillary 113 (*) 70 - 99 mg/dL   Comment 1 Notify RN     Constitutional: positive for fatigue; negative for chills, fevers and sweats Ears, nose, mouth, throat, and face: positive for sore throat; negative for earaches, hoarseness and nasal congestion Respiratory: positive for cough, dyspnea on exertion and sputum, negative for hemoptysis Cardiovascular: positive for dyspnea, negative for chest pain, orthopnea and palpitations Gastrointestinal: negative for abdominal pain, change in bowel habits, nausea and vomiting Genitourinary:negative, anuric Musculoskeletal:negative for arthralgias, back pain, myalgias and neck pain Neurological: negative for dizziness, gait problems, headaches and paresthesia  Physical Exam: Filed Vitals:   12/23/12 0500  BP: 169/94  Pulse: 56  Temp: 98.3 F (36.8 C)  Resp: 14     General appearance: alert, cooperative and no  distress Head: Normocephalic, without obvious abnormality, atraumatic Neck: no adenopathy, no carotid bruit, no JVD and supple, symmetrical, trachea midline Resp: Expiratory wheezes bilaterally Cardio: regular rate and rhythm, S1, S2 normal, no murmur, click, rub or gallop GI: soft, non-tender; bowel sounds normal; no masses,  no organomegaly Extremities: extremities normal, atraumatic, no cyanosis or edema Neurologic: Grossly normal Dialysis Access: AVF @ LUA with + bruit   Dialysis Orders:   TTS @ [facility] 80.5 kg   3:45   500/800   2K/2Ca   Heparin 2400 U    AVF @ LUA       Hectorol 0    Epogen 2200 U    Venofer 50 mg qwk  Assessment/Plan: 1. Dyspnea - likely bronchitis, CXR with no overt edema; feeling better s/p nebulizer, on IV Levaquin. 2. ESRD - HD on TTS @ [place]; K 3.8.  HD tomorrow as scheduled. 3. Hypertension/volume  -  BP 169/94 on Amlodipine 10 mg qhs, Carvedilol 25 mg bid, Lisinopril 40 mg qd, Losartan 100 mg qd; current wt 82 with EDW 80.5; CXR negative for edema. 4. Anemia  - Hgb 9.6 on outpatient Epogen, last T-sat 38% with ferritin 829 (7/24), on Venofer qwk. 5. Metabolic bone disease - Ca 7.4, last P 3.9 & iPTH 45 (4/24); no Hectorol; Renvela 2 with meals.  6. Nutrition - last Alb 3.8, renal diet, vitamin. 7. DM Type 2 - diet-controlled. 8. Hepatitis C - Hx cirrhosis. 9. LUA AVF pseudoaneurysm plication - per Dr. Imogene Burn 6/25, buttonhole.  Jahiem Franzoni D 12/23/2012, 3:29 PM   Attending Nephrologist: Delano Metz, MD  Patient seen and examined.  Agree with assessment and plan as above.  ESRD patient active smoker admitted with SOB and wheezing, clear cxr, probable bronchitis with bronchospasm. No signs of fluid overload. Plan HD in am tomorrow, will follow.   [name]  MD Pager [phone]    Cell  [phone] 12/23/2012, 3:29 PM

## 2012-12-24 DIAGNOSIS — I517 Cardiomegaly: Secondary | ICD-10-CM

## 2012-12-24 LAB — RENAL FUNCTION PANEL
BUN: 56 mg/dL — ABNORMAL HIGH (ref 6–23)
Calcium: 7.1 mg/dL — ABNORMAL LOW (ref 8.4–10.5)
Creatinine, Ser: 11.76 mg/dL — ABNORMAL HIGH (ref 0.50–1.35)
Glucose, Bld: 96 mg/dL (ref 70–99)
Phosphorus: 5.3 mg/dL — ABNORMAL HIGH (ref 2.3–4.6)

## 2012-12-24 LAB — GLUCOSE, CAPILLARY
Glucose-Capillary: 108 mg/dL — ABNORMAL HIGH (ref 70–99)
Glucose-Capillary: 108 mg/dL — ABNORMAL HIGH (ref 70–99)
Glucose-Capillary: 80 mg/dL (ref 70–99)
Glucose-Capillary: 175 mg/dL — ABNORMAL HIGH (ref 70–99)

## 2012-12-24 LAB — CBC
HCT: 26.6 % — ABNORMAL LOW (ref 39.0–52.0)
Hemoglobin: 9.1 g/dL — ABNORMAL LOW (ref 13.0–17.0)
MCH: 31 pg (ref 26.0–34.0)
MCHC: 34.2 g/dL (ref 30.0–36.0)

## 2012-12-24 MED ORDER — SODIUM CHLORIDE 0.9 % IV SOLN: 62.5000 mg | INTRAVENOUS | Status: DC

## 2012-12-24 MED ORDER — GI COCKTAIL ~~LOC~~
30.0000 mL | Freq: Once | ORAL | Status: AC
  Administered 2012-12-24: 30 mL via ORAL
  Filled 2012-12-24: qty 30

## 2012-12-24 MED ORDER — PENTAFLUOROPROP-TETRAFLUOROETH EX AERO: 1.0000 "application " | INHALATION_SPRAY | CUTANEOUS | Status: DC | PRN

## 2012-12-24 MED ORDER — HEPARIN SODIUM (PORCINE) 1000 UNIT/ML DIALYSIS
2400.0000 [IU] | Freq: Once | INTRAMUSCULAR | Status: AC
  Administered 2012-12-24: 2400 [IU] via INTRAVENOUS_CENTRAL

## 2012-12-24 MED ORDER — IPRATROPIUM BROMIDE 0.02 % IN SOLN: 0.5000 mg | RESPIRATORY_TRACT | Status: DC | PRN

## 2012-12-24 MED ORDER — LIDOCAINE-PRILOCAINE 2.5-2.5 % EX CREA: 1.0000 "application " | TOPICAL_CREAM | CUTANEOUS | Status: DC | PRN

## 2012-12-24 MED ORDER — LIDOCAINE HCL (PF) 1 % IJ SOLN: 5.0000 mL | INTRAMUSCULAR | Status: DC | PRN

## 2012-12-24 MED ORDER — NEPRO/CARBSTEADY PO LIQD
237.0000 mL | ORAL | Status: DC | PRN
  Filled 2012-12-24: qty 237

## 2012-12-24 MED ORDER — SODIUM CHLORIDE 0.9 % IV SOLN: 100.0000 mL | INTRAVENOUS | Status: DC | PRN

## 2012-12-24 MED ORDER — DIPHENHYDRAMINE HCL 25 MG PO CAPS
50.0000 mg | ORAL_CAPSULE | Freq: Once | ORAL | Status: AC
  Administered 2012-12-24: 50 mg via ORAL

## 2012-12-24 MED ORDER — HEPARIN SODIUM (PORCINE) 1000 UNIT/ML DIALYSIS: 1000.0000 [IU] | INTRAMUSCULAR | Status: DC | PRN

## 2012-12-24 MED ORDER — ZOLPIDEM TARTRATE 5 MG PO TABS
5.0000 mg | ORAL_TABLET | Freq: Once | ORAL | Status: AC
  Administered 2012-12-24: 5 mg via ORAL
  Filled 2012-12-24: qty 1

## 2012-12-24 MED ORDER — ALBUTEROL SULFATE (5 MG/ML) 0.5% IN NEBU: 2.5000 mg | INHALATION_SOLUTION | RESPIRATORY_TRACT | Status: DC | PRN

## 2012-12-24 MED ORDER — ALTEPLASE 2 MG IJ SOLR: 2.0000 mg | Freq: Once | INTRAMUSCULAR | Status: DC | PRN

## 2012-12-24 MED ORDER — PREDNISONE 20 MG PO TABS
40.0000 mg | ORAL_TABLET | Freq: Every day | ORAL | Status: DC
  Administered 2012-12-24 – 2012-12-25 (×2): 40 mg via ORAL
  Filled 2012-12-24 (×3): qty 2

## 2012-12-24 NOTE — Progress Notes (Addendum)
Pt.A/Ox4 and is ambulatory without assistance. During the shift he had no c/o pain and no signs of distress. PRN Ambien was ordered for patient per patient request. Patient initially refused vital signs and blood sugars but became more cooperative during the shift and eventually agreed to have vitals and blood sugars done. At [phone] Hemodialysis RN came to get patient he was transferred by bed without oxygen and without IV pump.

## 2012-12-24 NOTE — Procedures (Signed)
I was present at this dialysis session. I have reviewed the session itself and made appropriate changes.   Jerome Moselle  MD Pager [phone]    Cell  [phone] 12/24/2012, 9:41 AM

## 2012-12-24 NOTE — Progress Notes (Signed)
[facility] PROGRESS NOTE  [name] WUJ:[id number] DOB: [birth year] DOA: 12/22/2012 PCP: [name], MD  Assessment/Plan: Shortness of breath  -Likely secondary to acute bronchitis.  -Follow up 2-D echocardiogram.   -Started on empiric levaquin for bronchitis (see below)  Acute bronchitis vs early HCAP  - Noted to have fever, sputum production and pleuritic chest pain on admit -started patient on Levaquin empirically - Remains afebrile with no leukocytosis.  - Will repeat cxr tomorrow for interval change - Continue albuterol nebs. Will add PO prednisone for wheezing ESRD  -Nephrology consulted for TTS dialysis.  Diabetes mellitus type 2  -Is not on any oral hypoglycemic agents, check hemoglobin A1c.  Hypertension  -Presently stable.  -Cont blood pressure medications including Coreg, amlodipine, losartan and lisinopril.  Hepatitis C  -No confirmation in the medical records, but is profile as a diagnosis.  -Has thrombocytopenia likely related to hepatitis C  Lung Nodule  - Incidental finding on CTA  - Recommendations for follow up CT in 3 months  Code Status: Full Family Communication: Pt in room (indicate person spoken with, relationship, and if by phone, the number) Disposition Plan: Pending  Consultants:  Nephrology  Antibiotics: Levaquin - 8/10>>>  HPI/Subjective: No events overnight  Objective: Filed Vitals:   12/24/12 0550 12/24/12 0655 12/24/12 0710 12/24/12 0730  BP: 147/77 155/73 151/75 158/75  Pulse: 53 54 53 53  Temp: 97.9 F (36.6 C) 97.8 F (36.6 C)    TempSrc: Oral Oral    Resp: 18     Height:      Weight: 83.235 kg (183 lb 8 oz)     SpO2: 99% 99%      Intake/Output Summary (Last 24 hours) at 12/24/12 0758 Last data filed at 12/23/12 2228  Gross per 24 hour  Intake    363 ml  Output      0 ml  Net    363 ml   Filed Weights   12/22/12 1849 12/23/12 0049 12/24/12 0550  Weight: 81.4 kg (179 lb 7.3 oz) 82 kg (180 lb  12.4 oz) 83.235 kg (183 lb 8 oz)    Exam:   General:  Awake, in nad  Cardiovascular: regular, s1, s2  Respiratory: normal resp effort, no wheezing  Abdomen: soft, nondistended  Musculoskeletal: perfused, no clubbing   Data Reviewed: Basic Metabolic Panel:  Recent Labs Lab 12/22/12 1529 12/23/12 0420  NA 135 135  K 3.8 3.8  CL 97 95*  CO2 26 25  GLUCOSE 91 109*  BUN 27* 38*  CREATININE 7.48* 9.04*  CALCIUM 7.9* 7.4*   Liver Function Tests: No results found for this basename: AST, ALT, ALKPHOS, BILITOT, PROT, ALBUMIN,  in the last 168 hours No results found for this basename: LIPASE, AMYLASE,  in the last 168 hours No results found for this basename: AMMONIA,  in the last 168 hours CBC:  Recent Labs Lab 12/22/12 1529 12/23/12 0420 12/24/12 0714  WBC 7.3 5.1 5.2  NEUTROABS 4.7  --   --   HGB 10.1* 9.6* 9.1*  HCT 29.7* 28.0* 26.6*  MCV 92.0 91.5 90.5  PLT 102* 81* 70*   Cardiac Enzymes:  Recent Labs Lab 12/22/12 1915 12/23/12 0033 12/23/12 0903  TROPONINI <0.30 <0.30 <0.30   BNP (last 3 results)  Recent Labs  12/22/12 1529  PROBNP 20762.0*   CBG:  Recent Labs Lab 12/23/12 1111 12/23/12 1601 12/23/12 2220 12/24/12 0609  GLUCAP 113* 114* 108* 108*    Recent Results (from the past  240 hour(s))  MRSA PCR SCREENING     Status: None   Collection Time    12/23/12 10:01 AM      Result Value Range Status   MRSA by PCR NEGATIVE  NEGATIVE Final   Comment:            The GeneXpert MRSA Assay (FDA     approved for NASAL specimens     only), is one component of a     comprehensive MRSA colonization     surveillance program. It is not     intended to diagnose MRSA     infection nor to guide or     monitor treatment for     MRSA infections.     Studies: Dg Chest 2 View  12/22/2012   *RADIOLOGY REPORT*  Clinical Data: Chest pain. Shortness of breath.  Diabetic hypertensive patient.  CHEST - 2 VIEW  Comparison: 11/06/2012 and 06/30/2006.   Findings: Prominence hilar structures greater right appears stable. Kinked stent left axillary artery region unchanged.  No infiltrate or pneumothorax.  Cardiomegaly  Mildly tortuous aorta.  IMPRESSION: Prominence hilar structures greater on the right appears stable.  No infiltrate or pneumothorax.  Cardiomegaly  Mildly tortuous aorta.   Original Report Authenticated By: Lacy Duverney, M.D.   Ct Angio Chest Pe W/cm &/or Wo Cm  12/23/2012   *RADIOLOGY REPORT*  Clinical Data: Shortness of breath.  CT ANGIOGRAPHY CHEST  Technique:  Multidetector CT imaging of the chest using the standard protocol during bolus administration of intravenous contrast. Multiplanar reconstructed images including MIPs were obtained and reviewed to evaluate the vascular anatomy.  Contrast: 75mL OMNIPAQUE IOHEXOL 350 MG/ML SOLN  Comparison: Chest c t 02/08/2010.  Findings:  Mediastinum: There are no filling defects within the pulmonary arterial tree to suggest underlying pulmonary embolism.  Partial anomalous pulmonary venous return is noted with the majority of the venous drainage from the left upper lobe extending up to the left innominate vein.  Associated with this, there is cardiomegaly with massive right heart dilatation.  There is a right to left bowing of the interatrial septum, and marked straightening of the interventricular septum, suggesting elevated right-sided heart pressures.  Some right ventricular hypertrophy is also noted. Dilatation of the pulmonic trunk (3.7 cm in diameter) as well as the pulmonary arteries bilaterally (2.9 cm on the right and 2.8 cm on the left), compatible with pulmonary arterial hypertension. No pathologically enlarged mediastinal or hilar lymph nodes. Esophagus is unremarkable in appearance.  Lungs/Pleura: There is a small amount of ground-glass attenuation in the right lower lobe, particularly posterolaterally where there is a 2.5 cm ground-glass attenuation nodule (image 80 of series 80) which is  new compared to prior examination.  No other definite suspicious-appearing pulmonary nodules or masses are identified. Mild centrilobular and paraseptal emphysema, most pronounced in the lung apices.  No pleural effusions.  Upper Abdomen: Unremarkable.  Musculoskeletal: There are no aggressive appearing lytic or blastic lesions noted in the visualized portions of the skeleton.  Old healed fracture of the lateral aspect of the right seventh rib incidentally noted.  IMPRESSION: 1.  No evidence of pulmonary embolism. 2.  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 3.  In addition, there is some ground-glass attenuation in the right lower lobe which is favored to be of infectious or inflammatory etiology.  However, one of these areas of ground-glass is somewhat nodular  in appearance measuring up to 2.5 x 2.5 cm (image 80 of series 8). The possibility of a neoplasm such as adenocarcinoma is not excluded.  Initial follow-up by chest CT without contrast is recommended in 3 months to confirm persistence. This recommendation follows the consensus statement: Recommendations for the Management of Subsolid Pulmonary Nodules Detected at CT:  A Statement from the Fleischner Society as published in Radiology 2013; 266:304-317. 4.  Mild centrilobular paraseptal emphysema most pronounced in the lung apices.   Original Report Authenticated By: Trudie Reed, M.D.    Scheduled Meds: . albuterol  2.5 mg Nebulization TID  . amLODipine  10 mg Oral QHS  . aspirin  325 mg Oral Daily  . carvedilol  25 mg Oral BID WC  . darbepoetin (ARANESP) injection - DIALYSIS  60 mcg Intravenous Q Tue-HD  . fentaNYL  50-100 mcg Intravenous Once  . guaiFENesin  600 mg Oral BID  . heparin  5,000 Units Subcutaneous Q8H  . insulin aspart  0-15 Units Subcutaneous TID WC  . insulin aspart  0-5 Units Subcutaneous QHS  .  ipratropium  0.5 mg Nebulization TID  . levofloxacin (LEVAQUIN) IV  500 mg Intravenous Q48H  . lisinopril  40 mg Oral Daily  . losartan  100 mg Oral QHS  . multivitamin  1 tablet Oral Daily  . sevelamer carbonate  1,600 mg Oral TID WC  . sodium chloride  3 mL Intravenous Q12H   Continuous Infusions:   Principal Problem:   SOB (shortness of breath) Active Problems:   HTN (hypertension)   DM type 2 (diabetes mellitus, type 2)   End stage renal disease   Acute bronchitis    Time spent:    Rexanne Inocencio K  [facility] Pager [phone]. If 7PM-7AM, please contact night-coverage at [email or web address], password [facility] 12/24/2012, 7:58 AM  LOS: 2 days

## 2012-12-24 NOTE — Clinical Documentation Improvement (Signed)
THIS DOCUMENT IS NOT A PERMANENT PART OF THE MEDICAL RECORD  Please update your documentation with the medical record to reflect your response to this query. If you need help knowing how to do this please call [phone].  12/24/12  Dear Dr. [facility],  In a better effort to capture your patient's severity of illness, reflect appropriate length of stay and utilization of resources, a review of the patient medical record has revealed the following indicators the diagnosis of Heart Failure.    Based on your clinical judgment, please clarify TYPE & ACUITY and document in a progress note and/or discharge summary the clinical condition associated with the following supporting information:  In responding to this query please exercise your independent judgment.  The fact that a query is asked, does not imply that any particular answer is desired or expected.  Possible Clinical Conditions?  Chronic Systolic Congestive Heart Failure Chronic Diastolic Congestive Heart Failure Chronic Systolic & Diastolic Congestive Heart Failure Acute Systolic Congestive Heart Failure Acute Diastolic Congestive Heart Failure Acute Systolic & Diastolic Congestive Heart Failure Acute on Chronic Systolic Congestive Heart Failure Acute on Chronic Diastolic Congestive Heart Failure Acute on Chronic Systolic & Diastolic  Congestive Heart Failure Other Condition Cannot Clinically Determine  Supporting Information:   Risk Factors: (As per notes) "PMH of MI, hypertension, diabetes mellitus, ESRD, GERD, CHF"  Signs & Symptoms: (As per ED notes) 1. CHF (congestive heart failure), acute on chronic, systolic    Diagnostics: Labs: 12/22/12 B NATRIURETIC PEPTIDE(pg/mL) 82956.2Z  Reviewed:  no additional documentation provided  NO RESPONSE  Thank You,  Nevin Bloodgood RN, BSN, CCDSS Clinical Documentation Specialist: [phone] Health Information Management [place]

## 2012-12-24 NOTE — Progress Notes (Signed)
[facility] Progress Note  Subjective:   Coughing up sputum  Objective Filed Vitals:   12/24/12 0730 12/24/12 0800 12/24/12 0830 12/24/12 0900  BP: 158/75 158/67 143/79 159/68  Pulse: 53 53 60 62  Temp:      TempSrc:      Resp:      Height:      Weight:      SpO2:       Physical Exam  Goal 4.5  General: NAD on HD Heart: RRR Lungs: diffuse wheezes but good air exchange Abdomen: soft NT Extremities: no LE edema Dialysis Access: left upper AVF Qb 400  Dialysis Orders: TTS @ [facility]  80.5 kg 3:45 500/800 (14 gauge) 2K/2Ca Heparin 2400 U AVF @ LUA  Hectorol 0 Epogen 2200 U Venofer 50 mg qwk   Assessment/Plan: 1. Acute bronchitis - CT agio neg for PE; RLL ground glass area with ? Nodule - rec f/u CT 3 months; on IV levaquin 2. ESRD - TTS running at 400 - yellow light - ^ to 450 (normally uses 14 gauge) 3. Anemia - Hgb 9.1 trending down; Aranesp 60 and weekly IV Fe 4. Secondary hyperparathyroidism - correct Ca 8 on 2 Ca bath prior to admission; not clear why Ca so low; not on hectorol; last iPTH 45 in April - placed on 2.5 Ca today 5. HTN/volume - BP elevated; EDW needs lowering - getting below as an outpt; on ACE, ARB, CCB and BB 6. Nutrition - change to  high protein renal diet   Sheffield Slider, PA-C [facility] Beeper [phone] 12/24/2012,9:25 AM  LOS: 2 days   Patient seen and examined.  Agree with assessment and plan as above. [name]  MD Pager [phone]    Cell  [phone] 12/24/2012, 10:44 AM    Additional Objective Labs: Basic Metabolic Panel:  Recent Labs Lab 12/22/12 1529 12/23/12 0420 12/24/12 0714  NA 135 135 134*  K 3.8 3.8 4.5  CL 97 95* 95*  CO2 26 25 22   GLUCOSE 91 109* 96  BUN 27* 38* 56*  CREATININE 7.48* 9.04* 11.76*  CALCIUM 7.9* 7.4* 7.1*  PHOS  --   --  5.3*   Liver Function Tests:  Recent Labs Lab 12/24/12 0714  ALBUMIN 2.9*   CBC:  Recent Labs Lab 12/22/12 1529 12/23/12 0420  12/24/12 0714  WBC 7.3 5.1 5.2  NEUTROABS 4.7  --   --   HGB 10.1* 9.6* 9.1*  HCT 29.7* 28.0* 26.6*  MCV 92.0 91.5 90.5  PLT 102* 81* 70*   Blood Culture    Component Value Date/Time   SDES BLOOD RIGHT ARM 11/03/2012 1226   SPECREQUEST BOTTLES DRAWN AEROBIC AND ANAEROBIC 10CC 11/03/2012 1226   CULT NO GROWTH 5 DAYS 11/03/2012 1226   REPTSTATUS 11/09/2012 FINAL 11/03/2012 1226    Cardiac Enzymes:  Recent Labs Lab 12/22/12 1915 12/23/12 0033 12/23/12 0903  TROPONINI <0.30 <0.30 <0.30   CBG:  Recent Labs Lab 12/23/12 1111 12/23/12 1601 12/23/12 2220 12/24/12 0609  GLUCAP 113* 114* 108* 108*   Studies/Results:D.   Ct Angio Chest Pe W/cm &/or Wo Cm  12/23/2012   *RADIOLOGY REPORT*  Clinical Data: Shortness of breath.  CT ANGIOGRAPHY CHEST  Technique:  Multidetector CT imaging of the chest using the standard protocol during bolus administration of intravenous contrast. Multiplanar reconstructed images including MIPs were obtained and reviewed to evaluate the vascular anatomy.  Contrast: 75mL OMNIPAQUE IOHEXOL 350 MG/ML SOLN  Comparison: Chest c t  02/08/2010.     IMPRESSION: 1.  No evidence of pulmonary embolism. 2.  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 3.  In addition, there is some ground-glass attenuation in the right lower lobe which is favored to be of infectious or inflammatory etiology.  However, one of these areas of ground-glass is somewhat nodular in appearance measuring up to 2.5 x 2.5 cm (image 80 of series 8). The possibility of a neoplasm such as adenocarcinoma is not excluded.  Initial follow-up by chest CT without contrast is recommended in 3 months to confirm persistence. This recommendation follows the consensus statement: Recommendations for the Management of Subsolid Pulmonary Nodules Detected at CT:  A Statement from the Fleischner  Society as published in Radiology 2013; 266:304-317. 4.  Mild centrilobular paraseptal emphysema most pronounced in the lung apices.   Original Report Authenticated By: Trudie Reed, M.D.   Medications:   . albuterol  2.5 mg Nebulization TID  . amLODipine  10 mg Oral QHS  . aspirin  325 mg Oral Daily  . carvedilol  25 mg Oral BID WC  . darbepoetin (ARANESP) injection - DIALYSIS  60 mcg Intravenous Q Tue-HD  . fentaNYL  50-100 mcg Intravenous Once  . guaiFENesin  600 mg Oral BID  . heparin  5,000 Units Subcutaneous Q8H  . insulin aspart  0-15 Units Subcutaneous TID WC  . insulin aspart  0-5 Units Subcutaneous QHS  . ipratropium  0.5 mg Nebulization TID  . levofloxacin (LEVAQUIN) IV  500 mg Intravenous Q48H  . lisinopril  40 mg Oral Daily  . losartan  100 mg Oral QHS  . multivitamin  1 tablet Oral Daily  . predniSONE  40 mg Oral Q breakfast  . sevelamer carbonate  1,600 mg Oral TID WC  . sodium chloride  3 mL Intravenous Q12H

## 2012-12-24 NOTE — Progress Notes (Signed)
  Echocardiogram 2D Echocardiogram has been performed.  [name] 12/24/2012, 3:54 PM

## 2012-12-25 ENCOUNTER — Inpatient Hospital Stay (HOSPITAL_COMMUNITY): Payer: Medicare Other

## 2012-12-25 DIAGNOSIS — I10 Essential (primary) hypertension: Secondary | ICD-10-CM

## 2012-12-25 DIAGNOSIS — I519 Heart disease, unspecified: Secondary | ICD-10-CM | POA: Diagnosis present

## 2012-12-25 DIAGNOSIS — I509 Heart failure, unspecified: Secondary | ICD-10-CM

## 2012-12-25 DIAGNOSIS — D649 Anemia, unspecified: Secondary | ICD-10-CM

## 2012-12-25 DIAGNOSIS — I5023 Acute on chronic systolic (congestive) heart failure: Secondary | ICD-10-CM

## 2012-12-25 DIAGNOSIS — E119 Type 2 diabetes mellitus without complications: Secondary | ICD-10-CM

## 2012-12-25 DIAGNOSIS — J209 Acute bronchitis, unspecified: Principal | ICD-10-CM

## 2012-12-25 LAB — GLUCOSE, CAPILLARY

## 2012-12-25 MED ORDER — GUAIFENESIN ER 600 MG PO TB12: 600.0000 mg | ORAL_TABLET | Freq: Two times a day (BID) | ORAL | Status: DC

## 2012-12-25 MED ORDER — LEVOFLOXACIN 500 MG PO TABS: 500.0000 mg | ORAL_TABLET | ORAL | Status: DC

## 2012-12-25 NOTE — Progress Notes (Signed)
Utilization Review Completed.   Dylon Correa, RN, BSN Nurse Case Manager  [phone]  

## 2012-12-25 NOTE — Progress Notes (Signed)
Pt.A/Ox4 and ambulates independently. Pt.had no c/o pain or signs of distress. He had an c/o heartburn and was also requesting something for sleep so Mid level provider on call with [facility] was paged and notified. PRN medications were given.

## 2012-12-25 NOTE — Discharge Summary (Signed)
HOSPITAL DISCHARGE SUMMARY   @n   [name], 57 y.o., DOB 1956/05/08  Admission date: 12/22/2012 Discharge Date: 12/25/2012  Primary MD [name], MD  Admitting Physician Clydia Llano, MD  Admission Diagnosis  CHF (congestive heart failure), acute on chronic, systolic [428.23, 428.0]  Recommendations for Outpatient Follow Up: 1) Pt had an incidental nodule on CT angio: Recommend repeat CT chest in 3 months to evaluate nodule 2) assist patient with smoking cessation 3) Discuss diabetes treatment options - pt has history of hypoglycemia with oral hypogycemic agents  Discharge Diagnoses:   Active Hospital Problems   Diagnosis Date Noted  . SOB (shortness of breath) 12/22/2012  . Left ventricular diastolic dysfunction, NYHA class 2 12/25/2012  . Acute bronchitis 12/22/2012  . End stage renal disease 11/22/2012  . DM type 2 (diabetes mellitus, type 2) 02/03/2012  . HTN (hypertension) 02/03/2012    Resolved Hospital Problems   Diagnosis Date Noted Date Resolved  No resolved problems to display.    Past Medical History  Diagnosis Date  . Myocardial infarction   . Hypertension   . Diabetes mellitus   . 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  Started HD in 2007, gets HD at [facility] on TTS schedule.    Marland Kitchen GERD (gastroesophageal reflux disease)   . Pulmonary edema     Just prior to starting HD and resolved with HD. ECHO in 2008 showed normal LVEF, mild diiastolic dysfunction.   . Neuropathy     Hx: of in toes  . Hepatitis     Hc: of Hep C  . Cataracts, both eyes     Hx: of "slightly"  . Pancreatitis     Hx: of    Past Surgical History  Procedure Laterality Date  . Dialysis fistula creation Left   . Cholecystectomy    . Colonoscopy      Hx: of  . Ligation of arteriovenous  fistula Left 11/06/2012    Procedure: PLICATION OF BRACHIO-CEPHALIC ARTERIOVENOUS  FISTULA;  Surgeon: Fransisco Hertz, MD;  Location: [facility] OR;  Service:  Vascular;  Laterality: Left;    Consults  Nephrology Dr. Arlean Hopping  Significant Tests:  See full reports for all details   Echocardiogram  Hospital Course See H&P, Labs, Consult and Test reports for all details. In brief, this patient was admitted for   Shortness of breath  -Likely secondary to acute bronchitis.  -2-D echocardiogram reveals diastolic dysfunction (stage 2), EF 60%.  -Started on empiric levaquin for bronchitis and symptoms rapidly improved   Acute bronchitis vs early HCAP  - Noted to have fever, sputum production and pleuritic chest pain on admit but resolved at discharge  - started patient on Levaquin empirically  - Remained afebrile with no leukocytosis.   ESRD  -Nephrology consulted for TTS dialysis.   Diabetes mellitus type 2  -Is not on any oral hypoglycemic agents, hemoglobin A1c is 5.9%.   Hypertension  -Presently stable.  -Cont blood pressure medications including Coreg, amlodipine, losartan and lisinopril.   Hepatitis C  -No confirmation in the medical records, but is profile as a diagnosis.  -Has thrombocytopenia likely related to hepatitis C  -Pt to follow up with PCP to further discuss ([facility]ang[facility])  Lung Nodule  - Incidental finding on CTA  - Recommendations for follow up CT in 3 months   Active Hospital Problems   Diagnosis Date Noted  . SOB (shortness of  breath) 12/22/2012  . Left ventricular diastolic dysfunction, NYHA class 2 12/25/2012  . Acute bronchitis 12/22/2012  . End stage renal disease 11/22/2012  . DM type 2 (diabetes mellitus, type 2) 02/03/2012  . HTN (hypertension) 02/03/2012    Resolved Hospital Problems   Diagnosis Date Noted Date Resolved  No resolved problems to display.     Today's Assessment:   Subjective:   [name]  Pt says he feels much better and is asking to go home, no SOB and no chest pain  Objective:   Blood pressure 144/67, pulse 61, temperature 98.1 F  (36.7 C), temperature source Oral, resp. rate 18, height 5\' 10"  (1.778 m), weight 81.5 kg (179 lb 10.8 oz), SpO2 100.00%.  Intake/Output Summary (Last 24 hours) at 12/25/12 0752 Last data filed at 12/24/12 2203  Gross per 24 hour  Intake    963 ml  Output   4000 ml  Net  -3037 ml    Exam General - awake, alert, no distress HEENT - NCAT, MMM Lungs - BBS clear, no wheezing CV - normal s1, s2 sounds Abd - soft, nondistended, nontender, no masses Ext - no clubbing or cyanosis   Lab Results  Component Value Date   WBC 5.2 12/24/2012   HGB 9.1* 12/24/2012   HCT 26.6* 12/24/2012   PLT 70* 12/24/2012   LYMPHOPCT 13 12/22/2012   BANDSPCT 0 02/03/2012   MONOPCT 10 12/22/2012   EOSPCT 12* 12/22/2012   BASOPCT 1 12/22/2012   CMP: Lab Results  Component Value Date   NA 134* 12/24/2012   K 4.5 12/24/2012   CL 95* 12/24/2012   CO2 22 12/24/2012   BUN 56* 12/24/2012   CREATININE 11.76* 12/24/2012   PROT 8.9* 08/02/2012   ALBUMIN 2.9* 12/24/2012   BILITOT 0.2* 08/02/2012   ALKPHOS 61 08/02/2012   AST 29 08/02/2012   ALT 30 08/02/2012  .  DISCHARGE MEDICATION:   Medication List         amLODipine 10 MG tablet  Commonly known as:  NORVASC  Take 10 mg by mouth at bedtime.     aspirin 325 MG EC tablet  Take 325 mg by mouth daily.     b complex-vitamin c-folic acid 0.8 MG Tabs tablet  Take 0.8 mg by mouth at bedtime.     carvedilol 25 MG tablet  Commonly known as:  COREG  Take 25 mg by mouth 2 (two) times daily with a meal.     guaiFENesin 600 MG 12 hr tablet  Commonly known as:  MUCINEX  Take 1 tablet (600 mg total) by mouth 2 (two) times daily.     levofloxacin 500 MG tablet  Commonly known as:  LEVAQUIN  Take 1 tablet (500 mg total) by mouth every other day.  Start taking on:  12/26/2012     lisinopril 40 MG tablet  Commonly known as:  PRINIVIL,ZESTRIL  Take 40 mg by mouth daily.     losartan 100 MG tablet  Commonly known as:  COZAAR  Take 100 mg by mouth at bedtime.      sevelamer carbonate 800 MG tablet  Commonly known as:  RENVELA  Take 2 tablets (1,600 mg total) by mouth 3 (three) times daily with meals.       Disposition and Follow-up:      Discharge Orders   Future Orders Complete By Expires   (HEART FAILURE PATIENTS) Call MD:  Anytime you have any of the following symptoms: 1) 3 pound weight  gain in 24 hours or 5 pounds in 1 week 2) shortness of breath, with or without a dry hacking cough 3) swelling in the hands, feet or stomach 4) if you have to sleep on extra pillows at night in order to breathe.  As directed    Call MD for:  difficulty breathing, headache or visual disturbances  As directed    Call MD for:  extreme fatigue  As directed    Call MD for:  persistant dizziness or light-headedness  As directed    Call MD for:  severe uncontrolled pain  As directed    Discharge instructions  As directed    Comments:     Please stop smoking cigarettes  Please follow up with dialysis on tomorrow as scheduled Please [facility] in [facility] week   Increase activity slowly  As directed      Follow-up Information   Follow up with GOLDSBOROUGH,KELLIE A, MD In 1 day. (resume Dialysis tomorrow)    Specialty:  Nephrology   Contact information:   [address] [place] [address] [phone]       Follow up with  [facility]    . Schedule an appointment as soon as possible for a visit in 1 week. (establish primary care )    Contact information:   [address] [place] [address]      The risks, benefits, and possible side effects of all treatments and tests were explained to the patient.  The patient verbalized understanding.  The importance of close follow up with the primary care medical provider was explained clearly to the patient.  The patient verbalized understanding.  The patient was given instructions to return if symptoms recur, worsen or new changes develop.  The patient  verbalized understanding.   Cleora Fleet, MD, CDE, FAAFP [facility] [place], [place]   Total Time spent reviewing critical document, reviewing this patient's comprehensive hospitalization, arranging follow up and coordination of care, reviewing data and todays exam greater than 35 minutes.   Signed: Asianae Minkler Laural Benes 12/25/2012 7:52 AM

## 2012-12-25 NOTE — Progress Notes (Signed)
Patient alert and oriented x4. Patient given discharge instructions and verbalizes understanding.  Denies any questions or concerns.  Pt given HF handbook and instructed on importance and use.  Attempted to schedule PCP appt, left message for clinic to call patient back to set up appt.  IV access removed and cannula intact and telemetry removed.

## 2013-01-02 NOTE — Progress Notes (Signed)
CHF (congestive heart failure), acute on chronic, systolic

## 2013-01-08 ENCOUNTER — Ambulatory Visit: Payer: Medicare Other | Attending: Internal Medicine | Admitting: Internal Medicine

## 2013-01-08 ENCOUNTER — Encounter: Payer: Self-pay | Admitting: Internal Medicine

## 2013-01-08 VITALS — BP 161/76 | HR 60 | Temp 98.2°F | Resp 16 | Ht 70.0 in | Wt 183.0 lb

## 2013-01-08 DIAGNOSIS — M79609 Pain in unspecified limb: Secondary | ICD-10-CM

## 2013-01-08 DIAGNOSIS — M79602 Pain in left arm: Secondary | ICD-10-CM

## 2013-01-08 DIAGNOSIS — I120 Hypertensive chronic kidney disease with stage 5 chronic kidney disease or end stage renal disease: Secondary | ICD-10-CM | POA: Insufficient documentation

## 2013-01-08 DIAGNOSIS — N186 End stage renal disease: Secondary | ICD-10-CM | POA: Insufficient documentation

## 2013-01-08 MED ORDER — TRAMADOL HCL 50 MG PO TABS: 50.0000 mg | ORAL_TABLET | Freq: Three times a day (TID) | ORAL | Status: DC | PRN

## 2013-01-08 NOTE — Progress Notes (Signed)
PT HERE TO ESTABLISH CARE HX ESRD-T THURS SAT L ARM FISTULA

## 2013-01-08 NOTE — Progress Notes (Signed)
Patient ID: [name], male   DOB: 05-08-1956, 58 y.o.   MRN: [id number]  CC:  HPI: 57 year old male with a history of end-stage renal disease on dialysis on Tuesday Thursday Saturday via AV fistula with recent declotting of his AV fistula by Dr. Imogene Burn, presents to establish care. Patient was recently hospitalized on 8/13 for shortness of breath and was treated for acute bronchitis He had a CT chest that showed is incidental nodule. His last hemoglobin A1c was 5.6. He states that in the past his oral hypoglycemics' become hypoglycemic and he was taken off of them. Blood pressure is elevated he states that he is compliant with his dialysis he denies any shortness of breath or dependent edema   No Known Allergies Past Medical History  Diagnosis Date  . Myocardial infarction   . Hypertension   . Diabetes mellitus   . 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  Started HD in 2007, gets HD at [facility] on TTS schedule.    Marland Kitchen GERD (gastroesophageal reflux disease)   . Pulmonary edema     Just prior to starting HD and resolved with HD. ECHO in 2008 showed normal LVEF, mild diiastolic dysfunction.   . Neuropathy     Hx: of in toes  . Hepatitis     Hc: of Hep C  . Cataracts, both eyes     Hx: of "slightly"  . Pancreatitis     Hx: of   Current Outpatient Prescriptions on File Prior to Visit  Medication Sig Dispense Refill  . amLODipine (NORVASC) 10 MG tablet Take 10 mg by mouth at bedtime.      Marland Kitchen aspirin 325 MG EC tablet Take 325 mg by mouth daily.      . carvedilol (COREG) 25 MG tablet Take 25 mg by mouth 2 (two) times daily with a meal.      . guaiFENesin (MUCINEX) 600 MG 12 hr tablet Take 1 tablet (600 mg total) by mouth 2 (two) times daily.  20 tablet  0  . lisinopril (PRINIVIL,ZESTRIL) 40 MG tablet Take 40 mg by mouth daily.      Marland Kitchen losartan (COZAAR) 100 MG tablet Take 100 mg by mouth at bedtime.      . sevelamer (RENVELA) 800 MG tablet Take 2 tablets  (1,600 mg total) by mouth 3 (three) times daily with meals.  60 tablet  0  . b complex-vitamin c-folic acid (NEPHRO-VITE) 0.8 MG TABS Take 0.8 mg by mouth at bedtime.      Marland Kitchen levofloxacin (LEVAQUIN) 500 MG tablet Take 1 tablet (500 mg total) by mouth every other day.  2 tablet  0   No current facility-administered medications on file prior to visit.   Family History  Problem Relation Age of Onset  . Diabetes Mother    History   Social History  . Marital Status: Divorced    Spouse Name: N/A    Number of Children: N/A  . Years of Education: N/A   Occupational History  . Not on file.   Social History Main Topics  . Smoking status: Former Smoker -- 0.50 packs/day for 43 years    Types: Cigarettes    Quit date: 12/16/2012  . Smokeless tobacco: Never Used  . Alcohol Use: No  . Drug Use: No  . Sexual Activity: Not Currently   Other Topics Concern  . Not on file   Social History Narrative  . No narrative on  file    Review of Systems  Constitutional: Negative for fever, chills, diaphoresis, activity change, appetite change and fatigue.  HENT: Negative for ear pain, nosebleeds, congestion, facial swelling, rhinorrhea, neck pain, neck stiffness and ear discharge.   Eyes: Negative for pain, discharge, redness, itching and visual disturbance.  Respiratory: Negative for cough, choking, chest tightness, shortness of breath, wheezing and stridor.   Cardiovascular: Negative for chest pain, palpitations and leg swelling.  Gastrointestinal: Negative for abdominal distention.  Genitourinary: Negative for dysuria, urgency, frequency, hematuria, flank pain, decreased urine volume, difficulty urinating and dyspareunia.  Musculoskeletal: Negative for back pain, joint swelling, arthralgias and gait problem.  Neurological: Negative for dizziness, tremors, seizures, syncope, facial asymmetry, speech difficulty, weakness, light-headedness, numbness and headaches.  Hematological: Negative for  adenopathy. Does not bruise/bleed easily.  Psychiatric/Behavioral: Negative for hallucinations, behavioral problems, confusion, dysphoric mood, decreased concentration and agitation.    Objective:   Filed Vitals:   01/08/13 1555  BP: 161/76  Pulse: 60  Temp: 98.2 F (36.8 C)  Resp: 16    Physical Exam  Constitutional: Appears well-developed and well-nourished. No distress.  HENT: Normocephalic. External right and left ear normal. Oropharynx is clear and moist.  Eyes: Conjunctivae and EOM are normal. PERRLA, no scleral icterus.  Neck: Normal ROM. Neck supple. No JVD. No tracheal deviation. No thyromegaly.  CVS: RRR, S1/S2 +, no murmurs, no gallops, no carotid bruit.  Pulmonary: Effort and breath sounds normal, no stridor, rhonchi, wheezes, rales.  Abdominal: Soft. BS +,  no distension, tenderness, rebound or guarding.  Musculoskeletal: Normal range of motion. No edema and no tenderness.  Lymphadenopathy: No lymphadenopathy noted, cervical, inguinal. Neuro: Alert. Normal reflexes, muscle tone coordination. No cranial nerve deficit. Skin: Skin is warm and dry. No rash noted. Not diaphoretic. No erythema. No pallor.  Psychiatric: Normal mood and affect. Behavior, judgment, thought content normal.   Lab Results  Component Value Date   WBC 5.2 12/24/2012   HGB 9.1* 12/24/2012   HCT 26.6* 12/24/2012   MCV 90.5 12/24/2012   PLT 70* 12/24/2012   Lab Results  Component Value Date   CREATININE 11.76* 12/24/2012   BUN 56* 12/24/2012   NA 134* 12/24/2012   K 4.5 12/24/2012   CL 95* 12/24/2012   CO2 22 12/24/2012    Lab Results  Component Value Date   HGBA1C 5.9* 12/22/2012   Lipid Panel     Component Value Date/Time   CHOL 86 08/04/2012 0435   TRIG 89 08/04/2012 0435   HDL 40 08/04/2012 0435   CHOLHDL 2.2 08/04/2012 0435   VLDL 18 08/04/2012 0435   LDLCALC 28 08/04/2012 0435       Assessment and plan:   Patient Active Problem List   Diagnosis Date Noted  . Left ventricular  diastolic dysfunction, NYHA class 2 12/25/2012  . SOB (shortness of breath) 12/22/2012  . Acute bronchitis 12/22/2012  . End stage renal disease 11/22/2012  . Anemia 08/03/2012  . C. difficile enteritis 02/06/2012  . Pancreatitis, acute 02/03/2012  . SBO (small bowel obstruction) 02/03/2012  . HTN (hypertension) 02/03/2012  . 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 Kathrene Bongo, pain clinic referral provided  Followup in one month         The patient was given clear instructions to go to ER or return to medical center if symptoms don't improve, worsen or new problems develop. The patient verbalized understanding. The patient was told to call to get any lab results if not heard anything in the next week.

## 2013-10-20 ENCOUNTER — Emergency Department (HOSPITAL_COMMUNITY)
Admission: EM | Admit: 2013-10-20 | Discharge: 2013-10-20 | Disposition: A | Discharge status: Home / Self Care | Payer: Medicare Other | Attending: Emergency Medicine | Admitting: Emergency Medicine

## 2013-10-20 ENCOUNTER — Encounter (HOSPITAL_COMMUNITY): Payer: Self-pay | Admitting: Emergency Medicine

## 2013-10-20 ENCOUNTER — Emergency Department (HOSPITAL_COMMUNITY): Payer: Medicare Other

## 2013-10-20 DIAGNOSIS — Z8619 Personal history of other infectious and parasitic diseases: Secondary | ICD-10-CM | POA: Insufficient documentation

## 2013-10-20 DIAGNOSIS — Z79899 Other long term (current) drug therapy: Secondary | ICD-10-CM | POA: Insufficient documentation

## 2013-10-20 DIAGNOSIS — J209 Acute bronchitis, unspecified: Secondary | ICD-10-CM | POA: Insufficient documentation

## 2013-10-20 DIAGNOSIS — I120 Hypertensive chronic kidney disease with stage 5 chronic kidney disease or end stage renal disease: Secondary | ICD-10-CM | POA: Insufficient documentation

## 2013-10-20 DIAGNOSIS — J40 Bronchitis, not specified as acute or chronic: Secondary | ICD-10-CM

## 2013-10-20 DIAGNOSIS — H269 Unspecified cataract: Secondary | ICD-10-CM | POA: Insufficient documentation

## 2013-10-20 DIAGNOSIS — Z992 Dependence on renal dialysis: Secondary | ICD-10-CM | POA: Insufficient documentation

## 2013-10-20 DIAGNOSIS — E119 Type 2 diabetes mellitus without complications: Secondary | ICD-10-CM | POA: Insufficient documentation

## 2013-10-20 DIAGNOSIS — R05 Cough: Secondary | ICD-10-CM | POA: Insufficient documentation

## 2013-10-20 DIAGNOSIS — I252 Old myocardial infarction: Secondary | ICD-10-CM | POA: Insufficient documentation

## 2013-10-20 DIAGNOSIS — IMO0002 Reserved for concepts with insufficient information to code with codable children: Secondary | ICD-10-CM | POA: Insufficient documentation

## 2013-10-20 DIAGNOSIS — R059 Cough, unspecified: Secondary | ICD-10-CM | POA: Insufficient documentation

## 2013-10-20 DIAGNOSIS — N186 End stage renal disease: Secondary | ICD-10-CM | POA: Insufficient documentation

## 2013-10-20 DIAGNOSIS — Z8719 Personal history of other diseases of the digestive system: Secondary | ICD-10-CM | POA: Insufficient documentation

## 2013-10-20 LAB — CBC
HCT: 29.3 % — ABNORMAL LOW (ref 39.0–52.0)
Hemoglobin: 9.3 g/dL — ABNORMAL LOW (ref 13.0–17.0)
MCH: 28.8 pg (ref 26.0–34.0)
MCHC: 31.7 g/dL (ref 30.0–36.0)
MCV: 90.7 fL (ref 78.0–100.0)
Platelets: 61 10*3/uL — ABNORMAL LOW (ref 150–400)
RBC: 3.23 MIL/uL — ABNORMAL LOW (ref 4.22–5.81)
RDW: 16.2 % — ABNORMAL HIGH (ref 11.5–15.5)
WBC: 5.6 10*3/uL (ref 4.0–10.5)

## 2013-10-20 LAB — BASIC METABOLIC PANEL
BUN: 35 mg/dL — AB (ref 6–23)
CO2: 28 mEq/L (ref 19–32)
Calcium: 10 mg/dL (ref 8.4–10.5)
Chloride: 92 mEq/L — ABNORMAL LOW (ref 96–112)
Creatinine, Ser: 8.31 mg/dL — ABNORMAL HIGH (ref 0.50–1.35)
GFR calc Af Amer: 7 mL/min — ABNORMAL LOW (ref >90)
GFR, EST NON AFRICAN AMERICAN: 6 mL/min — AB (ref >90)
Glucose, Bld: 103 mg/dL — ABNORMAL HIGH (ref 70–99)
POTASSIUM: 4.3 meq/L (ref 3.7–5.3)
Sodium: 135 mEq/L — ABNORMAL LOW (ref 137–147)

## 2013-10-20 MED ORDER — DOXYCYCLINE HYCLATE 100 MG PO CAPS: 100.0000 mg | ORAL_CAPSULE | Freq: Two times a day (BID) | ORAL | Status: DC

## 2013-10-20 MED ORDER — ALBUTEROL SULFATE HFA 108 (90 BASE) MCG/ACT IN AERS
2.0000 | INHALATION_SPRAY | Freq: Once | RESPIRATORY_TRACT | Status: AC
  Administered 2013-10-20: 2 via RESPIRATORY_TRACT
  Filled 2013-10-20: qty 6.7

## 2013-10-20 MED ORDER — IPRATROPIUM-ALBUTEROL 0.5-2.5 (3) MG/3ML IN SOLN
3.0000 mL | Freq: Once | RESPIRATORY_TRACT | Status: AC
  Administered 2013-10-20: 3 mL via RESPIRATORY_TRACT
  Filled 2013-10-20: qty 3

## 2013-10-20 MED ORDER — PREDNISONE 20 MG PO TABS: 40.0000 mg | ORAL_TABLET | Freq: Every day | ORAL | Status: DC

## 2013-10-20 MED ORDER — BENZONATATE 100 MG PO CAPS: 200.0000 mg | ORAL_CAPSULE | Freq: Two times a day (BID) | ORAL | Status: DC | PRN

## 2013-10-20 MED ORDER — DOXYCYCLINE HYCLATE 100 MG PO TABS
100.0000 mg | ORAL_TABLET | Freq: Once | ORAL | Status: AC
  Administered 2013-10-20: 100 mg via ORAL
  Filled 2013-10-20: qty 1

## 2013-10-20 NOTE — ED Provider Notes (Signed)
Medical screening examination/treatment/procedure(s) were performed by non-physician practitioner and as supervising physician I was immediately available for consultation/collaboration.   EKG Interpretation None        William Janae Bonser, MD 10/20/13 2338 

## 2013-10-20 NOTE — ED Notes (Signed)
Patient is ambulating in hall with [place], CNA.

## 2013-10-20 NOTE — Discharge Instructions (Signed)
Take doxycycline as prescribed. You have been given your first dose in the ED. Take prednisone as prescribed. You may use an albuterol inhaler, 2 puffs every 4 hours, as needed for shortness of breath. Follow up with your primary care doctor in 24-48 hours.  Bronchitis Bronchitis is inflammation of the airways that extend from the windpipe into the lungs (bronchi). The inflammation often causes mucus to develop, which leads to a cough. If the inflammation becomes severe, it may cause shortness of breath. CAUSES  Bronchitis may be caused by:   Viral infections.   Bacteria.   Cigarette smoke.   Allergens, pollutants, and other irritants.  SIGNS AND SYMPTOMS  The most common symptom of bronchitis is a frequent cough that produces mucus. Other symptoms include:  Fever.   Body aches.   Chest congestion.   Chills.   Shortness of breath.   Sore throat.  DIAGNOSIS  Bronchitis is usually diagnosed through a medical history and physical exam. Tests, such as chest X-rays, are sometimes done to rule out other conditions.  TREATMENT  You may need to avoid contact with whatever caused the problem (smoking, for example). Medicines are sometimes needed. These may include:  Antibiotics. These may be prescribed if the condition is caused by bacteria.  Cough suppressants. These may be prescribed for relief of cough symptoms.   Inhaled medicines. These may be prescribed to help open your airways and make it easier for you to breathe.   Steroid medicines. These may be prescribed for those with recurrent (chronic) bronchitis. HOME CARE INSTRUCTIONS  Get plenty of rest.   Drink enough fluids to keep your urine clear or pale yellow (unless you have a medical condition that requires fluid restriction). Increasing fluids may help thin your secretions and will prevent dehydration.   Only take over-the-counter or prescription medicines as directed by your health care provider.  Only  take antibiotics as directed. Make sure you finish them even if you start to feel better.  Avoid secondhand smoke, irritating chemicals, and strong fumes. These will make bronchitis worse. If you are a smoker, quit smoking. Consider using nicotine gum or skin patches to help control withdrawal symptoms. Quitting smoking will help your lungs heal faster.   Put a cool-mist humidifier in your bedroom at night to moisten the air. This may help loosen mucus. Change the water in the humidifier daily. You can also run the hot water in your shower and sit in the bathroom with the door closed for 5 10 minutes.   Follow up with your health care provider as directed.   Wash your hands frequently to avoid catching bronchitis again or spreading an infection to others.  SEEK MEDICAL CARE IF: Your symptoms do not improve after 1 week of treatment.  SEEK IMMEDIATE MEDICAL CARE IF:  Your fever increases.  You have chills.   You have chest pain.   You have worsening shortness of breath.   You have bloody sputum.  You faint.  You have lightheadedness.  You have a severe headache.   You vomit repeatedly. MAKE SURE YOU:   Understand these instructions.  Will watch your condition.  Will get help right away if you are not doing well or get worse. Document Released: 05/01/2005 Document Revised: 02/19/2013 Document Reviewed: 12/24/2012 [facility] Patient Information 2014 [place], [place].  Cough, Adult  A cough is a reflex that helps clear your throat and airways. It can help heal the body or may be a reaction to an irritated airway.  A cough may only last 2 or 3 weeks (acute) or may last more than 8 weeks (chronic).  CAUSES Acute cough:  Viral or bacterial infections. Chronic cough:  Infections.  Allergies.  Asthma.  Post-nasal drip.  Smoking.  Heartburn or acid reflux.  Some medicines.  Chronic lung problems (COPD).  Cancer. SYMPTOMS   Cough.  Fever.  Chest  pain.  Increased breathing rate.  High-pitched whistling sound when breathing (wheezing).  Colored mucus that you cough up (sputum). TREATMENT   A bacterial cough may be treated with antibiotic medicine.  A viral cough must run its course and will not respond to antibiotics.  Your caregiver may recommend other treatments if you have a chronic cough. HOME CARE INSTRUCTIONS   Only take over-the-counter or prescription medicines for pain, discomfort, or fever as directed by your caregiver. Use cough suppressants only as directed by your caregiver.  Use a cold steam vaporizer or humidifier in your bedroom or home to help loosen secretions.  Sleep in a semi-upright position if your cough is worse at night.  Rest as needed.  Stop smoking if you smoke. SEEK IMMEDIATE MEDICAL CARE IF:   You have pus in your sputum.  Your cough starts to worsen.  You cannot control your cough with suppressants and are losing sleep.  You begin coughing up blood.  You have difficulty breathing.  You develop pain which is getting worse or is uncontrolled with medicine.  You have a fever. MAKE SURE YOU:   Understand these instructions.  Will watch your condition.  Will get help right away if you are not doing well or get worse. Document Released: 10/28/2010 Document Revised: 07/24/2011 Document Reviewed: 10/28/2010 [facility] Patient Information 2014 [place], [place].

## 2013-10-20 NOTE — ED Notes (Addendum)
Oxygen level 99% while ambulating. No complaints.

## 2013-10-20 NOTE — ED Notes (Signed)
Reviewed plan of care with Tresa Endo, PA-C, and reported wide QRS on 5 lead.  No new orders at this time.  Will prepare patient for discharge if no problems with ambulation.

## 2013-10-20 NOTE — ED Notes (Signed)
Patient arrives with complaint of productive cough for about a week. Explains that it started about a week ago, and he has been coughing up green tinted sputum. Possible fever at home, but could not measure.

## 2013-10-20 NOTE — ED Provider Notes (Signed)
CSN: [id number]     Arrival date & time 10/20/13  1918 History   First MD Initiated Contact with Patient 10/20/13 2214     Chief Complaint  Patient presents with  . Cough  . Shortness of Breath    (Consider location/radiation/quality/duration/timing/severity/associated sxs/prior Treatment) HPI Comments: Patient is a 58 year old male with a history of ACS, hypertension, diabetes mellitus, and end-stage renal disease on hemodialysis (last dialyzed today) who presents to the ED for cough x 1 week. Patient states the cough has been productive of green tinted sputum. He states he took Mucinex D. for symptoms with moderate relief. He states the cough is aggravated with deep breathing. Symptoms associated with mild chest tightness, improved after receiving DuoNeb treatment on arrival to ED. He denies associated fever, syncope, lightheadedness, chest pain, SOB, vomiting, and weakness.  Patient is a 58 y.o. male presenting with cough. The history is provided by the patient. No language interpreter was used.  Cough Associated symptoms: no chest pain, no fever and no shortness of breath     Past Medical History  Diagnosis Date  . Myocardial infarction   . Hypertension   . Diabetes mellitus   . 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  Started HD in 2007, [facility].    Marland Kitchen. GERD (gastroesophageal reflux disease)   . Pulmonary edema     Just prior to starting HD and resolved with HD. ECHO in 2008 showed normal LVEF, mild diiastolic dysfunction.   . Neuropathy     Hx: of in toes  . Hepatitis     Hc: of Hep C  . Cataracts, both eyes     Hx: of "slightly"  . Pancreatitis     Hx: of   Past Surgical History  Procedure Laterality Date  . Dialysis fistula creation Left   . Cholecystectomy    . Colonoscopy      Hx: of  . Ligation of arteriovenous  fistula Left 11/06/2012    Procedure: PLICATION OF BRACHIO-CEPHALIC ARTERIOVENOUS  FISTULA;   Surgeon: Fransisco HertzBrian L Chen, MD;  [facility];  Service: Vascular;  Laterality: Left;   Family History  Problem Relation Age of Onset  . Diabetes Mother    History  Substance Use Topics  . Smoking status: Former Smoker -- 0.50 packs/day for 43 years    Types: Cigarettes    Quit date: 12/16/2012  . Smokeless tobacco: Never Used  . Alcohol Use: No    Review of Systems  Constitutional: Negative for fever.  Respiratory: Positive for cough and chest tightness. Negative for shortness of breath.   Cardiovascular: Negative for chest pain.  Neurological: Negative for syncope, weakness and numbness.  All other systems reviewed and are negative.     Allergies  Eggs or egg-derived products  Home Medications   Prior to Admission medications   Medication Sig Start Date End Date Taking? Authorizing Provider  amLODipine (NORVASC) 10 MG tablet Take 10 mg by mouth at bedtime.   Yes Historical Provider, MD  aspirin 325 MG EC tablet Take 325 mg by mouth daily.   Yes Historical Provider, MD  b complex-vitamin c-folic acid (NEPHRO-VITE) 0.8 MG TABS Take 0.8 mg by mouth at bedtime.   Yes Historical Provider, MD  carvedilol (COREG) 25 MG tablet Take 25 mg by mouth 2 (two) times daily with a meal.   Yes Historical Provider, MD  lisinopril (PRINIVIL,ZESTRIL) 40 MG tablet Take  40 mg by mouth daily.   Yes Historical Provider, MD  benzonatate (TESSALON) 100 MG capsule Take 2 capsules (200 mg total) by mouth 2 (two) times daily as needed for cough. 10/20/13   Antony Madura, PA-C  doxycycline (VIBRAMYCIN) 100 MG capsule Take 1 capsule (100 mg total) by mouth 2 (two) times daily. 10/20/13   Antony Madura, PA-C  predniSONE (DELTASONE) 20 MG tablet Take 2 tablets (40 mg total) by mouth daily. 10/20/13   Antony Madura, PA-C   BP 174/84  Pulse 62  Temp(Src) 98.9 F (37.2 C) (Oral)  Resp 14  Wt 189 lb (85.73 kg)  SpO2 100%  Physical Exam  Nursing note and vitals reviewed. Constitutional: He is oriented to person,  place, and time. He appears well-developed and well-nourished. No distress.  Nontoxic/nonseptic appearing  HENT:  Head: Normocephalic and atraumatic.  Mouth/Throat: Oropharynx is clear and moist. No oropharyngeal exudate.  Eyes: Conjunctivae and EOM are normal. No scleral icterus.  Neck: Normal range of motion.  Cardiovascular: Normal rate, regular rhythm and intact distal pulses.   Murmur (I/VI SEM) heard. Pulmonary/Chest: Effort normal and breath sounds normal. No respiratory distress. He has no wheezes. He has no rales.  No tachypnea or dyspnea. No retractions or accessory muscle use. Chest expansion symmetric. Dry cough appreciated at bedside.  Abdominal: Soft. There is no tenderness. There is no rebound and no guarding.  Musculoskeletal: Normal range of motion.  Neurological: He is alert and oriented to person, place, and time.  GCS 15. Patient moves extremities without ataxia. He ambulates with normal gait.  Skin: Skin is warm and dry. No rash noted. He is not diaphoretic. No erythema. No pallor.  Psychiatric: He has a normal mood and affect. His behavior is normal.    ED Course  Procedures (including critical care time) Labs Review Labs Reviewed  BASIC METABOLIC PANEL - Abnormal; Notable for the following:    Sodium 135 (*)    Chloride 92 (*)    Glucose, Bld 103 (*)    BUN 35 (*)    Creatinine, Ser 8.31 (*)    GFR calc non Af Amer 6 (*)    GFR calc Af Amer 7 (*)    All other components within normal limits  CBC - Abnormal; Notable for the following:    RBC 3.23 (*)    Hemoglobin 9.3 (*)    HCT 29.3 (*)    RDW 16.2 (*)    Platelets 61 (*)    All other components within normal limits    Imaging Review Dg Chest 2 View (if Patient Has Fever And/or Copd)  10/20/2013   CLINICAL DATA:  Cough and shortness of breath.  EXAM: CHEST  2 VIEW  COMPARISON:  Single view of the chest 12/25/2012.  FINDINGS: There is marked cardiomegaly without edema. No consolidative process,  pneumothorax or effusion. Left subclavian stent again seen.  IMPRESSION: Cardiomegaly without acute disease.   Electronically Signed   By: Drusilla Kanner M.D.   On: 10/20/2013 20:53     EKG Interpretation None      MDM   Final diagnoses:  Bronchitis  Cough    58 year old male presents for cough x 1.5 weeks. Cough productive of green tinted sputum. Patient today has no leukocytosis. No fever. X-ray shows no evidence of focal consolidation or pneumonia. Physical exam without any appreciated tachypnea or dyspnea. Patient ambulates in the ED without hypoxia. Doubt atypical PE given lack of tachycardia, tachypnea, dyspnea, or hypoxia. Labs at baseline;  patient dialyzed today.   Patient stable for d/c with Rx for doxycycline, prednisone, and tessalon for symptom management; have elected to cover with abx given comorbidities. Symptoms c/w bronchitis. PCP f/u advised and return precautions provided. Patient agreeable to plan with no unaddressed concerns.   Filed Vitals:   10/20/13 1939 10/20/13 2214 10/20/13 2230  BP: 187/58 176/85 174/84  Pulse: 64  62  Temp: 99 F (37.2 C) 98.9 F (37.2 C)   TempSrc: Oral Oral   Resp: 20 12 14   Weight: 189 lb (85.73 kg)    SpO2: 96% 100% 100%     Antony Madura, PA-C 10/20/13 2303

## 2013-10-20 NOTE — ED Notes (Signed)
Patient ready to leave, and not concerned about rechecking BP.

## 2013-10-20 NOTE — ED Notes (Signed)
Tresa Endo, [place], at the bedside.

## 2013-10-20 NOTE — ED Notes (Signed)
Patient reports he no longer makes urine anymore, and usually gets dialysis on Mon, Wed, and Fri.  His last visit, he reports 1.8L was pulled off.

## 2014-08-06 ENCOUNTER — Other Ambulatory Visit: Payer: Medicare Other

## 2014-08-06 DIAGNOSIS — B182 Chronic viral hepatitis C: Secondary | ICD-10-CM

## 2014-08-06 LAB — COMPREHENSIVE METABOLIC PANEL
ALT: 31 U/L (ref 0–53)
AST: 34 U/L (ref 0–37)
Albumin: 3.5 g/dL (ref 3.5–5.2)
Alkaline Phosphatase: 137 U/L — ABNORMAL HIGH (ref 39–117)
BILIRUBIN TOTAL: 0.4 mg/dL (ref 0.2–1.2)
BUN: 40 mg/dL — AB (ref 6–23)
CO2: 25 meq/L (ref 19–32)
Calcium: 8.7 mg/dL (ref 8.4–10.5)
Chloride: 97 mEq/L (ref 96–112)
Creat: 6.68 mg/dL — ABNORMAL HIGH (ref 0.50–1.35)
GLUCOSE: 101 mg/dL — AB (ref 70–99)
Potassium: 4.3 mEq/L (ref 3.5–5.3)
Sodium: 137 mEq/L (ref 135–145)
TOTAL PROTEIN: 7 g/dL (ref 6.0–8.3)

## 2014-08-07 LAB — CBC WITH DIFFERENTIAL/PLATELET
BASOS ABS: 0.1 10*3/uL (ref 0.0–0.1)
Basophils Relative: 1 % (ref 0–1)
EOS PCT: 13 % — AB (ref 0–5)
Eosinophils Absolute: 0.8 10*3/uL — ABNORMAL HIGH (ref 0.0–0.7)
HCT: 30.6 % — ABNORMAL LOW (ref 39.0–52.0)
Hemoglobin: 10.2 g/dL — ABNORMAL LOW (ref 13.0–17.0)
LYMPHS ABS: 1 10*3/uL (ref 0.7–4.0)
LYMPHS PCT: 16 % (ref 12–46)
MCH: 30.4 pg (ref 26.0–34.0)
MCHC: 33.3 g/dL (ref 30.0–36.0)
MCV: 91.1 fL (ref 78.0–100.0)
MPV: 9.1 fL (ref 8.6–12.4)
Monocytes Absolute: 0.5 10*3/uL (ref 0.1–1.0)
Monocytes Relative: 8 % (ref 3–12)
Neutro Abs: 3.8 10*3/uL (ref 1.7–7.7)
Neutrophils Relative %: 62 % (ref 43–77)
Platelets: 100 10*3/uL — ABNORMAL LOW (ref 150–400)
RBC: 3.36 MIL/uL — AB (ref 4.22–5.81)
RDW: 15.3 % (ref 11.5–15.5)
WBC: 6.2 10*3/uL (ref 4.0–10.5)

## 2014-08-07 LAB — HEPATITIS A ANTIBODY, TOTAL: Hep A Total Ab: NONREACTIVE

## 2014-08-07 LAB — PROTIME-INR
INR: 1.02 (ref <1.50)
Prothrombin Time: 13.4 seconds (ref 11.6–15.2)

## 2014-08-07 LAB — ANA: Anti Nuclear Antibody(ANA): NEGATIVE

## 2014-08-07 LAB — HEPATITIS B SURFACE ANTIGEN: Hepatitis B Surface Ag: NEGATIVE

## 2014-08-07 LAB — HIV ANTIBODY (ROUTINE TESTING W REFLEX): HIV 1&2 Ab, 4th Generation: NONREACTIVE

## 2014-08-08 LAB — HEPATITIS C RNA QUANTITATIVE
HCV QUANT: 1655385 [IU]/mL — AB (ref <15)
HCV Quantitative Log: 6.22 {Log} — ABNORMAL HIGH (ref <1.18)

## 2014-08-12 LAB — HEPATITIS C GENOTYPE

## 2014-08-27 ENCOUNTER — Other Ambulatory Visit: Payer: Self-pay | Admitting: Sports Medicine

## 2014-09-01 ENCOUNTER — Telehealth: Payer: Self-pay | Admitting: *Deleted

## 2014-09-01 NOTE — Telephone Encounter (Signed)
Patient called and spoke with [name] stating he is a dialysis patient and can not come for an initial appointment on any Wednesday. This is all Dr. Luciana Axeomer has available for the next 2 months. Would it be possible to work him in to another clinic. He completed his lab work on 08/06/14. [name]

## 2014-09-02 NOTE — Telephone Encounter (Signed)
Thanks anytime is fine

## 2014-09-03 ENCOUNTER — Telehealth: Payer: Self-pay | Admitting: *Deleted

## 2014-09-03 NOTE — Telephone Encounter (Signed)
Called and left patient a voice mail to let him know of his appt for Hep C with Dr. Luciana Axeomer on 09/29/14 at 11:00 AM.  Asked that he call back to confirm this appt.

## 2014-09-07 NOTE — Telephone Encounter (Signed)
Patient notified

## 2014-09-09 NOTE — Telephone Encounter (Signed)
Patient confirmed appt for 09/29/14 at 11:00 AM. Jerome MolaJacqueline Alizay Bronkema

## 2014-09-10 ENCOUNTER — Other Ambulatory Visit (HOSPITAL_COMMUNITY): Payer: Self-pay | Admitting: Cardiology

## 2014-09-10 DIAGNOSIS — R079 Chest pain, unspecified: Secondary | ICD-10-CM

## 2014-09-17 ENCOUNTER — Encounter (HOSPITAL_COMMUNITY): Payer: Medicare Other

## 2014-09-17 ENCOUNTER — Encounter (HOSPITAL_COMMUNITY): Admission: RE | Admit: 2014-09-17 | Payer: Medicare Other | Source: Ambulatory Visit

## 2014-09-17 ENCOUNTER — Encounter (HOSPITAL_COMMUNITY): Payer: Medicare Other | Attending: Cardiology

## 2014-09-29 ENCOUNTER — Emergency Department (HOSPITAL_COMMUNITY)
Admission: EM | Admit: 2014-09-29 | Discharge: 2014-09-29 | Disposition: A | Discharge status: Home / Self Care | Payer: Medicare Other | Attending: Emergency Medicine | Admitting: Emergency Medicine

## 2014-09-29 ENCOUNTER — Encounter (HOSPITAL_COMMUNITY): Payer: Self-pay | Admitting: *Deleted

## 2014-09-29 ENCOUNTER — Emergency Department (HOSPITAL_COMMUNITY): Payer: Medicare Other

## 2014-09-29 ENCOUNTER — Encounter: Payer: Medicare Other | Admitting: Internal Medicine

## 2014-09-29 DIAGNOSIS — N186 End stage renal disease: Secondary | ICD-10-CM | POA: Insufficient documentation

## 2014-09-29 DIAGNOSIS — Z8619 Personal history of other infectious and parasitic diseases: Secondary | ICD-10-CM | POA: Insufficient documentation

## 2014-09-29 DIAGNOSIS — I252 Old myocardial infarction: Secondary | ICD-10-CM | POA: Insufficient documentation

## 2014-09-29 DIAGNOSIS — Z79899 Other long term (current) drug therapy: Secondary | ICD-10-CM | POA: Insufficient documentation

## 2014-09-29 DIAGNOSIS — Z87891 Personal history of nicotine dependence: Secondary | ICD-10-CM | POA: Diagnosis not present

## 2014-09-29 DIAGNOSIS — E119 Type 2 diabetes mellitus without complications: Secondary | ICD-10-CM | POA: Insufficient documentation

## 2014-09-29 DIAGNOSIS — Z8719 Personal history of other diseases of the digestive system: Secondary | ICD-10-CM | POA: Diagnosis not present

## 2014-09-29 DIAGNOSIS — I120 Hypertensive chronic kidney disease with stage 5 chronic kidney disease or end stage renal disease: Secondary | ICD-10-CM | POA: Insufficient documentation

## 2014-09-29 DIAGNOSIS — Z992 Dependence on renal dialysis: Secondary | ICD-10-CM | POA: Diagnosis not present

## 2014-09-29 DIAGNOSIS — Z792 Long term (current) use of antibiotics: Secondary | ICD-10-CM | POA: Insufficient documentation

## 2014-09-29 DIAGNOSIS — J441 Chronic obstructive pulmonary disease with (acute) exacerbation: Secondary | ICD-10-CM | POA: Diagnosis not present

## 2014-09-29 DIAGNOSIS — Z8669 Personal history of other diseases of the nervous system and sense organs: Secondary | ICD-10-CM | POA: Insufficient documentation

## 2014-09-29 DIAGNOSIS — R05 Cough: Secondary | ICD-10-CM | POA: Diagnosis present

## 2014-09-29 LAB — CBC WITH DIFFERENTIAL/PLATELET
Basophils Absolute: 0.1 10*3/uL (ref 0.0–0.1)
Basophils Relative: 1 % (ref 0–1)
EOS ABS: 0.6 10*3/uL (ref 0.0–0.7)
Eosinophils Relative: 10 % — ABNORMAL HIGH (ref 0–5)
HCT: 32.5 % — ABNORMAL LOW (ref 39.0–52.0)
HEMOGLOBIN: 10.7 g/dL — AB (ref 13.0–17.0)
LYMPHS ABS: 0.8 10*3/uL (ref 0.7–4.0)
Lymphocytes Relative: 12 % (ref 12–46)
MCH: 29.7 pg (ref 26.0–34.0)
MCHC: 32.9 g/dL (ref 30.0–36.0)
MCV: 90.3 fL (ref 78.0–100.0)
MONOS PCT: 10 % (ref 3–12)
Monocytes Absolute: 0.7 10*3/uL (ref 0.1–1.0)
Neutro Abs: 4.3 10*3/uL (ref 1.7–7.7)
Neutrophils Relative %: 66 % (ref 43–77)
Platelets: 91 10*3/uL — ABNORMAL LOW (ref 150–400)
RBC: 3.6 MIL/uL — ABNORMAL LOW (ref 4.22–5.81)
RDW: 16 % — AB (ref 11.5–15.5)
WBC: 6.5 10*3/uL (ref 4.0–10.5)

## 2014-09-29 LAB — COMPREHENSIVE METABOLIC PANEL
ALT: 30 U/L (ref 17–63)
ANION GAP: 13 (ref 5–15)
AST: 35 U/L (ref 15–41)
Albumin: 3 g/dL — ABNORMAL LOW (ref 3.5–5.0)
Alkaline Phosphatase: 162 U/L — ABNORMAL HIGH (ref 38–126)
BILIRUBIN TOTAL: 0.5 mg/dL (ref 0.3–1.2)
BUN: 52 mg/dL — AB (ref 6–20)
CO2: 29 mmol/L (ref 22–32)
Calcium: 9.2 mg/dL (ref 8.9–10.3)
Chloride: 92 mmol/L — ABNORMAL LOW (ref 101–111)
Creatinine, Ser: 8.66 mg/dL — ABNORMAL HIGH (ref 0.61–1.24)
GFR, EST AFRICAN AMERICAN: 7 mL/min — AB (ref >60)
GFR, EST NON AFRICAN AMERICAN: 6 mL/min — AB (ref >60)
GLUCOSE: 136 mg/dL — AB (ref 65–99)
POTASSIUM: 3.9 mmol/L (ref 3.5–5.1)
SODIUM: 134 mmol/L — AB (ref 135–145)
TOTAL PROTEIN: 7.5 g/dL (ref 6.5–8.1)

## 2014-09-29 LAB — I-STAT CG4 LACTIC ACID, ED: Lactic Acid, Venous: 1.23 mmol/L (ref 0.5–2.0)

## 2014-09-29 MED ORDER — PREDNISONE 20 MG PO TABS
60.0000 mg | ORAL_TABLET | Freq: Once | ORAL | Status: AC
  Administered 2014-09-29: 60 mg via ORAL
  Filled 2014-09-29: qty 3

## 2014-09-29 MED ORDER — PREDNISONE 50 MG PO TABS: 50.0000 mg | ORAL_TABLET | Freq: Every day | ORAL | Status: DC

## 2014-09-29 MED ORDER — ALBUTEROL SULFATE (2.5 MG/3ML) 0.083% IN NEBU
5.0000 mg | INHALATION_SOLUTION | Freq: Once | RESPIRATORY_TRACT | Status: AC
  Administered 2014-09-29: 5 mg via RESPIRATORY_TRACT
  Filled 2014-09-29: qty 6

## 2014-09-29 MED ORDER — IPRATROPIUM-ALBUTEROL 0.5-2.5 (3) MG/3ML IN SOLN
3.0000 mL | RESPIRATORY_TRACT | Status: DC
  Administered 2014-09-29: 3 mL via RESPIRATORY_TRACT
  Filled 2014-09-29: qty 3

## 2014-09-29 MED ORDER — ALBUTEROL SULFATE HFA 108 (90 BASE) MCG/ACT IN AERS
1.0000 | INHALATION_SPRAY | Freq: Four times a day (QID) | RESPIRATORY_TRACT | Status: DC | PRN
  Filled 2014-09-29: qty 6.7

## 2014-09-29 NOTE — Discharge Instructions (Signed)

## 2014-09-29 NOTE — ED Provider Notes (Signed)
CSN: [id number]     Arrival date & time 09/29/14  1412 History   First MD Initiated Contact with Patient 09/29/14 1424      [id number]  Patient is a 59 y.o. male presenting with cough. The history is provided by the patient.  Cough Cough characteristics:  Productive Sputum characteristics:  Green Severity:  Severe Onset quality:  Gradual Duration:  4 days Timing:  Constant Context: sick contacts   Worsened by:  Nothing tried Associated symptoms: rhinorrhea, sinus congestion and sore throat   Associated symptoms: no fever   Pt does have renal failure.  He had dialysis yesterday as scheduled (MWF).  He has not been able to sleep well.  Not sure if he has had a fever but they have not mentioned it at dialysis.  Past Medical History  Diagnosis Date  . Myocardial infarction   . Hypertension   . Diabetes mellitus   . 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  Started HD in 2007, gets HD at [facility] on TTS schedule.    Marland Kitchen GERD (gastroesophageal reflux disease)   . Pulmonary edema     Just prior to starting HD and resolved with HD. ECHO in 2008 showed normal LVEF, mild diiastolic dysfunction.   . Neuropathy     Hx: of in toes  . Hepatitis     Hc: of Hep C  . Cataracts, both eyes     Hx: of "slightly"  . Pancreatitis     Hx: of   Past Surgical History  Procedure Laterality Date  . Dialysis fistula creation Left   . Cholecystectomy    . Colonoscopy      Hx: of  . Ligation of arteriovenous  fistula Left 11/06/2012    Procedure: PLICATION OF BRACHIO-CEPHALIC ARTERIOVENOUS  FISTULA;  Surgeon: Fransisco Hertz, MD;  Location: [facility] OR;  Service: Vascular;  Laterality: Left;   Family History  Problem Relation Age of Onset  . Diabetes Mother    History  Substance Use Topics  . Smoking status: Former Smoker -- 0.50 packs/day for 43 years    Types: Cigarettes    Quit date: 12/16/2012  . Smokeless tobacco: Never Used  . Alcohol Use: No    Review of Systems   Constitutional: Negative for fever.  HENT: Positive for rhinorrhea and sore throat.   Respiratory: Positive for cough.   All other systems reviewed and are negative.     Allergies  Eggs or egg-derived products  Home Medications   Prior to Admission medications   Medication Sig Start Date End Date Taking? Authorizing Provider  ALPRAZolam Prudy Feeler) 0.25 MG tablet Take 0.25 mg by mouth at bedtime as needed for sleep.  09/10/14  Yes Historical Provider, MD  amLODipine (NORVASC) 10 MG tablet Take 10 mg by mouth at bedtime.   Yes Historical Provider, MD  b complex-vitamin c-folic acid (NEPHRO-VITE) 0.8 MG TABS Take 0.8 mg by mouth at bedtime.   Yes Historical Provider, MD  carvedilol (COREG) 3.125 MG tablet Take 3.125 mg by mouth 2 (two) times daily with a meal.   Yes Historical Provider, MD  lanthanum (FOSRENOL) 500 MG chewable tablet Chew 500 mg by mouth 3 (three) times daily with meals.   Yes Historical Provider, MD  lisinopril (PRINIVIL,ZESTRIL) 40 MG tablet Take 40 mg by mouth daily.   Yes Historical Provider, MD  PROAIR HFA 108 (90 BASE) MCG/ACT inhaler Inhale 1 puff into the lungs every 4 (  four) hours as needed. Wheezing 09/22/14  Yes Historical Provider, MD  benzonatate (TESSALON) 100 MG capsule Take 2 capsules (200 mg total) by mouth 2 (two) times daily as needed for cough. Patient not taking: Reported on 09/29/2014 10/20/13   Antony MaduraKelly Humes, PA-C  carvedilol (COREG) 25 MG tablet Take 25 mg by mouth 2 (two) times daily with a meal.    Historical Provider, MD  doxycycline (VIBRAMYCIN) 100 MG capsule Take 1 capsule (100 mg total) by mouth 2 (two) times daily. Patient not taking: Reported on 09/29/2014 10/20/13   Antony MaduraKelly Humes, PA-C  predniSONE (DELTASONE) 50 MG tablet Take 1 tablet (50 mg total) by mouth daily. 09/29/14   Linwood DibblesJon Luree Palla, MD   BP 175/68 mmHg  Pulse 62  Temp(Src) 98.9 F (37.2 C) (Oral)  Resp 18  Wt 170 lb (77.111 kg)  SpO2 100% Physical Exam  Constitutional: He appears  well-developed and well-nourished. No distress.  HENT:  Head: Normocephalic and atraumatic.  Right Ear: External ear normal.  Left Ear: External ear normal.  Mouth/Throat: No oropharyngeal exudate.  Eyes: Conjunctivae are normal. Right eye exhibits no discharge. Left eye exhibits no discharge. No scleral icterus.  Neck: Neck supple. No tracheal deviation present.  Cardiovascular: Normal rate, regular rhythm and intact distal pulses.   Pulmonary/Chest: Effort normal. No stridor. No respiratory distress. He has wheezes. He has no rales.  Abdominal: Soft. Bowel sounds are normal. He exhibits no distension. There is no tenderness. There is no rebound and no guarding.  Musculoskeletal: He exhibits no edema or tenderness.  Neurological: He is alert. He has normal strength. No cranial nerve deficit (no facial droop, extraocular movements intact, no slurred speech) or sensory deficit. He exhibits normal muscle tone. He displays no seizure activity. Coordination normal.  Skin: Skin is warm and dry. No rash noted.  Psychiatric: He has a normal mood and affect.  Nursing note and vitals reviewed.   ED Course  Procedures (including critical care time) Labs Review Labs Reviewed  CBC WITH DIFFERENTIAL/PLATELET - Abnormal; Notable for the following:    RBC 3.60 (*)    Hemoglobin 10.7 (*)    HCT 32.5 (*)    RDW 16.0 (*)    Platelets 91 (*)    Eosinophils Relative 10 (*)    All other components within normal limits  COMPREHENSIVE METABOLIC PANEL - Abnormal; Notable for the following:    Sodium 134 (*)    Chloride 92 (*)    Glucose, Bld 136 (*)    BUN 52 (*)    Creatinine, Ser 8.66 (*)    Albumin 3.0 (*)    Alkaline Phosphatase 162 (*)    GFR calc non Af Amer 6 (*)    GFR calc Af Amer 7 (*)    All other components within normal limits  I-STAT CG4 LACTIC ACID, ED    Imaging Review Dg Chest 2 View  09/29/2014   CLINICAL DATA:  59 year old male with a history of cough and congestion.  EXAM:  CHEST - 2 VIEW  COMPARISON:  Multiple prior plain film, most recent 10/20/2013, 12/25/2012. CT 12/23/2012  FINDINGS: Cardiomediastinal silhouette unchanged in size and contour, with cardiomegaly.  Prominence of central vasculature and the pulmonary vessels, as was seen on prior plain film and CT. Interstitial opacities persist.  Improved pleural fluid within the fissures.  No pneumothorax.  No confluent airspace disease.  Vascular stent in the left upper extremity, in the region of the subclavian vessels.  No acute fracture.  IMPRESSION:  Persisting cardiomegaly and engorgement of the central vasculature, similar to prior plain films. Early edema cannot be excluded.  Improved pleural fluid since the prior.  No evidence of lobar pneumonia.  Signed,  Yvone NeuJaime S. Loreta AveWagner, DO  [facility]  [facility]   Electronically Signed   By: Gilmer MorJaime  Wagner D.O.   On: 09/29/2014 15:06   Medications  ipratropium-albuterol (DUONEB) 0.5-2.5 (3) MG/3ML nebulizer solution 3 mL (3 mLs Nebulization Given 09/29/14 1534)  albuterol (PROVENTIL) (2.5 MG/3ML) 0.083% nebulizer solution 5 mg (not administered)  albuterol (PROVENTIL HFA;VENTOLIN HFA) 108 (90 BASE) MCG/ACT inhaler 1-2 puff (not administered)  predniSONE (DELTASONE) tablet 60 mg (60 mg Oral Given 09/29/14 1533)     MDM   Final diagnoses:  COPD exacerbation   After initial exam pt still has some wheezing but he is feeling better, breathing more easily.  He would like one more treatment and plans to go home.  No pna on cxr.  Doubt pulm edema.  He has dialysis tomorrow.   Will give him an albuterol HFA and oral steroids.  DIscussed his symptoms related to his cigarette smoking.      Linwood DibblesJon Bedie Dominey, MD 09/29/14 [phone]

## 2014-09-29 NOTE — ED Notes (Signed)
Pt in c/o cough and congestion for the last three days, cough is productive with green sputum, denies fever but reports chills, also nasal congestion

## 2014-10-05 ENCOUNTER — Encounter (HOSPITAL_COMMUNITY): Payer: Self-pay | Admitting: Emergency Medicine

## 2014-10-05 ENCOUNTER — Emergency Department (HOSPITAL_COMMUNITY): Payer: Medicare Other

## 2014-10-05 ENCOUNTER — Emergency Department (HOSPITAL_COMMUNITY)
Admission: EM | Admit: 2014-10-05 | Discharge: 2014-10-05 | Disposition: A | Discharge status: Home / Self Care | Payer: Medicare Other | Attending: Emergency Medicine | Admitting: Emergency Medicine

## 2014-10-05 DIAGNOSIS — R0981 Nasal congestion: Secondary | ICD-10-CM | POA: Insufficient documentation

## 2014-10-05 DIAGNOSIS — R197 Diarrhea, unspecified: Secondary | ICD-10-CM

## 2014-10-05 DIAGNOSIS — Z7982 Long term (current) use of aspirin: Secondary | ICD-10-CM | POA: Diagnosis not present

## 2014-10-05 DIAGNOSIS — N186 End stage renal disease: Secondary | ICD-10-CM | POA: Insufficient documentation

## 2014-10-05 DIAGNOSIS — Z992 Dependence on renal dialysis: Secondary | ICD-10-CM | POA: Diagnosis not present

## 2014-10-05 DIAGNOSIS — R110 Nausea: Secondary | ICD-10-CM

## 2014-10-05 DIAGNOSIS — I120 Hypertensive chronic kidney disease with stage 5 chronic kidney disease or end stage renal disease: Secondary | ICD-10-CM | POA: Diagnosis not present

## 2014-10-05 DIAGNOSIS — I252 Old myocardial infarction: Secondary | ICD-10-CM | POA: Insufficient documentation

## 2014-10-05 DIAGNOSIS — Z7952 Long term (current) use of systemic steroids: Secondary | ICD-10-CM | POA: Diagnosis not present

## 2014-10-05 DIAGNOSIS — R05 Cough: Secondary | ICD-10-CM | POA: Diagnosis not present

## 2014-10-05 DIAGNOSIS — G629 Polyneuropathy, unspecified: Secondary | ICD-10-CM | POA: Diagnosis not present

## 2014-10-05 DIAGNOSIS — R1011 Right upper quadrant pain: Secondary | ICD-10-CM | POA: Diagnosis present

## 2014-10-05 DIAGNOSIS — K529 Noninfective gastroenteritis and colitis, unspecified: Secondary | ICD-10-CM

## 2014-10-05 DIAGNOSIS — E119 Type 2 diabetes mellitus without complications: Secondary | ICD-10-CM | POA: Insufficient documentation

## 2014-10-05 DIAGNOSIS — Z792 Long term (current) use of antibiotics: Secondary | ICD-10-CM | POA: Diagnosis not present

## 2014-10-05 DIAGNOSIS — Z87891 Personal history of nicotine dependence: Secondary | ICD-10-CM | POA: Insufficient documentation

## 2014-10-05 DIAGNOSIS — Z8619 Personal history of other infectious and parasitic diseases: Secondary | ICD-10-CM | POA: Insufficient documentation

## 2014-10-05 DIAGNOSIS — Z9889 Other specified postprocedural states: Secondary | ICD-10-CM | POA: Insufficient documentation

## 2014-10-05 DIAGNOSIS — Z9049 Acquired absence of other specified parts of digestive tract: Secondary | ICD-10-CM | POA: Insufficient documentation

## 2014-10-05 DIAGNOSIS — Z79899 Other long term (current) drug therapy: Secondary | ICD-10-CM | POA: Diagnosis not present

## 2014-10-05 LAB — CBC WITH DIFFERENTIAL/PLATELET
Basophils Absolute: 0 10*3/uL (ref 0.0–0.1)
Basophils Relative: 0 % (ref 0–1)
EOS ABS: 0.3 10*3/uL (ref 0.0–0.7)
EOS PCT: 4 % (ref 0–5)
HCT: 35.4 % — ABNORMAL LOW (ref 39.0–52.0)
HEMOGLOBIN: 11.6 g/dL — AB (ref 13.0–17.0)
LYMPHS ABS: 1.1 10*3/uL (ref 0.7–4.0)
LYMPHS PCT: 13 % (ref 12–46)
MCH: 29.5 pg (ref 26.0–34.0)
MCHC: 32.8 g/dL (ref 30.0–36.0)
MCV: 90.1 fL (ref 78.0–100.0)
MONO ABS: 0.6 10*3/uL (ref 0.1–1.0)
Monocytes Relative: 6 % (ref 3–12)
NEUTROS PCT: 77 % (ref 43–77)
Neutro Abs: 7 10*3/uL (ref 1.7–7.7)
PLATELETS: 129 10*3/uL — AB (ref 150–400)
RBC: 3.93 MIL/uL — ABNORMAL LOW (ref 4.22–5.81)
RDW: 17.1 % — ABNORMAL HIGH (ref 11.5–15.5)
WBC: 9 10*3/uL (ref 4.0–10.5)

## 2014-10-05 LAB — LIPASE, BLOOD: Lipase: 74 U/L — ABNORMAL HIGH (ref 22–51)

## 2014-10-05 LAB — I-STAT TROPONIN, ED: TROPONIN I, POC: 0.07 ng/mL (ref 0.00–0.08)

## 2014-10-05 LAB — COMPREHENSIVE METABOLIC PANEL
ALBUMIN: 3.1 g/dL — AB (ref 3.5–5.0)
ALT: 36 U/L (ref 17–63)
ANION GAP: 14 (ref 5–15)
AST: 38 U/L (ref 15–41)
Alkaline Phosphatase: 119 U/L (ref 38–126)
BUN: 115 mg/dL — ABNORMAL HIGH (ref 6–20)
CO2: 20 mmol/L — ABNORMAL LOW (ref 22–32)
CREATININE: 9.7 mg/dL — AB (ref 0.61–1.24)
Calcium: 9.1 mg/dL (ref 8.9–10.3)
Chloride: 97 mmol/L — ABNORMAL LOW (ref 101–111)
GFR calc non Af Amer: 5 mL/min — ABNORMAL LOW (ref >60)
GFR, EST AFRICAN AMERICAN: 6 mL/min — AB (ref >60)
Glucose, Bld: 140 mg/dL — ABNORMAL HIGH (ref 65–99)
POTASSIUM: 4.2 mmol/L (ref 3.5–5.1)
Sodium: 131 mmol/L — ABNORMAL LOW (ref 135–145)
Total Bilirubin: 0.6 mg/dL (ref 0.3–1.2)
Total Protein: 7 g/dL (ref 6.5–8.1)

## 2014-10-05 MED ORDER — METRONIDAZOLE 500 MG PO TABS: 500.0000 mg | ORAL_TABLET | Freq: Two times a day (BID) | ORAL | Status: DC

## 2014-10-05 MED ORDER — ONDANSETRON HCL 4 MG PO TABS: 4.0000 mg | ORAL_TABLET | Freq: Four times a day (QID) | ORAL | Status: DC

## 2014-10-05 MED ORDER — LEVOFLOXACIN 750 MG PO TABS
750.0000 mg | ORAL_TABLET | Freq: Once | ORAL | Status: AC
  Administered 2014-10-05: 750 mg via ORAL
  Filled 2014-10-05: qty 1

## 2014-10-05 MED ORDER — LEVOFLOXACIN 500 MG PO TABS: 500.0000 mg | ORAL_TABLET | ORAL | Status: DC

## 2014-10-05 MED ORDER — ONDANSETRON HCL 4 MG/2ML IJ SOLN
4.0000 mg | Freq: Once | INTRAMUSCULAR | Status: AC
  Administered 2014-10-05: 4 mg via INTRAVENOUS
  Filled 2014-10-05: qty 2

## 2014-10-05 MED ORDER — HYDROMORPHONE HCL 1 MG/ML IJ SOLN
1.0000 mg | Freq: Once | INTRAMUSCULAR | Status: AC
  Administered 2014-10-05: 1 mg via INTRAVENOUS
  Filled 2014-10-05: qty 1

## 2014-10-05 NOTE — ED Notes (Signed)
Off unit with xray. 

## 2014-10-05 NOTE — ED Notes (Signed)
Pt returned from CT °

## 2014-10-05 NOTE — ED Provider Notes (Signed)
CSN: [id number]     Arrival date & time 10/05/14  7253 History   First MD Initiated Contact with Patient 10/05/14 0935     Chief Complaint  Patient presents with  . 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 2016, but states pain significantly worsened last night. Pain is cramping and sharp, constant, 10/10, nothing makes pain better. States he tried drinking a "morning juice" but states he had diarrhea last night and this morning and thinks the juice made symptoms worse.  Pt states he is due for dialysis today as it is Monday but has not missed any sessions recently until today.  Denies fever, chills or vomiting but does report nausea, cough and congestion. States he was seen in ED last week for URI and was going to come back for recheck of URI symptoms but abdominal pain was more severe than his cough. Pt does note distension of his abdomen but states this is chronic.   Past Medical History  Diagnosis Date  . Myocardial infarction   . Hypertension   . Diabetes mellitus   . 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  Started HD in 2007, gets HD at [facility] on TTS schedule.    Marland Kitchen GERD (gastroesophageal reflux disease)   . Pulmonary edema     Just prior to starting HD and resolved with HD. ECHO in 2008 showed normal LVEF, mild diiastolic dysfunction.   . Neuropathy     Hx: of in toes  . Hepatitis     Hc: of Hep C  . Cataracts, both eyes     Hx: of "slightly"  . Pancreatitis     Hx: of   Past Surgical History  Procedure Laterality Date  . Dialysis fistula creation Left   . Cholecystectomy    . Colonoscopy      Hx: of  . Ligation of  arteriovenous  fistula Left 11/06/2012    Procedure: PLICATION OF BRACHIO-CEPHALIC ARTERIOVENOUS  FISTULA;  Surgeon: Fransisco Hertz, MD;  Location: [facility] OR;  Service: Vascular;  Laterality: Left;   Family History  Problem Relation Age of Onset  . Diabetes Mother    History  Substance Use Topics  . Smoking status: Former Smoker -- 0.50 packs/day for 43 years    Types: Cigarettes    Quit date: 12/16/2012  . Smokeless tobacco: Never Used  . Alcohol Use: No    Review of Systems  Constitutional: Positive for appetite change. Negative for fever, chills and fatigue.  HENT: Positive for congestion. Negative for sore throat, trouble swallowing and voice change.   Respiratory: Positive for cough. Negative for shortness of breath.   Cardiovascular: Negative for chest pain, palpitations and leg swelling.  Gastrointestinal: Positive for nausea, abdominal pain (RUQ) and diarrhea. Negative for vomiting, constipation and blood in stool.  All other systems reviewed and are negative.     Allergies  Eggs or egg-derived products  Home Medications   Prior to Admission medications   Medication Sig Start Date End Date Taking? Authorizing Provider  ALPRAZolam Prudy Feeler) 0.25 MG tablet Take 0.25 mg by mouth at bedtime as needed for sleep.  09/10/14  Yes  Historical Provider, MD  amLODipine (NORVASC) 5 MG tablet Take 5 mg by mouth 2 (two) times daily.   Yes Historical Provider, MD  aspirin 81 MG tablet Take 81 mg by mouth daily.   Yes Historical Provider, MD  b complex-vitamin c-folic acid (NEPHRO-VITE) 0.8 MG TABS Take 0.8 mg by mouth at bedtime.   Yes Historical Provider, MD  benzonatate (TESSALON) 100 MG capsule Take 2 capsules (200 mg total) by mouth 2 (two) times daily as needed for cough. 10/20/13  Yes Antony Madura, PA-C  carvedilol (COREG) 6.25 MG tablet Take 6.25 mg by mouth 2 (two) times daily with a meal.   Yes Historical Provider, MD  hydrALAZINE (APRESOLINE) 50 MG tablet Take 50 mg by mouth 3 (three)  times daily.   Yes Historical Provider, MD  lanthanum (FOSRENOL) 500 MG chewable tablet Chew 500 mg by mouth 3 (three) times daily with meals.   Yes Historical Provider, MD  lisinopril (PRINIVIL,ZESTRIL) 40 MG tablet Take 40 mg by mouth daily.   Yes Historical Provider, MD  predniSONE (DELTASONE) 50 MG tablet Take 1 tablet (50 mg total) by mouth daily. 09/29/14  Yes Linwood Dibbles, MD  PROAIR HFA 108 (90 BASE) MCG/ACT inhaler Inhale 1 puff into the lungs every 4 (four) hours as needed. Wheezing 09/22/14  Yes Historical Provider, MD  amLODipine (NORVASC) 10 MG tablet Take 10 mg by mouth at bedtime.    Historical Provider, MD  carvedilol (COREG) 25 MG tablet Take 25 mg by mouth 2 (two) times daily with a meal.    Historical Provider, MD  carvedilol (COREG) 3.125 MG tablet Take 3.125 mg by mouth 2 (two) times daily with a meal.    Historical Provider, MD  doxycycline (VIBRAMYCIN) 100 MG capsule Take 1 capsule (100 mg total) by mouth 2 (two) times daily. Patient not taking: Reported on 09/29/2014 10/20/13   Antony Madura, PA-C  levofloxacin (LEVAQUIN) 500 MG tablet Take 1 tablet (500 mg total) by mouth every other day. For 3 doses 10/05/14   Junius Finner, PA-C  metroNIDAZOLE (FLAGYL) 500 MG tablet Take 1 tablet (500 mg total) by mouth 2 (two) times daily. One po bid x 7 days 10/05/14   Junius Finner, PA-C  ondansetron (ZOFRAN) 4 MG tablet Take 1 tablet (4 mg total) by mouth every 6 (six) hours. 10/05/14   Junius Finner, PA-C   BP 196/152 mmHg  Pulse 73  Temp(Src) 98.7 F (37.1 C) (Oral)  Resp 16  Ht  (1.778 m)  Wt 170 lb (77.111 kg)  BMI 24.39 kg/m2  SpO2 94% Physical Exam  Constitutional: He appears well-developed and well-nourished. He appears distressed.  Pt lying in exam bed, rubbing his abdomen, appears uncomfortable.   HENT:  Head: Normocephalic and atraumatic.  Eyes: Conjunctivae are normal. No scleral icterus.  Neck: Normal range of motion.  Cardiovascular: Normal rate, regular rhythm and  normal heart sounds.   Pulmonary/Chest: Effort normal and breath sounds normal. No respiratory distress. He has no wheezes. He has no rales. He exhibits no tenderness.  Abdominal: Soft. Bowel sounds are normal. He exhibits distension. He exhibits no mass. There is tenderness. There is guarding. There is no rebound.  Multiple well healed surgical scars. Mild abdominal distension, diffuse Right sided abdominal tenderness, worse in RUQ with voluntary guarding. No masses palpated.   Musculoskeletal: Normal range of motion.  Neurological: He is alert.  Skin: Skin is warm and dry. He is not diaphoretic.  Nursing note and vitals reviewed.   ED  Course  Procedures (including critical care time) Labs Review Labs Reviewed  CBC WITH DIFFERENTIAL/PLATELET - Abnormal; Notable for the following:    RBC 3.93 (*)    Hemoglobin 11.6 (*)    HCT 35.4 (*)    RDW 17.1 (*)    Platelets 129 (*)    All other components within normal limits  COMPREHENSIVE METABOLIC PANEL - Abnormal; Notable for the following:    Sodium 131 (*)    Chloride 97 (*)    CO2 20 (*)    Glucose, Bld 140 (*)    BUN 115 (*)    Creatinine, Ser 9.70 (*)    Albumin 3.1 (*)    GFR calc non Af Amer 5 (*)    GFR calc Af Amer 6 (*)    All other components within normal limits  LIPASE, BLOOD - Abnormal; Notable for the following:    Lipase 74 (*)    All other components within normal limits  I-STAT TROPOININ, ED    Imaging Review Ct Abdomen Pelvis Wo Contrast  10/05/2014   CLINICAL DATA:  Pain in the right upper quadrant. History of cirrhosis, dialysis and hepatitis-C.  EXAM: CT ABDOMEN AND PELVIS WITHOUT CONTRAST  TECHNIQUE: Multidetector CT imaging of the abdomen and pelvis was performed following the standard protocol without IV contrast.  COMPARISON:  02/14/2010  FINDINGS: Lung bases are clear. Heart is prominent for size but similar to the prior examination. There is no evidence for free intraperitoneal air.  Unenhanced CT was  performed per clinician order. Lack of IV contrast limits sensitivity and specificity, especially for evaluation of abdominal/pelvic solid viscera.  Question trace perihepatic ascites. Subtle nodularity of the liver is suggestive for cirrhosis. Spleen is prominent in size, measuring 13.4 cm in the AP dimension. The gallbladder has been removed. Limited evaluation of the porta hepatis and pancreatic head region due to the lack of intravenous contrast. No gross abnormality to the pancreas or adrenal glands. Again noted are bilateral atrophic kidneys with renal cysts. Few calcifications in the kidneys could be vascular or nonobstructive stones. Negative for hydronephrosis. Atherosclerotic calcifications in the aorta and iliac arteries. No significant lymphadenopathy.  No gross abnormality to the prostate and urinary bladder is decompressed. No acute abnormality to the small bowel and appendix. The colon wall is mildly thickened, particularly in the right colon. These areas of colonic wall thickening could be related to incomplete distension and there is no significant pericolonic inflammation.  No acute bone abnormality.  IMPRESSION: Nodularity of the liver is compatible with cirrhosis. Question a trace amount of perihepatic ascites.  There is diffuse wall thickening of the colon without surrounding inflammatory changes. These findings are indeterminate. Recommend clinical correlation with regards to possible colonic inflammation.  Bilateral renal atrophy with bilateral renal calcifications and cyst. Renal calcifications could represent atherosclerotic calcifications and/or small nonobstructive stones.   Electronically Signed   By: Richarda OverlieAdam  Henn M.D.   On: 10/05/2014 11:29   Dg Chest 2 View  10/05/2014   CLINICAL DATA:  Productive cough  EXAM: CHEST  2 VIEW  COMPARISON:  09/29/2014  FINDINGS: Cardiomediastinal silhouette is stable. Persistent central vascular congestion without convincing pulmonary edema. Mild  infrahilar bronchitic changes right greater than left. No segmental infiltrate.  IMPRESSION: Persistent central vascular congestion without convincing pulmonary edema. Mild infrahilar bronchitic changes right greater than left. No segmental infiltrate.   Electronically Signed   By: Natasha MeadLiviu  Pop M.D.   On: 10/05/2014 11:13     EKG Interpretation None  MDM   Final diagnoses:  Colitis  RUQ abdominal pain  Diarrhea  Nausea   Pt is a dialysis pt with hx of Hep C. Presenting to ED with c/o RUQ abdominal pain as well as nausea and diarrhea that started yesterday.   Abdomen is soft, tender in RUQ, mild distension, hx of distension. Pt has already had a cholecystectomy.  Labs: c/w previous CT abd: c/w colitis.   Discussed pt with Dr. Silverio Lay who also examined pt.  Will tx colitis with flagyl and Levaquin. Renal doses reviewed. First dose of  Levaquin in ED, discharged home with PO Levaquin  every other day x3 doses. Flagyl  BID for 7 days. Rx: zofran. Advised to f/u with PCP. Return precautions provided. Pt verbalized understanding and agreement with tx plan.     Junius Finner, PA-C 10/05/14 1541  Richardean Canal, MD 10/06/14 [phone]

## 2014-10-05 NOTE — ED Notes (Signed)
Patient coming from home with c/o of pain in the RUQ that has "been ongoing for a while, since I came back from [place] in February".  Patient is very tearful during triage.  Pain 10/10.

## 2014-10-08 ENCOUNTER — Encounter (HOSPITAL_COMMUNITY)
Admission: RE | Admit: 2014-10-08 | Discharge: 2014-10-08 | Disposition: A | Discharge status: Home / Self Care | Payer: Medicare Other | Source: Ambulatory Visit | Attending: Cardiology | Admitting: Cardiology

## 2014-10-08 DIAGNOSIS — R079 Chest pain, unspecified: Secondary | ICD-10-CM | POA: Diagnosis present

## 2014-10-08 MED ORDER — TECHNETIUM TC 99M SESTAMIBI GENERIC - CARDIOLITE
10.0000 | Freq: Once | INTRAVENOUS | Status: AC | PRN
  Administered 2014-10-08: 10 via INTRAVENOUS

## 2014-10-08 MED ORDER — REGADENOSON 0.4 MG/5ML IV SOLN
INTRAVENOUS | Status: AC
  Filled 2014-10-08: qty 5

## 2014-10-08 MED ORDER — REGADENOSON 0.4 MG/5ML IV SOLN
0.4000 mg | Freq: Once | INTRAVENOUS | Status: AC
  Administered 2014-10-08: 0.4 mg via INTRAVENOUS

## 2014-10-08 MED ORDER — TECHNETIUM TC 99M SESTAMIBI GENERIC - CARDIOLITE
30.0000 | Freq: Once | INTRAVENOUS | Status: AC | PRN
  Administered 2014-10-08: 30 via INTRAVENOUS

## 2014-10-26 ENCOUNTER — Encounter: Payer: Medicare Other | Admitting: Internal Medicine

## 2014-10-26 ENCOUNTER — Ambulatory Visit: Payer: Medicare Other | Admitting: Internal Medicine

## 2014-11-05 ENCOUNTER — Encounter (INDEPENDENT_AMBULATORY_CARE_PROVIDER_SITE_OTHER): Payer: Medicare Other | Admitting: Ophthalmology

## 2014-11-05 DIAGNOSIS — H35033 Hypertensive retinopathy, bilateral: Secondary | ICD-10-CM | POA: Diagnosis not present

## 2014-11-05 DIAGNOSIS — E11359 Type 2 diabetes mellitus with proliferative diabetic retinopathy without macular edema: Secondary | ICD-10-CM | POA: Diagnosis not present

## 2014-11-05 DIAGNOSIS — H43813 Vitreous degeneration, bilateral: Secondary | ICD-10-CM

## 2014-11-05 DIAGNOSIS — E11339 Type 2 diabetes mellitus with moderate nonproliferative diabetic retinopathy without macular edema: Secondary | ICD-10-CM | POA: Diagnosis not present

## 2014-11-05 DIAGNOSIS — H43811 Vitreous degeneration, right eye: Secondary | ICD-10-CM

## 2014-11-05 DIAGNOSIS — I10 Essential (primary) hypertension: Secondary | ICD-10-CM | POA: Diagnosis not present

## 2014-11-05 DIAGNOSIS — E11319 Type 2 diabetes mellitus with unspecified diabetic retinopathy without macular edema: Secondary | ICD-10-CM | POA: Diagnosis not present

## 2014-11-05 NOTE — H&P (Signed)
[name] is an 59 y.o. male.   Chief Complaint:sudden and profound loss of vision, right eye HPI: diabetic with severe vitreous hemorrhage, right eye  Past Medical History  Diagnosis Date  . Myocardial infarction   . Hypertension   . Diabetes mellitus   . 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  Started HD in 2007, gets HD at [facility] on TTS schedule.    Marland Kitchen GERD (gastroesophageal reflux disease)   . Pulmonary edema     Just prior to starting HD and resolved with HD. ECHO in 2008 showed normal LVEF, mild diiastolic dysfunction.   . Neuropathy     Hx: of in toes  . Hepatitis     Hc: of Hep C  . Cataracts, both eyes     Hx: of "slightly"  . Pancreatitis     Hx: of    Past Surgical History  Procedure Laterality Date  . Dialysis fistula creation Left   . Cholecystectomy    . Colonoscopy      Hx: of  . Ligation of arteriovenous  fistula Left 11/06/2012    Procedure: PLICATION OF BRACHIO-CEPHALIC ARTERIOVENOUS  FISTULA;  Surgeon: Fransisco Hertz, MD;  Location: [facility] OR;  Service: Vascular;  Laterality: Left;    Family History  Problem Relation Age of Onset  . Diabetes Mother    Social History:  reports that he quit smoking about 22 months ago. His smoking use included Cigarettes. He has a 21.5 pack-year smoking history. He has never used smokeless tobacco. He reports that he does not drink alcohol or use illicit drugs.  Allergies:  Allergies  Allergen Reactions  . Eggs Or Egg-Derived Products Other (See Comments)    "Cant Digest"    No prescriptions prior to admission    Review of systems otherwise negative  There were no vitals taken for this visit.  Physical exam: Mental status: oriented x3. Eyes: See eye exam associated with this date of surgery in media tab.  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

## 2014-11-09 ENCOUNTER — Encounter (HOSPITAL_COMMUNITY): Payer: Self-pay | Admitting: *Deleted

## 2014-11-09 MED ORDER — CEFAZOLIN SODIUM-DEXTROSE 2-3 GM-% IV SOLR
2.0000 g | INTRAVENOUS | Status: AC
  Administered 2014-11-10: 2 g via INTRAVENOUS
  Filled 2014-11-09: qty 50

## 2014-11-09 NOTE — Progress Notes (Signed)
Anesthesia Chart Review: SAME DAY WORK-UP.   Patient is a [age] year old male posted for right eye pars plana vitrectomy on 11/10/14 by Dr. Ashley RoyaltyMatthews.  History includes smoking, ESRD on hemodialysis, CAD/MI (elevated MB with negative troponin in the setting of ARF/CHF with mild CAD by 2007 cath), GERD, hepatitis C, pancreatitis, DM2 diet controlled, neuropathy, CHF, HTN, cataracts. PCP is listed as Dr. Jeanann Lewandowskylugbemiga Jegede. Cardiologist is Dr. Sharyn LullHarwani, records pending.   Meds include Xanax, amlodipine, ASA, Nephrovite, Coreg, hydralazine, Forsrenol, lisinopril, ProAir.  10/08/14 Nuclear stress test: Study Impression: Myocardial perfusion is normal. The study is normal. This is an intermediate risk study. Overall left ventricular systolic function was normal. LV cavity size is normal. The left ventricular ejection fraction is moderately decreased (30-44%). There is no prior study for comparison.  10/08/14 EKG (in Muse from stress test): SR, first degree AVB, right BBB, LAD, negative T wave in aVL. He had known right BBB and LAD on 12/22/12.   12/24/12 Echo: Study Conclusions - Left ventricle: The cavity size was normal. There was moderate concentric hypertrophy. Systolic function was normal. The estimated ejection fraction was in the range of 60% to 65%. Possible mild hypokinesis of the inferior myocardium. Features are consistent with a pseudonormal left ventricular filling pattern, with concomitant abnormal relaxation and increased filling pressure (grade 2 diastolic dysfunction). - Ventricular septum: The contour showed diastolic flattening and systolic flattening. These changes are consistent with RV volume and pressure overload. - Mitral valve: Calcified annulus. - Left atrium: The atrium was mildly dilated. - Right ventricle: The cavity size was moderately to severely dilated. Wall thickness was normal. Systolic function was mildly reduced. - Right atrium: The atrium was  mildly dilated. - Atrial septum: No defect or patent foramen ovale was identified. - Tricuspid valve: Dilated annulus. Normal thickness leaflets. There was malcoaptation of the valve leaflets. Moderate-severe regurgitation directed centrally. - Pulmonary arteries: Systolic pressure was severely increased. PA peak pressure: 70mm Hg (S). 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  03/13/06 Cardiac Cath: CONCLUSIONS: 1. Systemic hypertension. 2. End-stage renal disease. 3. Presentation with volume overload. 4. Well-preserved left ventricular systolic function. 5. No critical coronary stenosis (RCA minor luminal irregularities, 30% ostial RAMUS INT). Medical therapy recommended.   Patient is for labs on arrival.   I reviewed above with anesthesiologist Dr. Michelle Piperssey. Dr. Annitta JerseyHarwani's records are still pending, but based on currently available records it is anticipated that patient can proceed with this procedure if labs are acceptable and no acute CV/CHF symptoms.   Velna Ochsllison Zelenak, PA-C [facility]Anesthesiology Phone [phone] 11/09/2014 2:30 PM

## 2014-11-09 NOTE — Progress Notes (Signed)
Mr [name] cardiologist is Dr Sharyn LullHarwani.  Patient denies chest pain.  Patient reported that he was suppose to take some papers to Dr Sharyn LullHarwani to fill out, "but hasnt had time". I called Dr Ashley RoyaltyMatthews office and notified that the papers have not been filled out.

## 2014-11-10 ENCOUNTER — Ambulatory Visit (HOSPITAL_COMMUNITY)
Admission: RE | Admit: 2014-11-10 | Discharge: 2014-11-11 | Disposition: A | Discharge status: Home / Self Care | Payer: Medicare Other | Source: Ambulatory Visit | Attending: Ophthalmology | Admitting: Ophthalmology

## 2014-11-10 ENCOUNTER — Encounter (HOSPITAL_COMMUNITY): Payer: Self-pay | Admitting: *Deleted

## 2014-11-10 ENCOUNTER — Encounter (HOSPITAL_COMMUNITY)
Admission: RE | Disposition: A | Discharge status: Home / Self Care | Payer: Self-pay | Source: Ambulatory Visit | Attending: Ophthalmology

## 2014-11-10 ENCOUNTER — Ambulatory Visit (HOSPITAL_COMMUNITY): Payer: Medicare Other | Admitting: Vascular Surgery

## 2014-11-10 DIAGNOSIS — E11359 Type 2 diabetes mellitus with proliferative diabetic retinopathy without macular edema: Secondary | ICD-10-CM | POA: Insufficient documentation

## 2014-11-10 DIAGNOSIS — H4311 Vitreous hemorrhage, right eye: Secondary | ICD-10-CM | POA: Diagnosis present

## 2014-11-10 DIAGNOSIS — Z87891 Personal history of nicotine dependence: Secondary | ICD-10-CM | POA: Insufficient documentation

## 2014-11-10 DIAGNOSIS — E119 Type 2 diabetes mellitus without complications: Secondary | ICD-10-CM | POA: Diagnosis not present

## 2014-11-10 DIAGNOSIS — Z992 Dependence on renal dialysis: Secondary | ICD-10-CM | POA: Insufficient documentation

## 2014-11-10 DIAGNOSIS — I2510 Atherosclerotic heart disease of native coronary artery without angina pectoris: Secondary | ICD-10-CM | POA: Diagnosis not present

## 2014-11-10 DIAGNOSIS — J449 Chronic obstructive pulmonary disease, unspecified: Secondary | ICD-10-CM | POA: Insufficient documentation

## 2014-11-10 DIAGNOSIS — I252 Old myocardial infarction: Secondary | ICD-10-CM | POA: Diagnosis not present

## 2014-11-10 DIAGNOSIS — I120 Hypertensive chronic kidney disease with stage 5 chronic kidney disease or end stage renal disease: Secondary | ICD-10-CM | POA: Insufficient documentation

## 2014-11-10 DIAGNOSIS — H3582 Retinal ischemia: Secondary | ICD-10-CM | POA: Insufficient documentation

## 2014-11-10 DIAGNOSIS — N186 End stage renal disease: Secondary | ICD-10-CM | POA: Insufficient documentation

## 2014-11-10 HISTORY — PX: PARS PLANA VITRECTOMY: SHX2166

## 2014-11-10 HISTORY — PX: MEMBRANE PEEL: SHX5967

## 2014-11-10 HISTORY — PX: GAS/FLUID EXCHANGE: SHX5334

## 2014-11-10 HISTORY — PX: LASER PHOTO ABLATION: SHX5942

## 2014-11-10 LAB — CBC
HEMATOCRIT: 33.2 % — AB (ref 39.0–52.0)
Hemoglobin: 11.1 g/dL — ABNORMAL LOW (ref 13.0–17.0)
MCH: 29.1 pg (ref 26.0–34.0)
MCHC: 33.4 g/dL (ref 30.0–36.0)
MCV: 87.1 fL (ref 78.0–100.0)
Platelets: 94 10*3/uL — ABNORMAL LOW (ref 150–400)
RBC: 3.81 MIL/uL — AB (ref 4.22–5.81)
RDW: 16.4 % — ABNORMAL HIGH (ref 11.5–15.5)
WBC: 6.7 10*3/uL (ref 4.0–10.5)

## 2014-11-10 LAB — COMPREHENSIVE METABOLIC PANEL
ALK PHOS: 170 U/L — AB (ref 38–126)
ALT: 25 U/L (ref 17–63)
AST: 31 U/L (ref 15–41)
Albumin: 2.6 g/dL — ABNORMAL LOW (ref 3.5–5.0)
Anion gap: 10 (ref 5–15)
BUN: 56 mg/dL — ABNORMAL HIGH (ref 6–20)
CHLORIDE: 101 mmol/L (ref 101–111)
CO2: 29 mmol/L (ref 22–32)
Calcium: 8.4 mg/dL — ABNORMAL LOW (ref 8.9–10.3)
Creatinine, Ser: 9.51 mg/dL — ABNORMAL HIGH (ref 0.61–1.24)
GFR calc Af Amer: 6 mL/min — ABNORMAL LOW (ref >60)
GFR calc non Af Amer: 5 mL/min — ABNORMAL LOW (ref >60)
Glucose, Bld: 87 mg/dL (ref 65–99)
Potassium: 4.4 mmol/L (ref 3.5–5.1)
SODIUM: 140 mmol/L (ref 135–145)
Total Bilirubin: 0.5 mg/dL (ref 0.3–1.2)
Total Protein: 6.2 g/dL — ABNORMAL LOW (ref 6.5–8.1)

## 2014-11-10 LAB — GLUCOSE, CAPILLARY
GLUCOSE-CAPILLARY: 79 mg/dL (ref 65–99)
Glucose-Capillary: 80 mg/dL (ref 65–99)
Glucose-Capillary: 82 mg/dL (ref 65–99)

## 2014-11-10 SURGERY — PARS PLANA VITRECTOMY WITH 25 GAUGE
Anesthesia: General | Site: Eye | Laterality: Right

## 2014-11-10 MED ORDER — CISATRACURIUM BESYLATE (PF) 10 MG/5ML IV SOLN
INTRAVENOUS | Status: DC | PRN
  Administered 2014-11-10: 8 mg via INTRAVENOUS

## 2014-11-10 MED ORDER — CYCLOPENTOLATE HCL 1 % OP SOLN
OPHTHALMIC | Status: AC
  Administered 2014-11-10: 1 [drp]
  Filled 2014-11-10: qty 2

## 2014-11-10 MED ORDER — PREDNISOLONE ACETATE 1 % OP SUSP: 1.0000 [drp] | Freq: Four times a day (QID) | OPHTHALMIC | Status: DC

## 2014-11-10 MED ORDER — CYCLOPENTOLATE HCL 1 % OP SOLN
1.0000 [drp] | OPHTHALMIC | Status: DC | PRN
  Administered 2014-11-10 (×2): 1 [drp] via OPHTHALMIC

## 2014-11-10 MED ORDER — LIDOCAINE HCL (CARDIAC) 20 MG/ML IV SOLN
INTRAVENOUS | Status: DC | PRN
  Administered 2014-11-10: 50 mg via INTRATRACHEAL

## 2014-11-10 MED ORDER — FENTANYL CITRATE (PF) 100 MCG/2ML IJ SOLN
INTRAMUSCULAR | Status: DC | PRN
  Administered 2014-11-10: 50 ug via INTRAVENOUS
  Administered 2014-11-10: 100 ug via INTRAVENOUS
  Administered 2014-11-10: 50 ug via INTRAVENOUS

## 2014-11-10 MED ORDER — EPHEDRINE SULFATE 50 MG/ML IJ SOLN
INTRAMUSCULAR | Status: DC | PRN
  Administered 2014-11-10: 10 mg via INTRAVENOUS
  Administered 2014-11-10: 5 mg via INTRAVENOUS

## 2014-11-10 MED ORDER — POLYMYXIN B SULFATE 500000 UNITS IJ SOLR
INTRAMUSCULAR | Status: AC
  Filled 2014-11-10: qty 1

## 2014-11-10 MED ORDER — LANTHANUM CARBONATE 500 MG PO CHEW
500.0000 mg | CHEWABLE_TABLET | Freq: Three times a day (TID) | ORAL | Status: DC
  Administered 2014-11-10: 500 mg via ORAL
  Filled 2014-11-10: qty 1

## 2014-11-10 MED ORDER — BSS IO SOLN
INTRAOCULAR | Status: DC | PRN
  Administered 2014-11-10: 15 mL via INTRAOCULAR

## 2014-11-10 MED ORDER — PHENYLEPHRINE HCL 2.5 % OP SOLN
OPHTHALMIC | Status: AC
  Filled 2014-11-10: qty 2

## 2014-11-10 MED ORDER — ONDANSETRON HCL 4 MG/2ML IJ SOLN
4.0000 mg | Freq: Four times a day (QID) | INTRAMUSCULAR | Status: DC | PRN
  Administered 2014-11-11: 4 mg via INTRAVENOUS
  Filled 2014-11-10: qty 2

## 2014-11-10 MED ORDER — SODIUM CHLORIDE 0.45 % IV SOLN: INTRAVENOUS | Status: DC

## 2014-11-10 MED ORDER — ACETAZOLAMIDE SODIUM 500 MG IJ SOLR
500.0000 mg | Freq: Once | INTRAMUSCULAR | Status: AC
  Administered 2014-11-11: 500 mg via INTRAVENOUS
  Filled 2014-11-10: qty 500

## 2014-11-10 MED ORDER — SODIUM HYALURONATE 10 MG/ML IO SOLN
INTRAOCULAR | Status: DC | PRN
  Administered 2014-11-10: 0.85 mL via INTRAOCULAR

## 2014-11-10 MED ORDER — 0.9 % SODIUM CHLORIDE (POUR BTL) OPTIME
TOPICAL | Status: DC | PRN
  Administered 2014-11-10: 1000 mL

## 2014-11-10 MED ORDER — MEPERIDINE HCL 25 MG/ML IJ SOLN: 6.2500 mg | INTRAMUSCULAR | Status: DC | PRN

## 2014-11-10 MED ORDER — BUPIVACAINE HCL (PF) 0.75 % IJ SOLN
INTRAMUSCULAR | Status: AC
  Filled 2014-11-10: qty 10

## 2014-11-10 MED ORDER — FENTANYL CITRATE (PF) 250 MCG/5ML IJ SOLN
INTRAMUSCULAR | Status: AC
  Filled 2014-11-10: qty 5

## 2014-11-10 MED ORDER — BUPIVACAINE HCL (PF) 0.75 % IJ SOLN
INTRAMUSCULAR | Status: DC | PRN
  Administered 2014-11-10: 10 mL

## 2014-11-10 MED ORDER — PHENYLEPHRINE HCL 2.5 % OP SOLN
1.0000 [drp] | OPHTHALMIC | Status: AC | PRN
  Administered 2014-11-10 (×3): 1 [drp] via OPHTHALMIC

## 2014-11-10 MED ORDER — GATIFLOXACIN 0.5 % OP SOLN
OPHTHALMIC | Status: AC
  Administered 2014-11-10: 1 [drp]
  Filled 2014-11-10: qty 2.5

## 2014-11-10 MED ORDER — PROPOFOL 10 MG/ML IV BOLUS
INTRAVENOUS | Status: DC | PRN
  Administered 2014-11-10: 140 mg via INTRAVENOUS

## 2014-11-10 MED ORDER — ALBUTEROL SULFATE (2.5 MG/3ML) 0.083% IN NEBU: 2.5000 mg | INHALATION_SOLUTION | RESPIRATORY_TRACT | Status: DC | PRN

## 2014-11-10 MED ORDER — PROMETHAZINE HCL 25 MG/ML IJ SOLN: 6.2500 mg | INTRAMUSCULAR | Status: DC | PRN

## 2014-11-10 MED ORDER — CARVEDILOL 25 MG PO TABS
25.0000 mg | ORAL_TABLET | Freq: Two times a day (BID) | ORAL | Status: DC
  Administered 2014-11-10: 25 mg via ORAL
  Filled 2014-11-10: qty 1

## 2014-11-10 MED ORDER — GENTAMICIN SULFATE 40 MG/ML IJ SOLN
INTRAMUSCULAR | Status: AC
  Filled 2014-11-10: qty 2

## 2014-11-10 MED ORDER — EPINEPHRINE HCL 1 MG/ML IJ SOLN
INTRAOCULAR | Status: DC | PRN
  Administered 2014-11-10: 14:00:00

## 2014-11-10 MED ORDER — HYALURONIDASE HUMAN 150 UNIT/ML IJ SOLN
INTRAMUSCULAR | Status: AC
  Filled 2014-11-10: qty 1

## 2014-11-10 MED ORDER — SODIUM CHLORIDE 0.9 % IJ SOLN
INTRAMUSCULAR | Status: AC
  Filled 2014-11-10: qty 10

## 2014-11-10 MED ORDER — PREDNISOLONE ACETATE 1 % OP SUSP
1.0000 [drp] | Freq: Four times a day (QID) | OPHTHALMIC | Status: DC
  Filled 2014-11-10: qty 5

## 2014-11-10 MED ORDER — SODIUM HYALURONATE 10 MG/ML IO SOLN
INTRAOCULAR | Status: AC
  Filled 2014-11-10: qty 0.85

## 2014-11-10 MED ORDER — EPINEPHRINE HCL 1 MG/ML IJ SOLN
INTRAMUSCULAR | Status: AC
  Filled 2014-11-10: qty 1

## 2014-11-10 MED ORDER — SODIUM CHLORIDE 0.9 % IJ SOLN
INTRAMUSCULAR | Status: DC | PRN
  Administered 2014-11-10: 14:00:00

## 2014-11-10 MED ORDER — MAGNESIUM HYDROXIDE 400 MG/5ML PO SUSP
15.0000 mL | Freq: Four times a day (QID) | ORAL | Status: DC | PRN
Start: 2014-11-10 — End: 2014-11-11

## 2014-11-10 MED ORDER — ALBUTEROL SULFATE HFA 108 (90 BASE) MCG/ACT IN AERS: 1.0000 | INHALATION_SPRAY | RESPIRATORY_TRACT | Status: DC | PRN

## 2014-11-10 MED ORDER — LISINOPRIL 40 MG PO TABS
40.0000 mg | ORAL_TABLET | Freq: Every day | ORAL | Status: DC
  Administered 2014-11-10: 40 mg via ORAL
  Filled 2014-11-10: qty 1

## 2014-11-10 MED ORDER — ATROPINE SULFATE 1 % OP SOLN
OPHTHALMIC | Status: AC
  Filled 2014-11-10: qty 5

## 2014-11-10 MED ORDER — GATIFLOXACIN 0.5 % OP SOLN
1.0000 [drp] | OPHTHALMIC | Status: DC | PRN
  Administered 2014-11-10 (×2): 1 [drp] via OPHTHALMIC

## 2014-11-10 MED ORDER — NEOSTIGMINE METHYLSULFATE 10 MG/10ML IV SOLN
INTRAVENOUS | Status: DC | PRN
  Administered 2014-11-10: 3 mg via INTRAVENOUS

## 2014-11-10 MED ORDER — TROPICAMIDE 1 % OP SOLN
1.0000 [drp] | OPHTHALMIC | Status: AC | PRN
  Administered 2014-11-10 (×3): 1 [drp] via OPHTHALMIC

## 2014-11-10 MED ORDER — TROPICAMIDE 1 % OP SOLN
OPHTHALMIC | Status: AC
Start: 2014-11-10 — End: 2014-11-10
  Filled 2014-11-10: qty 3

## 2014-11-10 MED ORDER — BACITRACIN-POLYMYXIN B 500-10000 UNIT/GM OP OINT
TOPICAL_OINTMENT | OPHTHALMIC | Status: DC | PRN
  Administered 2014-11-10: 1 via OPHTHALMIC

## 2014-11-10 MED ORDER — BRIMONIDINE TARTRATE 0.2 % OP SOLN
1.0000 [drp] | Freq: Two times a day (BID) | OPHTHALMIC | Status: DC
  Filled 2014-11-10: qty 5

## 2014-11-10 MED ORDER — ACETAMINOPHEN 325 MG PO TABS: 325.0000 mg | ORAL_TABLET | ORAL | Status: DC | PRN

## 2014-11-10 MED ORDER — DEXAMETHASONE SODIUM PHOSPHATE 10 MG/ML IJ SOLN
INTRAMUSCULAR | Status: AC
  Filled 2014-11-10: qty 1

## 2014-11-10 MED ORDER — FENTANYL CITRATE (PF) 100 MCG/2ML IJ SOLN
25.0000 ug | INTRAMUSCULAR | Status: DC | PRN
  Administered 2014-11-10: 25 ug via INTRAVENOUS

## 2014-11-10 MED ORDER — TETRACAINE HCL 0.5 % OP SOLN
2.0000 [drp] | Freq: Once | OPHTHALMIC | Status: DC
  Filled 2014-11-10: qty 2

## 2014-11-10 MED ORDER — LATANOPROST 0.005 % OP SOLN
1.0000 [drp] | Freq: Every day | OPHTHALMIC | Status: DC
  Filled 2014-11-10 (×2): qty 2.5

## 2014-11-10 MED ORDER — BACITRACIN-POLYMYXIN B 500-10000 UNIT/GM OP OINT
1.0000 "application " | TOPICAL_OINTMENT | Freq: Three times a day (TID) | OPHTHALMIC | Status: DC
  Filled 2014-11-10 (×2): qty 3.5

## 2014-11-10 MED ORDER — HYDRALAZINE HCL 50 MG PO TABS
50.0000 mg | ORAL_TABLET | Freq: Three times a day (TID) | ORAL | Status: DC
  Administered 2014-11-10 (×2): 50 mg via ORAL
  Filled 2014-11-10 (×2): qty 1

## 2014-11-10 MED ORDER — ONDANSETRON HCL 4 MG/2ML IJ SOLN
INTRAMUSCULAR | Status: DC | PRN
  Administered 2014-11-10: 4 mg via INTRAVENOUS

## 2014-11-10 MED ORDER — BACITRACIN-POLYMYXIN B 500-10000 UNIT/GM OP OINT
TOPICAL_OINTMENT | OPHTHALMIC | Status: AC
  Filled 2014-11-10: qty 3.5

## 2014-11-10 MED ORDER — GATIFLOXACIN 0.5 % OP SOLN
1.0000 [drp] | Freq: Four times a day (QID) | OPHTHALMIC | Status: DC
  Filled 2014-11-10 (×2): qty 2.5

## 2014-11-10 MED ORDER — GLYCOPYRROLATE 0.2 MG/ML IJ SOLN
INTRAMUSCULAR | Status: DC | PRN
  Administered 2014-11-10: 0.4 mg via INTRAVENOUS

## 2014-11-10 MED ORDER — AMLODIPINE BESYLATE 10 MG PO TABS
10.0000 mg | ORAL_TABLET | Freq: Every day | ORAL | Status: DC
  Administered 2014-11-10: 10 mg via ORAL
  Filled 2014-11-10: qty 1

## 2014-11-10 MED ORDER — ATROPINE SULFATE 1 % OP SOLN
OPHTHALMIC | Status: DC | PRN
  Administered 2014-11-10: 1 [drp] via OPHTHALMIC

## 2014-11-10 MED ORDER — DEXAMETHASONE SODIUM PHOSPHATE 10 MG/ML IJ SOLN
INTRAMUSCULAR | Status: DC | PRN
  Administered 2014-11-10: 10 mg via INTRAVENOUS

## 2014-11-10 MED ORDER — HYDROCODONE-ACETAMINOPHEN 5-325 MG PO TABS
1.0000 | ORAL_TABLET | ORAL | Status: DC | PRN
  Administered 2014-11-10: 2 via ORAL
  Filled 2014-11-10 (×2): qty 2

## 2014-11-10 MED ORDER — BSS PLUS IO SOLN
INTRAOCULAR | Status: AC
  Filled 2014-11-10: qty 500

## 2014-11-10 MED ORDER — MORPHINE SULFATE 2 MG/ML IJ SOLN
1.0000 mg | INTRAMUSCULAR | Status: AC | PRN
  Administered 2014-11-10 (×2): 2 mg via INTRAVENOUS
  Filled 2014-11-10 (×2): qty 1

## 2014-11-10 MED ORDER — TEMAZEPAM 15 MG PO CAPS: 15.0000 mg | ORAL_CAPSULE | Freq: Every evening | ORAL | Status: DC | PRN

## 2014-11-10 MED ORDER — SODIUM CHLORIDE 0.9 % IV SOLN
INTRAVENOUS | Status: DC
  Administered 2014-11-10 (×2): via INTRAVENOUS

## 2014-11-10 MED ORDER — PROPOFOL 10 MG/ML IV BOLUS
INTRAVENOUS | Status: AC
  Filled 2014-11-10: qty 20

## 2014-11-10 MED ORDER — FENTANYL CITRATE (PF) 100 MCG/2ML IJ SOLN
INTRAMUSCULAR | Status: AC
  Filled 2014-11-10: qty 2

## 2014-11-10 SURGICAL SUPPLY — 41 items
BALL CTTN LRG ABS STRL LF (GAUZE/BANDAGES/DRESSINGS) ×3
CANNULA TROCAR 25 GA VLV (OPHTHALMIC) ×3 IMPLANT
CANNULA TROCAR 25G 4 VLV (OPHTHALMIC) IMPLANT
CANNULA VLV SOFT TIP 25G (OPHTHALMIC) ×1 IMPLANT
CANNULA VLV SOFT TIP 25GA (OPHTHALMIC) ×3 IMPLANT
COTTONBALL LRG STERILE PKG (GAUZE/BANDAGES/DRESSINGS) ×9 IMPLANT
COVER MAYO STAND STRL (DRAPES) ×2 IMPLANT
DRAPE OPHTHALMIC 77X100 STRL (CUSTOM PROCEDURE TRAY) ×3 IMPLANT
GLOVE SS BIOGEL STRL SZ 6.5 (GLOVE) ×1 IMPLANT
GLOVE SS BIOGEL STRL SZ 7 (GLOVE) ×1 IMPLANT
GLOVE SUPERSENSE BIOGEL SZ 6.5 (GLOVE) ×2
GLOVE SUPERSENSE BIOGEL SZ 7 (GLOVE) ×2
GLOVE SURG 8.5 LATEX PF (GLOVE) ×3 IMPLANT
GOWN STRL REUS W/ TWL LRG LVL3 (GOWN DISPOSABLE) ×3 IMPLANT
GOWN STRL REUS W/TWL LRG LVL3 (GOWN DISPOSABLE) ×9
KIT BASIN OR (CUSTOM PROCEDURE TRAY) ×3 IMPLANT
NDL 18GX1X1/2 (RX/OR ONLY) (NEEDLE) ×1 IMPLANT
NDL 25GX 5/8IN NON SAFETY (NEEDLE) ×1 IMPLANT
NDL FILTER BLUNT 18X1 1/2 (NEEDLE) ×1 IMPLANT
NDL HYPO 30X.5 LL (NEEDLE) IMPLANT
NEEDLE 18GX1X1/2 (RX/OR ONLY) (NEEDLE) ×3 IMPLANT
NEEDLE 25GX 5/8IN NON SAFETY (NEEDLE) ×3 IMPLANT
NEEDLE FILTER BLUNT 18X 1/2SAF (NEEDLE) ×2
NEEDLE FILTER BLUNT 18X1 1/2 (NEEDLE) ×1 IMPLANT
NEEDLE HYPO 30X.5 LL (NEEDLE) ×6 IMPLANT
NS IRRIG 1000ML POUR BTL (IV SOLUTION) ×3 IMPLANT
PACK VITRECTOMY CUSTOM (CUSTOM PROCEDURE TRAY) ×3 IMPLANT
PAD ARMBOARD 7.5X6 YLW CONV (MISCELLANEOUS) ×6 IMPLANT
PAK PIK VITRECTOMY CVS 25GA (OPHTHALMIC) ×3 IMPLANT
PIC ILLUMINATED 25G (OPHTHALMIC) ×3
PIK ILLUMINATED 25G (OPHTHALMIC) ×1 IMPLANT
PROBE LASER ILLUM FLEX CVD 25G (OPHTHALMIC) ×2 IMPLANT
ROLLS DENTAL (MISCELLANEOUS) ×6 IMPLANT
SPONGE SURGIFOAM ABS GEL 12-7 (HEMOSTASIS) ×3 IMPLANT
SYR 20CC LL (SYRINGE) ×3 IMPLANT
SYR BULB 3OZ (MISCELLANEOUS) ×3 IMPLANT
SYR TB 1ML LUER SLIP (SYRINGE) ×3 IMPLANT
TOWEL OR 17X24 6PK STRL BLUE (TOWEL DISPOSABLE) ×3 IMPLANT
TUBING HIGH PRESS EXTEN 6IN (TUBING) IMPLANT
WATER STERILE IRR 1000ML POUR (IV SOLUTION) ×3 IMPLANT
WIPE INSTRUMENT VISIWIPE 73X73 (MISCELLANEOUS) ×2 IMPLANT

## 2014-11-10 NOTE — Anesthesia Procedure Notes (Signed)
Procedure Name: Intubation Date/Time: 11/10/2014 1:59 PM Performed by: Shirlyn Goltz Pre-anesthesia Checklist: Patient identified, Emergency Drugs available, Suction available and Patient being monitored Patient Re-evaluated:Patient Re-evaluated prior to inductionOxygen Delivery Method: Circle system utilized Preoxygenation: Pre-oxygenation with 100% oxygen Intubation Type: IV induction Ventilation: Mask ventilation without difficulty Laryngoscope Size: Mac and 4 Grade View: Grade II Tube type: Oral Tube size: 7.0 mm Number of attempts: 1 Airway Equipment and Method: Stylet and LTA kit utilized Placement Confirmation: ETT inserted through vocal cords under direct vision,  positive ETCO2 and breath sounds checked- equal and bilateral Secured at: 22 cm Tube secured with: Tape Dental Injury: Teeth and Oropharynx as per pre-operative assessment

## 2014-11-10 NOTE — Transfer of Care (Signed)
Immediate Anesthesia Transfer of Care Note  Patient: [name]  Procedure(s) Performed: Procedure(s) with comments: PARS PLANA VITRECTOMY WITH 25 GAUGE (Right) MEMBRANE PEEL (Right) GAS/FLUID EXCHANGE (Right) LASER PHOTO ABLATION (Right) - Endo Laser  Patient Location: PACU  Anesthesia Type:General  Level of Consciousness: awake, alert , oriented and patient cooperative  Airway & Oxygen Therapy: Patient Spontanous Breathing and Patient connected to nasal cannula oxygen  Post-op Assessment: Report given to RN, Post -op Vital signs reviewed and stable, Patient moving all extremities and Patient moving all extremities X 4  Post vital signs: Reviewed and stable  Last Vitals:  Filed Vitals:   11/10/14 1145  BP: 160/68  Pulse:   Temp:     Complications: No apparent anesthesia complications

## 2014-11-10 NOTE — Brief Op Note (Signed)
Brief Operative note   Preoperative diagnosis:  vitreous hemmorrhage right eye Postoperative diagnosis  * No Diagnosis Codes entered *  Procedures: Pars plana vitrectomy, laser, gas injection. Right eye  Surgeon:  Sherrie GeorgeJohn D Ajay Strubel, MD...  Assistant:  Rosalie DoctorLisa Johnson SA    Anesthesia: General  Specimen: none  Estimated blood loss:  1cc  Complications: none  Patient sent to PACU in good condition  Composed by Sherrie GeorgeJohn D Constant Mand[place] MD  Dictation number: [id number]

## 2014-11-10 NOTE — H&P (Signed)
I examined the patient today and there is no change in the medical status 

## 2014-11-10 NOTE — Anesthesia Preprocedure Evaluation (Addendum)
Anesthesia Evaluation  Patient identified by MRN, date of birth, ID band Patient awake    Reviewed: Allergy & Precautions, H&P , NPO status , Patient's Chart, lab work & pertinent test results, reviewed documented beta blocker date and time   Airway Mallampati: I  TM Distance: >3 FB Neck ROM: Full    Dental  (+) Teeth Intact, Dental Advidsory Given   Pulmonary shortness of breath, COPDCurrent Smoker,  breath sounds clear to auscultation        Cardiovascular hypertension, Pt. on home beta blockers and Pt. on medications + CAD, + Past MI and +CHF Rhythm:Regular Rate:Normal  NM stress 10/08/2014 The study is normal. This is an intermediate risk study. The left ventricular ejection fraction is moderately decreased (30-44%).   Neuro/Psych    GI/Hepatic GERD-  ,(+) Hepatitis -, C  Endo/Other  diabetes  Renal/GU Renal disease     Musculoskeletal   Abdominal   Peds  Hematology   Anesthesia Other Findings   Reproductive/Obstetrics                           Anesthesia Physical  Anesthesia Plan  ASA: III  Anesthesia Plan: General   Post-op Pain Management:    Induction: Intravenous  Airway Management Planned: Oral ETT  Additional Equipment:   Intra-op Plan:   Post-operative Plan: Extubation in OR  Informed Consent: I have reviewed the patients History and Physical, chart, labs and discussed the procedure including the risks, benefits and alternatives for the proposed anesthesia with the patient or authorized representative who has indicated his/her understanding and acceptance.   Dental advisory given  Plan Discussed with: CRNA  Anesthesia Plan Comments:        Anesthesia Quick Evaluation

## 2014-11-11 ENCOUNTER — Encounter (HOSPITAL_COMMUNITY): Payer: Self-pay | Admitting: Ophthalmology

## 2014-11-11 DIAGNOSIS — E11359 Type 2 diabetes mellitus with proliferative diabetic retinopathy without macular edema: Secondary | ICD-10-CM | POA: Diagnosis not present

## 2014-11-11 MED ORDER — PREDNISOLONE ACETATE 1 % OP SUSP: 1.0000 [drp] | Freq: Four times a day (QID) | OPHTHALMIC | Status: DC

## 2014-11-11 MED ORDER — GATIFLOXACIN 0.5 % OP SOLN: 1.0000 [drp] | Freq: Four times a day (QID) | OPHTHALMIC | Status: DC

## 2014-11-11 MED ORDER — BACITRACIN-POLYMYXIN B 500-10000 UNIT/GM OP OINT: 1.0000 "application " | TOPICAL_OINTMENT | Freq: Three times a day (TID) | OPHTHALMIC | Status: DC

## 2014-11-11 NOTE — Discharge Summary (Signed)
Discharge summary not needed on [facility] patients per medical records. 

## 2014-11-11 NOTE — Op Note (Signed)
[name]:  [name], [name]              ACCOUNT NO.:  [id number]  MEDICAL RECORD NO.:  [id number]  LOCATION:  6N05C                        FACILITY:  [facility]  PHYSICIAN:  Beulah GandyJohn D. Ashley RoyaltyMatthews, M.D. DATE OF BIRTH:  [birth year]  DATE OF PROCEDURE:  11/10/2014 DATE OF DISCHARGE:                              OPERATIVE REPORT   ADMISSION DIAGNOSES:  Proliferative diabetic retinopathy, vitreous hemorrhage, retinal ischemia in the right eye.  PROCEDURES:  Pars plana vitrectomy, laser photocoagulation, gas-fluid exchange in the right eye.  SURGEON:  Beulah GandyJohn D. Ashley RoyaltyMatthews, M.D.  ASSISTANT:  Rosalie DoctorLisa Johnson, SA.  ANESTHESIA:  General.  DETAILS:  Usual prep and drape, 25-gauge trocar was placed at 8,10 and 2 o'clock, infusion at 8 o'clock.  Pars plana vitrectomy was begun just behind the cataractous lens.  Provisc was placed on the corneal surface and the BIOM viewing system was moved into place.  Dense white and dense dark red vitreous hemorrhage was encountered, this was carefully removed under low suction and rapid cutting.  The central core vitrectomy was performed, going through multiple layers of white vitreous and red vitreous down to the macular surface where blood was located coating the macular surface.  The vitrectomy and extrusion were used to remove blood from the macular surface.  The vitrectomy was carried into the midperiphery where additional blood and vitreous were encountered, this was carefully removed under low suction and rapid cutting.  The blood vessels of the retina became visible and they were very thin with a large amount of pigmentation in the retina.  No previous laser scars, but just retinal pigment and pigment lying on the retina itself.  The retina was thin and gray, and seemed to be ischemic.  The vitrectomy was carried into far periphery where scleral depression was used to gain access to the vitreous base.  Dense red blood was then removed from these areas for 360  degrees.  A curtain of old white blood was encountered and then removed with the vitreous cutter.  The cutter was placed within several trocars to gain access to the peripheral vitreous base areas.  Once the entire vitreous cavity was cleaned, the endolaser was positioned in the eye, 1052 burns were placed around the retinal periphery.  The power was 400 mW, 1000 microns each and 0.1 seconds each.  A total gas-fluid exchange was carried out with the extrusion tip.  Once the entire vitreous cavity was filled with gas, the instruments were removed from the eye.  The wounds were tested and found to be secure.  Polymyxin and gentamicin were irrigated into tenon space. Atropine solution was applied.  Marcaine was injected around the globe for postop pain.  Decadron 10 mg was injected into the lower subconjunctival space.  Polysporin ophthalmic ointment, a patch and shield were placed.  Closing pressure was 10 with a Barraquer tonometer.  COMPLICATIONS:  None.  DURATION:  1 hour and 30 minutes.     Beulah GandyJohn D. Ashley RoyaltyMatthews, M.D.     JDM/MEDQ  D:  11/10/2014  T:  11/11/2014  Job:  [id number]

## 2014-11-11 NOTE — Progress Notes (Signed)
[id number] Patient discharge to home. Patient given discharge instructions and verbalize the understanding of the instructions.

## 2014-11-11 NOTE — Progress Notes (Signed)
11/11/2014, 6:28 AM  Mental Status:  Awake, Alert, Oriented  Anterior segment: Cornea  Clear    Anterior Chamber Clear    Lens:   Cataract  Intra Ocular Pressure 14 mmHg with Tonopen  Vitreous: Clear 85%gas bubble   Retina:  Attached Good laser reaction   Hazy view  Impression: Excellent result   Final Diagnosis: Active Problems:   Vitreous hemorrhage, right eye   Vitreous hemorrhage of right eye   Plan: start post operative eye drops.  Discharge to home.  Give post operative instructions  Sherrie GeorgeMATTHEWS, JOHN D 11/11/2014, 6:28 AM

## 2014-11-12 NOTE — Anesthesia Postprocedure Evaluation (Signed)
Anesthesia Post Note  Patient: [name]  Procedure(s) Performed: Procedure(s) (LRB): PARS PLANA VITRECTOMY WITH 25 GAUGE (Right) MEMBRANE PEEL (Right) GAS/FLUID EXCHANGE (Right) LASER PHOTO ABLATION (Right)  Anesthesia type: General  Patient location: PACU  Post pain: Pain level controlled  Post assessment: Post-op Vital signs reviewed  Last Vitals: BP 141/75 mmHg  Pulse 57  Temp(Src) 37.2 C (Oral)  Resp 16  Ht 5\' 10"  (1.778 m)  Wt 175 lb (79.379 kg)  BMI 25.11 kg/m2  SpO2 99%  Post vital signs: Reviewed  Level of consciousness: sedated  Complications: No apparent anesthesia complications

## 2014-11-15 ENCOUNTER — Encounter (HOSPITAL_COMMUNITY): Payer: Self-pay | Admitting: Nurse Practitioner

## 2014-11-15 ENCOUNTER — Emergency Department (HOSPITAL_COMMUNITY)
Admission: EM | Admit: 2014-11-15 | Discharge: 2014-11-15 | Disposition: A | Discharge status: Home / Self Care | Payer: Medicare Other | Attending: Emergency Medicine | Admitting: Emergency Medicine

## 2014-11-15 DIAGNOSIS — Z8619 Personal history of other infectious and parasitic diseases: Secondary | ICD-10-CM | POA: Diagnosis not present

## 2014-11-15 DIAGNOSIS — Z79899 Other long term (current) drug therapy: Secondary | ICD-10-CM | POA: Insufficient documentation

## 2014-11-15 DIAGNOSIS — H269 Unspecified cataract: Secondary | ICD-10-CM | POA: Insufficient documentation

## 2014-11-15 DIAGNOSIS — N186 End stage renal disease: Secondary | ICD-10-CM | POA: Insufficient documentation

## 2014-11-15 DIAGNOSIS — Z720 Tobacco use: Secondary | ICD-10-CM | POA: Diagnosis not present

## 2014-11-15 DIAGNOSIS — M79671 Pain in right foot: Secondary | ICD-10-CM | POA: Insufficient documentation

## 2014-11-15 DIAGNOSIS — Z7952 Long term (current) use of systemic steroids: Secondary | ICD-10-CM | POA: Insufficient documentation

## 2014-11-15 DIAGNOSIS — I120 Hypertensive chronic kidney disease with stage 5 chronic kidney disease or end stage renal disease: Secondary | ICD-10-CM | POA: Insufficient documentation

## 2014-11-15 DIAGNOSIS — I252 Old myocardial infarction: Secondary | ICD-10-CM | POA: Diagnosis not present

## 2014-11-15 DIAGNOSIS — E119 Type 2 diabetes mellitus without complications: Secondary | ICD-10-CM | POA: Diagnosis not present

## 2014-11-15 DIAGNOSIS — I509 Heart failure, unspecified: Secondary | ICD-10-CM | POA: Diagnosis not present

## 2014-11-15 DIAGNOSIS — Z992 Dependence on renal dialysis: Secondary | ICD-10-CM | POA: Insufficient documentation

## 2014-11-15 DIAGNOSIS — Z8719 Personal history of other diseases of the digestive system: Secondary | ICD-10-CM | POA: Insufficient documentation

## 2014-11-15 DIAGNOSIS — Z7982 Long term (current) use of aspirin: Secondary | ICD-10-CM | POA: Diagnosis not present

## 2014-11-15 MED ORDER — PREDNISONE 20 MG PO TABS
60.0000 mg | ORAL_TABLET | Freq: Once | ORAL | Status: AC
  Administered 2014-11-15: 60 mg via ORAL
  Filled 2014-11-15: qty 3

## 2014-11-15 MED ORDER — HYDROCODONE-ACETAMINOPHEN 5-325 MG PO TABS
2.0000 | ORAL_TABLET | Freq: Once | ORAL | Status: AC
  Administered 2014-11-15: 1 via ORAL
  Filled 2014-11-15: qty 2

## 2014-11-15 MED ORDER — HYDROCODONE-ACETAMINOPHEN 5-325 MG PO TABS: 2.0000 | ORAL_TABLET | ORAL | Status: DC | PRN

## 2014-11-15 MED ORDER — PREDNISONE 20 MG PO TABS: 40.0000 mg | ORAL_TABLET | Freq: Every day | ORAL | Status: DC

## 2014-11-15 NOTE — Discharge Instructions (Signed)

## 2014-11-15 NOTE — ED Provider Notes (Signed)
CSN: [id number]     Arrival date & time 11/15/14  1814 History  This chart was scribed for non-physician practitioner, Danelle Berry, PA-C, working with Tilden Fossa, MD, by Ronney Lion, ED Scribe. This patient was seen in room TR06C/TR06C and the patient's care was started at 7:00 PM.    Chief Complaint  Patient presents with  . Foot Pain   The history is provided by the patient. No language interpreter was used.    HPI Comments: [name] is a 59 y.o. male with a history of ESRD and currently undergoing hemodialysis MWF, who presents to the Emergency Department complaining of gradual onset, sharp right foot pain over the fifth metatarsal that began last night when he was lying down. He denies any known trauma or injury, or any recent changes in activity.  Taking weight off the foot exacerbates his pain; bearing weight alleviates it. The pain is sharp and intermittent with no other aggravating or relieving factors. An electrical burning sensation will shoot from his toes up his foot, without any warning, and without movement of his foot or toes. The pain is more severe than it was a day ago. He reports taking daily hypertension medication and vitamins.   Past Medical History  Diagnosis Date  . Myocardial infarction   . Hypertension   . GERD (gastroesophageal reflux disease)   . Pulmonary edema     Just prior to starting HD and resolved with HD. ECHO in 2008 showed normal LVEF, mild diiastolic dysfunction.   . Neuropathy     Hx: of in toes  . Hepatitis     Hc: of Hep C  . Cataracts, both eyes     Hx: of "slightly"  . Pancreatitis     Hx: of  . CHF (congestive heart failure)   . Diabetes mellitus     not on medication  . 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  Started HD in 2007, gets HD at [facility] on MWF schedule.     Past Surgical History  Procedure Laterality Date  . Dialysis fistula creation Left   . Cholecystectomy    . Colonoscopy       Hx: of  . Ligation of arteriovenous  fistula Left 11/06/2012    Procedure: PLICATION OF BRACHIO-CEPHALIC ARTERIOVENOUS  FISTULA;  Surgeon: Fransisco Hertz, MD;  Location: [facility] OR;  Service: Vascular;  Laterality: Left;  . Pars plana vitrectomy Right 11/10/2014    Procedure: PARS PLANA VITRECTOMY WITH 25 GAUGE;  Surgeon: Sherrie George, MD;  Location: [facility] OR;  Service: Ophthalmology;  Laterality: Right;  . Membrane peel Right 11/10/2014    Procedure: MEMBRANE PEEL;  Surgeon: Sherrie George, MD;  Location: [facility] OR;  Service: Ophthalmology;  Laterality: Right;  . Gas/fluid exchange Right 11/10/2014    Procedure: GAS/FLUID EXCHANGE;  Surgeon: Sherrie George, MD;  Location: [facility] OR;  Service: Ophthalmology;  Laterality: Right;  . Laser photo ablation Right 11/10/2014    Procedure: LASER PHOTO ABLATION;  Surgeon: Sherrie George, MD;  Location: [facility] OR;  Service: Ophthalmology;  Laterality: Right;  Endo Laser   Family History  Problem Relation Age of Onset  . Diabetes Mother    History  Substance Use Topics  . Smoking status: Current Every Day Smoker -- 0.35 packs/day for 43 years    Types: Cigarettes    Last Attempt to Quit: 12/16/2012  . Smokeless tobacco: Never Used  . Alcohol  Use: No    Review of Systems  Constitutional: Negative for fever, chills, diaphoresis and fatigue.  HENT: Negative.   Eyes: Negative.   Respiratory: Negative for shortness of breath.   Cardiovascular: Negative for chest pain, palpitations and leg swelling.  Gastrointestinal: Negative.  Negative for nausea, vomiting, abdominal pain and diarrhea.  Genitourinary: Negative.   Musculoskeletal: Negative for myalgias (right foot pain) and gait problem.  Skin: Negative.   Neurological: Negative for weakness and numbness.  Psychiatric/Behavioral: Negative.    Allergies  Eggs or egg-derived products  Home Medications   Prior to Admission medications   Medication Sig Start Date End Date Taking? Authorizing Provider  amLODipine  (NORVASC) 10 MG tablet Take 10 mg by mouth at bedtime.    Historical Provider, MD  aspirin 81 MG tablet Take 81 mg by mouth daily.    Historical Provider, MD  b complex-vitamin c-folic acid (NEPHRO-VITE) 0.8 MG TABS Take 0.8 mg by mouth at bedtime.    Historical Provider, MD  bacitracin-polymyxin b (POLYSPORIN) ophthalmic ointment Place 1 application into the right eye 3 (three) times daily. apply to eye every 8 hours while awake 11/11/14   Sherrie GeorgeJohn D Matthews, MD  benzonatate (TESSALON) 100 MG capsule Take 2 capsules (200 mg total) by mouth 2 (two) times daily as needed for cough. Patient not taking: Reported on 11/10/2014 10/20/13   Antony MaduraKelly Humes, PA-C  carvedilol (COREG) 25 MG tablet Take 25 mg by mouth 2 (two) times daily with a meal.    Historical Provider, MD  doxycycline (VIBRAMYCIN) 100 MG capsule Take 1 capsule (100 mg total) by mouth 2 (two) times daily. Patient not taking: Reported on 09/29/2014 10/20/13   Antony MaduraKelly Humes, PA-C  gatifloxacin (ZYMAXID) 0.5 % SOLN Place 1 drop into the right eye 4 (four) times daily. 11/11/14   Sherrie GeorgeJohn D Matthews, MD  hydrALAZINE (APRESOLINE) 50 MG tablet Take 50 mg by mouth 3 (three) times daily.    Historical Provider, MD  lanthanum (FOSRENOL) 500 MG chewable tablet Chew 500 mg by mouth 3 (three) times daily with meals.    Historical Provider, MD  levofloxacin (LEVAQUIN) 500 MG tablet Take 1 tablet (500 mg total) by mouth every other day. For 3 doses Patient not taking: Reported on 11/10/2014 10/05/14   Junius FinnerErin O'Malley, PA-C  lisinopril (PRINIVIL,ZESTRIL) 40 MG tablet Take 40 mg by mouth daily.    Historical Provider, MD  metroNIDAZOLE (FLAGYL) 500 MG tablet Take 1 tablet (500 mg total) by mouth 2 (two) times daily. One po bid x 7 days Patient not taking: Reported on 11/10/2014 10/05/14   Junius FinnerErin O'Malley, PA-C  ondansetron (ZOFRAN) 4 MG tablet Take 1 tablet (4 mg total) by mouth every 6 (six) hours. Patient not taking: Reported on 11/10/2014 10/05/14   Junius FinnerErin O'Malley, PA-C   prednisoLONE acetate (PRED FORTE) 1 % ophthalmic suspension Place 1 drop into the right eye 4 (four) times daily. 11/11/14   Sherrie GeorgeJohn D Matthews, MD  predniSONE (DELTASONE) 50 MG tablet Take 1 tablet (50 mg total) by mouth daily. 09/29/14   Linwood DibblesJon Knapp, MD  PROAIR HFA 108 (90 BASE) MCG/ACT inhaler Inhale 1 puff into the lungs every 4 (four) hours as needed for wheezing. Wheezing 09/22/14   Historical Provider, MD   Ht 5\' 10"  (1.778 m)  Wt 176 lb 8 oz (80.06 kg)  BMI 25.33 kg/m2 Physical Exam  Constitutional: He is oriented to person, place, and time. He appears well-developed and well-nourished. No distress.  HENT:  Head: Normocephalic and atraumatic.  Right Ear: External ear normal.  Left Ear: External ear normal.  Nose: Nose normal.  Eyes: Conjunctivae and EOM are normal. Pupils are equal, round, and reactive to light. Right eye exhibits no discharge. Left eye exhibits no discharge. No scleral icterus.  Neck: Normal range of motion. Neck supple. No tracheal deviation present.  Cardiovascular: Normal rate and regular rhythm.  Exam reveals no gallop and no friction rub.   No murmur heard. Pulmonary/Chest: Effort normal. No respiratory distress. He has no wheezes. He has no rales. He exhibits no tenderness.  Musculoskeletal: Normal range of motion. He exhibits no edema or tenderness.  DP and PT pulses 2+. Sensation intact throughout lower extremities and in all toes ,Cap refill <2 seconds. No erythema, induration, drainage. Normal ROM without pain at the right ankle joint, no bony tenderness  Normal flexion and extension of all digits in right foot. No tenderness to palpation over the MTP joints, metatarsals, or palpation over all digits. No TTP. No redness.   Neurological: He is alert and oriented to person, place, and time. He exhibits normal muscle tone. Coordination normal.  Skin: Skin is warm and dry. No rash noted. He is not diaphoretic. No erythema. No pallor.  Psychiatric: He has a normal  mood and affect. His behavior is normal. Judgment and thought content normal.  Nursing note and vitals reviewed.   ED Course  Procedures (including critical care time)  DIAGNOSTIC STUDIES: Oxygen Saturation is 99% on RA, normal by my interpretation.    COORDINATION OF CARE: 7:03 PM - Suspect neuritis. Discussed treatment plan with pt at bedside which includes Rx steroids, and pt agreed to plan. Patient would like pain medication at this time. Someone else is driving him today.  MDM   Final diagnoses:  None   Nerve like pain in lateral right foot x 1 d - not reproduced with palpation or movement No redness or swelling, do not suspect infection, trauma do not suspect any acute injury Most likely is a neuritis, will treat with a prednisone taper and small amount of pain medicine, with follow-up with his PCP if he does not improve.  Medications  HYDROcodone-acetaminophen (NORCO/VICODIN) 5-325 MG per tablet 2 tablet (1 tablet Oral Given 11/15/14 1918)  predniSONE (DELTASONE) tablet 60 mg (60 mg Oral Given 11/15/14 1918)   Filed Vitals:   11/15/14 1829 11/15/14 1905 11/15/14 1935  BP:  152/70 166/84  Pulse:  67 75  Temp:  97.5 F (36.4 C) 97.3 F (36.3 C)  TempSrc:  Oral Oral  Resp:  20 18  Height: 5\' 10"  (1.778 m)    Weight: 176 lb 8 oz (80.06 kg)    SpO2:  100% 99%    I personally performed the services described in this documentation, which was scribed in my presence. The recorded information has been reviewed and is accurate.     Danelle Berry, PA-C 11/16/14 0248  Tilden Fossa, MD 11/17/14 1610  Tilden Fossa, MD 11/26/14 [phone]

## 2014-11-15 NOTE — ED Notes (Addendum)
Pt repots onset R foot pain last night, from toes to top of foot. Weight bearing RELIEVES the pain. Denies any injuries. Ambulatory.

## 2014-11-19 ENCOUNTER — Encounter (INDEPENDENT_AMBULATORY_CARE_PROVIDER_SITE_OTHER): Payer: Medicare Other | Admitting: Ophthalmology

## 2014-12-03 ENCOUNTER — Encounter (INDEPENDENT_AMBULATORY_CARE_PROVIDER_SITE_OTHER): Payer: Medicare Other | Admitting: Ophthalmology

## 2014-12-03 DIAGNOSIS — E10319 Type 1 diabetes mellitus with unspecified diabetic retinopathy without macular edema: Secondary | ICD-10-CM

## 2014-12-03 DIAGNOSIS — E10359 Type 1 diabetes mellitus with proliferative diabetic retinopathy without macular edema: Secondary | ICD-10-CM

## 2015-01-12 ENCOUNTER — Emergency Department (HOSPITAL_COMMUNITY): Payer: Medicare Other

## 2015-01-12 ENCOUNTER — Emergency Department (HOSPITAL_COMMUNITY)
Admission: EM | Admit: 2015-01-12 | Discharge: 2015-01-12 | Disposition: A | Discharge status: Home / Self Care | Payer: Medicare Other | Attending: Emergency Medicine | Admitting: Emergency Medicine

## 2015-01-12 ENCOUNTER — Encounter (HOSPITAL_COMMUNITY): Payer: Self-pay | Admitting: Emergency Medicine

## 2015-01-12 DIAGNOSIS — I509 Heart failure, unspecified: Secondary | ICD-10-CM | POA: Insufficient documentation

## 2015-01-12 DIAGNOSIS — N186 End stage renal disease: Secondary | ICD-10-CM | POA: Insufficient documentation

## 2015-01-12 DIAGNOSIS — E119 Type 2 diabetes mellitus without complications: Secondary | ICD-10-CM | POA: Diagnosis not present

## 2015-01-12 DIAGNOSIS — Z992 Dependence on renal dialysis: Secondary | ICD-10-CM | POA: Diagnosis not present

## 2015-01-12 DIAGNOSIS — I252 Old myocardial infarction: Secondary | ICD-10-CM | POA: Diagnosis not present

## 2015-01-12 DIAGNOSIS — H269 Unspecified cataract: Secondary | ICD-10-CM | POA: Diagnosis not present

## 2015-01-12 DIAGNOSIS — Z79899 Other long term (current) drug therapy: Secondary | ICD-10-CM | POA: Insufficient documentation

## 2015-01-12 DIAGNOSIS — K219 Gastro-esophageal reflux disease without esophagitis: Secondary | ICD-10-CM | POA: Diagnosis not present

## 2015-01-12 DIAGNOSIS — I120 Hypertensive chronic kidney disease with stage 5 chronic kidney disease or end stage renal disease: Secondary | ICD-10-CM | POA: Insufficient documentation

## 2015-01-12 DIAGNOSIS — Z8619 Personal history of other infectious and parasitic diseases: Secondary | ICD-10-CM | POA: Insufficient documentation

## 2015-01-12 DIAGNOSIS — Z720 Tobacco use: Secondary | ICD-10-CM | POA: Diagnosis not present

## 2015-01-12 DIAGNOSIS — M06052 Rheumatoid arthritis without rheumatoid factor, left hip: Secondary | ICD-10-CM | POA: Diagnosis not present

## 2015-01-12 DIAGNOSIS — Z7982 Long term (current) use of aspirin: Secondary | ICD-10-CM | POA: Diagnosis not present

## 2015-01-12 DIAGNOSIS — Z7952 Long term (current) use of systemic steroids: Secondary | ICD-10-CM | POA: Insufficient documentation

## 2015-01-12 DIAGNOSIS — M25552 Pain in left hip: Secondary | ICD-10-CM | POA: Diagnosis present

## 2015-01-12 DIAGNOSIS — M1610 Unilateral primary osteoarthritis, unspecified hip: Secondary | ICD-10-CM

## 2015-01-12 MED ORDER — KETOROLAC TROMETHAMINE 60 MG/2ML IM SOLN
60.0000 mg | Freq: Once | INTRAMUSCULAR | Status: AC
  Administered 2015-01-12: 60 mg via INTRAMUSCULAR
  Filled 2015-01-12: qty 2

## 2015-01-12 MED ORDER — METHOCARBAMOL 500 MG PO TABS
1000.0000 mg | ORAL_TABLET | Freq: Once | ORAL | Status: AC
  Administered 2015-01-12: 1000 mg via ORAL
  Filled 2015-01-12: qty 2

## 2015-01-12 MED ORDER — METHOCARBAMOL 500 MG PO TABS: 500.0000 mg | ORAL_TABLET | Freq: Two times a day (BID) | ORAL | Status: DC

## 2015-01-12 MED ORDER — MELOXICAM 7.5 MG PO TABS
7.5000 mg | ORAL_TABLET | Freq: Every day | ORAL | Status: DC
Start: 2015-01-12 — End: 2015-04-02

## 2015-01-12 MED ORDER — LIDOCAINE 5 % EX PTCH: 1.0000 | MEDICATED_PATCH | CUTANEOUS | Status: DC

## 2015-01-12 NOTE — ED Notes (Signed)
PER EMS: pt comes from home c/o L hip pain radiating down L leg x 2 days.  Pt denies fall or other injuries, no obvious deformity.  Pt was ambulatory on scene with use of cane and stand-by assist.  Pt is dialysis pt, MWF, last appointment yesterday.  EMS VS: 162/78, 76 HR, 112 CBG.  Pt A&O x 4.

## 2015-01-12 NOTE — ED Provider Notes (Signed)
CSN: [id number]     Arrival date & time 01/12/15  0307 History  This chart was scribe for Jeimy Bickert, MD by Angelene Giovanni, ED Scribe. The patient was seen in room B18C/B18C and the patient's care was started at 3:25 AM.    Chief Complaint  Patient presents with  . Hip Pain   Patient is a 59 y.o. male presenting with hip pain. The history is provided by the patient. No language interpreter was used.  Hip Pain This is a new problem. The current episode started 2 days ago. The problem occurs constantly. The problem has not changed since onset.Pertinent negatives include no chest pain. Nothing aggravates the symptoms. Nothing relieves the symptoms. He has tried nothing for the symptoms. The treatment provided no relief.   HPI Comments: [name] is a 59 y.o. male who presents to the Emergency Department complaining of left hip pain onset 2 days ago. He denies constipation. He denies any injuries to the area or lifting any heavy objects. He states that he no longer makes urine since he is on dialysis.   Past Medical History  Diagnosis Date  . Myocardial infarction   . Hypertension   . GERD (gastroesophageal reflux disease)   . Pulmonary edema     Just prior to starting HD and resolved with HD. ECHO in 2008 showed normal LVEF, mild diiastolic dysfunction.   . Neuropathy     Hx: of in toes  . Hepatitis     Hc: of Hep C  . Cataracts, both eyes     Hx: of "slightly"  . Pancreatitis     Hx: of  . CHF (congestive heart failure)   . Diabetes mellitus     not on medication  . 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  Started HD in 2007, gets HD at [facility] on MWF schedule.     Past Surgical History  Procedure Laterality Date  . Dialysis fistula creation Left   . Cholecystectomy    . Colonoscopy      Hx: of  . Ligation of arteriovenous  fistula Left 11/06/2012    Procedure: PLICATION OF BRACHIO-CEPHALIC ARTERIOVENOUS  FISTULA;  Surgeon: Fransisco Hertz, MD;  Location: [facility] OR;  Service: Vascular;  Laterality: Left;  . Pars plana vitrectomy Right 11/10/2014    Procedure: PARS PLANA VITRECTOMY WITH 25 GAUGE;  Surgeon: Sherrie George, MD;  Location: [facility] OR;  Service: Ophthalmology;  Laterality: Right;  . Membrane peel Right 11/10/2014    Procedure: MEMBRANE PEEL;  Surgeon: Sherrie George, MD;  Location: [facility] OR;  Service: Ophthalmology;  Laterality: Right;  . Gas/fluid exchange Right 11/10/2014    Procedure: GAS/FLUID EXCHANGE;  Surgeon: Sherrie George, MD;  Location: [facility] OR;  Service: Ophthalmology;  Laterality: Right;  . Laser photo ablation Right 11/10/2014    Procedure: LASER PHOTO ABLATION;  Surgeon: Sherrie George, MD;  Location: [facility] OR;  Service: Ophthalmology;  Laterality: Right;  Endo Laser   Family History  Problem Relation Age of Onset  . Diabetes Mother    Social History  Substance Use Topics  . Smoking status: Current Every Day Smoker -- 0.35 packs/day for 43 years    Types: Cigarettes    Last Attempt to Quit: 12/16/2012  . Smokeless tobacco: Never Used  . Alcohol Use: No    Review of Systems  Constitutional: Negative for fever and chills.  Cardiovascular: Negative for chest pain.  Gastrointestinal: Negative for constipation.  Musculoskeletal: Positive for myalgias and arthralgias.  All other systems reviewed and are negative.     Allergies  Eggs or egg-derived products  Home Medications   Prior to Admission medications   Medication Sig Start Date End Date Taking? Authorizing Provider  amLODipine (NORVASC) 10 MG tablet Take 10 mg by mouth at bedtime.    Historical Provider, MD  aspirin 81 MG tablet Take 81 mg by mouth daily.    Historical Provider, MD  b complex-vitamin c-folic acid (NEPHRO-VITE) 0.8 MG TABS Take 0.8 mg by mouth at bedtime.    Historical Provider, MD  bacitracin-polymyxin b (POLYSPORIN) ophthalmic ointment Place 1 application into the right eye 3 (three) times daily. apply to eye every 8 hours  while awake 11/11/14   Sherrie George, MD  benzonatate (TESSALON) 100 MG capsule Take 2 capsules (200 mg total) by mouth 2 (two) times daily as needed for cough. Patient not taking: Reported on 11/10/2014 10/20/13   Antony Madura, PA-C  carvedilol (COREG) 25 MG tablet Take 25 mg by mouth 2 (two) times daily with a meal.    Historical Provider, MD  doxycycline (VIBRAMYCIN) 100 MG capsule Take 1 capsule (100 mg total) by mouth 2 (two) times daily. Patient not taking: Reported on 09/29/2014 10/20/13   Antony Madura, PA-C  gatifloxacin (ZYMAXID) 0.5 % SOLN Place 1 drop into the right eye 4 (four) times daily. 11/11/14   Sherrie George, MD  hydrALAZINE (APRESOLINE) 50 MG tablet Take 50 mg by mouth 3 (three) times daily.    Historical Provider, MD  HYDROcodone-acetaminophen (NORCO/VICODIN) 5-325 MG per tablet Take 2 tablets by mouth every 4 (four) hours as needed. 11/15/14   Danelle Berry, PA-C  lanthanum (FOSRENOL) 500 MG chewable tablet Chew 500 mg by mouth 3 (three) times daily with meals.    Historical Provider, MD  levofloxacin (LEVAQUIN) 500 MG tablet Take 1 tablet (500 mg total) by mouth every other day. For 3 doses Patient not taking: Reported on 11/10/2014 10/05/14   Junius Finner, PA-C  lisinopril (PRINIVIL,ZESTRIL) 40 MG tablet Take 40 mg by mouth daily.    Historical Provider, MD  metroNIDAZOLE (FLAGYL) 500 MG tablet Take 1 tablet (500 mg total) by mouth 2 (two) times daily. One po bid x 7 days Patient not taking: Reported on 11/10/2014 10/05/14   Junius Finner, PA-C  ondansetron (ZOFRAN) 4 MG tablet Take 1 tablet (4 mg total) by mouth every 6 (six) hours. Patient not taking: Reported on 11/10/2014 10/05/14   Junius Finner, PA-C  prednisoLONE acetate (PRED FORTE) 1 % ophthalmic suspension Place 1 drop into the right eye 4 (four) times daily. 11/11/14   Sherrie George, MD  predniSONE (DELTASONE) 20 MG tablet Take 2 tablets (40 mg total) by mouth daily. Take 40 mg by mouth daily for 3 days, then 20mg  by mouth  daily for 3 days, then 10mg  daily for 3 days 11/15/14   Danelle Berry, PA-C  PROAIR HFA 108 (90 BASE) MCG/ACT inhaler Inhale 1 puff into the lungs every 4 (four) hours as needed for wheezing. Wheezing 09/22/14   Historical Provider, MD   BP 149/71 mmHg  Pulse 74  Temp(Src) 98.3 F (36.8 C) (Oral)  Resp 18  Ht 5\' 11"  (1.803 m)  Wt 175 lb (79.379 kg)  BMI 24.42 kg/m2  SpO2 100% Physical Exam  Constitutional: He is oriented to person, place, and time. He appears well-developed and well-nourished. No distress.  HENT:  Head: Normocephalic and  atraumatic.  Mouth/Throat: Oropharynx is clear and moist.  Eyes: Conjunctivae and EOM are normal. Pupils are equal, round, and reactive to light.  Neck: Neck supple. No tracheal deviation present.  Cardiovascular: Normal rate, regular rhythm and normal heart sounds.   Pulmonary/Chest: Effort normal and breath sounds normal. No respiratory distress. He has no wheezes. He has no rales.  Abdominal: Soft. There is no rebound and no guarding.  Hyperactive bowel sounds  Musculoskeletal: Normal range of motion. He exhibits no edema or tenderness.       Left hip: He exhibits normal range of motion, normal strength, no tenderness, no bony tenderness, no swelling, no crepitus, no deformity and no laceration.       Left knee: Normal.       Left ankle: Normal.       Left upper leg: Normal.  Knee is stable Great dorsalis pedis.   Neurological: He is alert and oriented to person, place, and time. He has normal reflexes.  Skin: Skin is warm and dry.  Psychiatric: He has a normal mood and affect. His behavior is normal.  Nursing note and vitals reviewed.   ED Course  Procedures (including critical care time) DIAGNOSTIC STUDIES: Oxygen Saturation is 100% on RA, normal by my interpretation.    COORDINATION OF CARE: 3:30 AM- Pt advised of plan for treatment and pt agrees.    Labs Review Labs Reviewed - No data to display  Imaging Review No results found. I  have personally reviewed and evaluated these images and lab results as part of my medical decision-making.   EKG Interpretation None      MDM   Final diagnoses:  None    Arthritis of the hip.  Will d/c on pain medication, muscle relaxants and follow up  I personally performed the services described in this documentation, which was scribed in my presence. The recorded information has been reviewed and is accurate.    Cy Blamer, MD 01/12/15 [phone]

## 2015-01-12 NOTE — ED Notes (Signed)
Pt requesting food- MD approves

## 2015-01-12 NOTE — ED Notes (Signed)
Patient transported to X-ray with xray tech 

## 2015-01-12 NOTE — Discharge Instructions (Signed)
Heat Therapy °Heat therapy can help ease sore, stiff, injured, and tight muscles and joints. Heat relaxes your muscles, which may help ease your pain.  °RISKS AND COMPLICATIONS °If you have any of the following conditions, do not use heat therapy unless your health care provider has approved: °· Poor circulation. °· Healing wounds or scarred skin in the area being treated. °· Diabetes, heart disease, or high blood pressure. °· Not being able to feel (numbness) the area being treated. °· Unusual swelling of the area being treated. °· Active infections. °· Blood clots. °· Cancer. °· Inability to communicate pain. This may include young children and people who have problems with their brain function (dementia). °· Pregnancy. °Heat therapy should only be used on old, pre-existing, or long-lasting (chronic) injuries. Do not use heat therapy on new injuries unless directed by your health care provider. °HOW TO USE HEAT THERAPY °There are several different kinds of heat therapy, including: °· Moist heat pack. °· Warm water bath. °· Hot water bottle. °· Electric heating pad. °· Heated gel pack. °· Heated wrap. °· Electric heating pad. °Use the heat therapy method suggested by your health care provider. Follow your health care provider's instructions on when and how to use heat therapy. °GENERAL HEAT THERAPY RECOMMENDATIONS °· Do not sleep while using heat therapy. Only use heat therapy while you are awake. °· Your skin may turn pink while using heat therapy. Do not use heat therapy if your skin turns red. °· Do not use heat therapy if you have new pain. °· High heat or long exposure to heat can cause burns. Be careful when using heat therapy to avoid burning your skin. °· Do not use heat therapy on areas of your skin that are already irritated, such as with a rash or sunburn. °SEEK MEDICAL CARE IF: °· You have blisters, redness, swelling, or numbness. °· You have new pain. °· Your pain is worse. °MAKE SURE  YOU: °· Understand these instructions. °· Will watch your condition. °· Will get help right away if you are not doing well or get worse. °Document Released: 07/24/2011 Document Revised: 09/15/2013 Document Reviewed: 06/24/2013 °Exit[employer]® Patient Information ©2015 [employer]. This information is not intended to replace advice given to you by your health care provider. Make sure you discuss any questions you have with your health care provider. ° °

## 2015-01-12 NOTE — ED Notes (Signed)
Pt back to room B18 

## 2015-02-04 ENCOUNTER — Encounter (HOSPITAL_COMMUNITY): Payer: Self-pay | Admitting: Emergency Medicine

## 2015-02-04 ENCOUNTER — Emergency Department (HOSPITAL_COMMUNITY)
Admission: EM | Admit: 2015-02-04 | Discharge: 2015-02-04 | Disposition: A | Discharge status: Home / Self Care | Payer: Medicare Other | Attending: Emergency Medicine | Admitting: Emergency Medicine

## 2015-02-04 DIAGNOSIS — N186 End stage renal disease: Secondary | ICD-10-CM | POA: Diagnosis not present

## 2015-02-04 DIAGNOSIS — Z8709 Personal history of other diseases of the respiratory system: Secondary | ICD-10-CM | POA: Insufficient documentation

## 2015-02-04 DIAGNOSIS — M25552 Pain in left hip: Secondary | ICD-10-CM | POA: Insufficient documentation

## 2015-02-04 DIAGNOSIS — Z79899 Other long term (current) drug therapy: Secondary | ICD-10-CM | POA: Insufficient documentation

## 2015-02-04 DIAGNOSIS — M1990 Unspecified osteoarthritis, unspecified site: Secondary | ICD-10-CM | POA: Diagnosis not present

## 2015-02-04 DIAGNOSIS — I509 Heart failure, unspecified: Secondary | ICD-10-CM | POA: Diagnosis not present

## 2015-02-04 DIAGNOSIS — I120 Hypertensive chronic kidney disease with stage 5 chronic kidney disease or end stage renal disease: Secondary | ICD-10-CM | POA: Insufficient documentation

## 2015-02-04 DIAGNOSIS — Z7982 Long term (current) use of aspirin: Secondary | ICD-10-CM | POA: Diagnosis not present

## 2015-02-04 DIAGNOSIS — E119 Type 2 diabetes mellitus without complications: Secondary | ICD-10-CM | POA: Insufficient documentation

## 2015-02-04 DIAGNOSIS — I252 Old myocardial infarction: Secondary | ICD-10-CM | POA: Diagnosis not present

## 2015-02-04 DIAGNOSIS — Z992 Dependence on renal dialysis: Secondary | ICD-10-CM | POA: Diagnosis not present

## 2015-02-04 DIAGNOSIS — Z8719 Personal history of other diseases of the digestive system: Secondary | ICD-10-CM | POA: Diagnosis not present

## 2015-02-04 DIAGNOSIS — Z720 Tobacco use: Secondary | ICD-10-CM | POA: Insufficient documentation

## 2015-02-04 DIAGNOSIS — Z791 Long term (current) use of non-steroidal anti-inflammatories (NSAID): Secondary | ICD-10-CM | POA: Insufficient documentation

## 2015-02-04 DIAGNOSIS — G8929 Other chronic pain: Secondary | ICD-10-CM | POA: Insufficient documentation

## 2015-02-04 MED ORDER — METHOCARBAMOL 500 MG PO TABS: 1000.0000 mg | ORAL_TABLET | Freq: Three times a day (TID) | ORAL | Status: DC

## 2015-02-04 MED ORDER — METHOCARBAMOL 500 MG PO TABS
1000.0000 mg | ORAL_TABLET | Freq: Once | ORAL | Status: AC
  Administered 2015-02-04: 1000 mg via ORAL
  Filled 2015-02-04: qty 2

## 2015-02-04 MED ORDER — KETOROLAC TROMETHAMINE 30 MG/ML IJ SOLN
30.0000 mg | Freq: Once | INTRAMUSCULAR | Status: AC
  Administered 2015-02-04: 30 mg via INTRAMUSCULAR
  Filled 2015-02-04: qty 1

## 2015-02-04 NOTE — ED Notes (Signed)
The patient has arthritis and has chronic hip pain.  He said they gave him a "shot" and a prescription and he wants the same thing tonight.  He rates his pain 10/10.  The patient is a dialysis patient and he has treatments M,W, and F.

## 2015-02-04 NOTE — ED Notes (Signed)
Patient is alert and orientedx4.  Patient was explained discharge instructions and they understood them with no questions.  The patient's brother, Curly Rim is taking the patient home.

## 2015-02-04 NOTE — ED Provider Notes (Signed)
CSN: [id number]     Arrival date & time 02/04/15  0425 History   First MD Initiated Contact with Patient 02/04/15 [id number]     Chief Complaint  Patient presents with  . Hip Pain    The patient has arthritis and has chronic hip pain.  He said they gave him a "shot" and a prescription and he wants the same thing tonight.     (Consider location/radiation/quality/duration/timing/severity/associated sxs/prior Treatment) HPI Patient presents with concern of recurrent hip pain. Pain is focally in the left hip, anterior lateral. Pain has been present for some time, the worse of the past 2 days. During this time the patient has been walking more than usual. There is no new loss of sensation distally, no other new complains, including abdominal pain, fever. Patient has multiple medical issues, including end-stage renal disease. He tolerated full dialysis session yesterday, no complications. Patient was seen here for similar pain about 3 weeks ago, notes that he improved with intramuscular injection, and initiation of oral therapy, but is unsure of what that was.  Past Medical History  Diagnosis Date  . Myocardial infarction   . Hypertension   . GERD (gastroesophageal reflux disease)   . Pulmonary edema     Just prior to starting HD and resolved with HD. ECHO in 2008 showed normal LVEF, mild diiastolic dysfunction.   . Neuropathy     Hx: of in toes  . Hepatitis     Hc: of Hep C  . Cataracts, both eyes     Hx: of "slightly"  . Pancreatitis     Hx: of  . CHF (congestive heart failure)   . Diabetes mellitus     not on medication  . 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  Started HD in 2007, gets HD at [facility] on MWF schedule.     Past Surgical History  Procedure Laterality Date  . Dialysis fistula creation Left   . Cholecystectomy    . Colonoscopy      Hx: of  . Ligation of arteriovenous  fistula Left 11/06/2012    Procedure: PLICATION OF BRACHIO-CEPHALIC  ARTERIOVENOUS  FISTULA;  Surgeon: Fransisco Hertz, MD;  Location: [facility] OR;  Service: Vascular;  Laterality: Left;  . Pars plana vitrectomy Right 11/10/2014    Procedure: PARS PLANA VITRECTOMY WITH 25 GAUGE;  Surgeon: Sherrie George, MD;  Location: [facility] OR;  Service: Ophthalmology;  Laterality: Right;  . Membrane peel Right 11/10/2014    Procedure: MEMBRANE PEEL;  Surgeon: Sherrie George, MD;  Location: [facility] OR;  Service: Ophthalmology;  Laterality: Right;  . Gas/fluid exchange Right 11/10/2014    Procedure: GAS/FLUID EXCHANGE;  Surgeon: Sherrie George, MD;  Location: [facility] OR;  Service: Ophthalmology;  Laterality: Right;  . Laser photo ablation Right 11/10/2014    Procedure: LASER PHOTO ABLATION;  Surgeon: Sherrie George, MD;  Location: [facility] OR;  Service: Ophthalmology;  Laterality: Right;  Endo Laser   Family History  Problem Relation Age of Onset  . Diabetes Mother    Social History  Substance Use Topics  . Smoking status: Current Every Day Smoker -- 0.35 packs/day for 43 years    Types: Cigarettes    Last Attempt to Quit: 12/16/2012  . Smokeless tobacco: Never Used  . Alcohol Use: No    Review of Systems  Constitutional: Negative for fever.  Cardiovascular: Negative for chest pain.  Gastrointestinal: Negative for constipation.  Musculoskeletal: Positive  for myalgias and arthralgias.  All other systems reviewed and are negative.     Allergies  Eggs or egg-derived products  Home Medications   Prior to Admission medications   Medication Sig Start Date End Date Taking? Authorizing Provider  amLODipine (NORVASC) 5 MG tablet Take 5 mg by mouth 2 (two) times daily. 11/24/14   Historical Provider, MD  aspirin 81 MG tablet Take 81 mg by mouth daily.    Historical Provider, MD  b complex-vitamin c-folic acid (NEPHRO-VITE) 0.8 MG TABS Take 0.8 mg by mouth at bedtime.    Historical Provider, MD  bacitracin-polymyxin b (POLYSPORIN) ophthalmic ointment Place 1 application into the right eye 3  (three) times daily. apply to eye every 8 hours while awake Patient not taking: Reported on 01/12/2015 11/11/14   Sherrie George, MD  benzonatate (TESSALON) 100 MG capsule Take 2 capsules (200 mg total) by mouth 2 (two) times daily as needed for cough. Patient not taking: Reported on 11/10/2014 10/20/13   Antony Madura, PA-C  carvedilol (COREG) 3.125 MG tablet Take 3.125 mg by mouth 2 (two) times daily. 01/08/15   Historical Provider, MD  doxycycline (VIBRAMYCIN) 100 MG capsule Take 1 capsule (100 mg total) by mouth 2 (two) times daily. Patient not taking: Reported on 09/29/2014 10/20/13   Antony Madura, PA-C  FOSRENOL 1000 MG chewable tablet Chew 1,000 mg by mouth 5 (five) times daily. 01/06/15   Historical Provider, MD  gatifloxacin (ZYMAXID) 0.5 % SOLN Place 1 drop into the right eye 4 (four) times daily. Patient not taking: Reported on 01/12/2015 11/11/14   Sherrie George, MD  hydrALAZINE (APRESOLINE) 100 MG tablet Take 100 mg by mouth 3 (three) times daily. 11/05/14   Historical Provider, MD  HYDROcodone-acetaminophen (NORCO/VICODIN) 5-325 MG per tablet Take 2 tablets by mouth every 4 (four) hours as needed. Patient not taking: Reported on 01/12/2015 11/15/14   Danelle Berry, PA-C  levofloxacin (LEVAQUIN) 500 MG tablet Take 1 tablet (500 mg total) by mouth every other day. For 3 doses Patient not taking: Reported on 11/10/2014 10/05/14   Junius Finner, PA-C  lidocaine (LIDODERM) 5 % Place 1 patch onto the skin daily. Remove & Discard patch within 12 hours or as directed by MD 01/12/15   April Palumbo, MD  lisinopril (PRINIVIL,ZESTRIL) 40 MG tablet Take 40 mg by mouth daily.    Historical Provider, MD  meloxicam (MOBIC) 7.5 MG tablet Take 1 tablet (7.5 mg total) by mouth daily. 01/12/15   April Palumbo, MD  methocarbamol (ROBAXIN) 500 MG tablet Take 1 tablet (500 mg total) by mouth 2 (two) times daily. 01/12/15   April Palumbo, MD  metroNIDAZOLE (FLAGYL) 500 MG tablet Take 1 tablet (500 mg total) by mouth 2 (two)  times daily. One po bid x 7 days Patient not taking: Reported on 11/10/2014 10/05/14   Junius Finner, PA-C  ondansetron (ZOFRAN) 4 MG tablet Take 1 tablet (4 mg total) by mouth every 6 (six) hours. Patient not taking: Reported on 11/10/2014 10/05/14   Junius Finner, PA-C  prednisoLONE acetate (PRED FORTE) 1 % ophthalmic suspension Place 1 drop into the right eye 4 (four) times daily. Patient not taking: Reported on 01/12/2015 11/11/14   Sherrie George, MD  predniSONE (DELTASONE) 20 MG tablet Take 2 tablets (40 mg total) by mouth daily. Take 40 mg by mouth daily for 3 days, then  by mouth daily for 3 days, then  daily for 3 days Patient not taking: Reported on 01/12/2015 11/15/14  Danelle Berry, PA-C  PROAIR HFA 108 (90 BASE) MCG/ACT inhaler Inhale 1 puff into the lungs every 4 (four) hours as needed for wheezing. Wheezing 09/22/14   Historical Provider, MD   BP 181/68 mmHg  Pulse 74  Temp(Src) 98.1 F (36.7 C) (Oral)  Resp 20  SpO2 100% Physical Exam  Constitutional: He is oriented to person, place, and time. He appears well-developed and well-nourished. No distress.  HENT:  Head: Normocephalic and atraumatic.  Mouth/Throat: Oropharynx is clear and moist.  Eyes: Conjunctivae and EOM are normal. Pupils are equal, round, and reactive to light.  Neck: Neck supple. No tracheal deviation present.  Pulmonary/Chest: Effort normal. No respiratory distress.  Abdominal: He exhibits no distension.  Musculoskeletal: Normal range of motion. He exhibits no edema or tenderness.       Left hip: He exhibits normal range of motion, normal strength, no tenderness, no bony tenderness, no swelling, no crepitus, no deformity and no laceration.       Left knee: Normal.       Left ankle: Normal.       Left upper leg: Normal.  Knee is stable  Patient can flex and extend each hip independently, spontaneously. There is some hesitancy for strong hip flexion, left, secondary to pain in the hip. No gross  deformity, no appreciable effusion or crepitus  Neurological: He is alert and oriented to person, place, and time. He has normal reflexes.  Skin: Skin is warm and dry.  Psychiatric: He has a normal mood and affect. His behavior is normal.  Nursing note and vitals reviewed.   ED Course  Procedures (including critical care time)  Chart review demonstrates similar evaluation several weeks ago, with x-ray that was unremarkable.   MDM  Patient presents with recurrent hip pain. No evidence for septic arthralgia, nor compromised extremity. No evidence for bacteremia or sepsis. Patient had improvement in his condition here with intramuscular injection, oral therapy, was discharged in stable condition to follow-up with orthopedics as needed.  Gerhard Munch, MD 02/04/15 [phone]

## 2015-02-04 NOTE — ED Notes (Signed)
The patient's fistula is positive for a bruit and a thrill.

## 2015-02-04 NOTE — ED Notes (Signed)
Pt pacing in room talking on cell phone

## 2015-02-04 NOTE — Discharge Instructions (Signed)
As discussed, your evaluation today has been largely reassuring.  But, it is important that you monitor your condition carefully, and do not hesitate to return to the ED if you develop new, or concerning changes in your condition.  Otherwise, please follow-up with our orthopedic physicians for appropriate ongoing care.  

## 2015-02-07 ENCOUNTER — Emergency Department (HOSPITAL_COMMUNITY)
Admission: EM | Admit: 2015-02-07 | Discharge: 2015-02-07 | Disposition: A | Discharge status: Home / Self Care | Payer: Medicare Other | Attending: Emergency Medicine | Admitting: Emergency Medicine

## 2015-02-07 ENCOUNTER — Encounter (HOSPITAL_COMMUNITY): Payer: Self-pay | Admitting: Emergency Medicine

## 2015-02-07 DIAGNOSIS — I509 Heart failure, unspecified: Secondary | ICD-10-CM | POA: Insufficient documentation

## 2015-02-07 DIAGNOSIS — Z992 Dependence on renal dialysis: Secondary | ICD-10-CM | POA: Diagnosis not present

## 2015-02-07 DIAGNOSIS — N186 End stage renal disease: Secondary | ICD-10-CM | POA: Diagnosis not present

## 2015-02-07 DIAGNOSIS — Z720 Tobacco use: Secondary | ICD-10-CM | POA: Insufficient documentation

## 2015-02-07 DIAGNOSIS — I120 Hypertensive chronic kidney disease with stage 5 chronic kidney disease or end stage renal disease: Secondary | ICD-10-CM | POA: Diagnosis not present

## 2015-02-07 DIAGNOSIS — Z79899 Other long term (current) drug therapy: Secondary | ICD-10-CM | POA: Insufficient documentation

## 2015-02-07 DIAGNOSIS — Z7982 Long term (current) use of aspirin: Secondary | ICD-10-CM | POA: Insufficient documentation

## 2015-02-07 DIAGNOSIS — K219 Gastro-esophageal reflux disease without esophagitis: Secondary | ICD-10-CM | POA: Insufficient documentation

## 2015-02-07 DIAGNOSIS — M25552 Pain in left hip: Secondary | ICD-10-CM

## 2015-02-07 DIAGNOSIS — Z8619 Personal history of other infectious and parasitic diseases: Secondary | ICD-10-CM | POA: Diagnosis not present

## 2015-02-07 DIAGNOSIS — I252 Old myocardial infarction: Secondary | ICD-10-CM | POA: Insufficient documentation

## 2015-02-07 DIAGNOSIS — E119 Type 2 diabetes mellitus without complications: Secondary | ICD-10-CM | POA: Insufficient documentation

## 2015-02-07 MED ORDER — ACETAMINOPHEN 325 MG PO TABS
650.0000 mg | ORAL_TABLET | Freq: Once | ORAL | Status: AC
  Administered 2015-02-07: 650 mg via ORAL
  Filled 2015-02-07: qty 2

## 2015-02-07 MED ORDER — IBUPROFEN 600 MG PO TABS: 600.0000 mg | ORAL_TABLET | Freq: Three times a day (TID) | ORAL | Status: DC | PRN

## 2015-02-07 MED ORDER — IBUPROFEN 200 MG PO TABS
600.0000 mg | ORAL_TABLET | Freq: Once | ORAL | Status: AC
  Administered 2015-02-07: 600 mg via ORAL
  Filled 2015-02-07: qty 3

## 2015-02-07 NOTE — ED Notes (Addendum)
Pt arrives via [facility] with c/o chronic hip pain, L sided, states sharp pain, hx of arthritis. Dailysis graft on R side. Pt very rude to staff in triage, just wants whatever shot given last visit.

## 2015-02-07 NOTE — Discharge Instructions (Signed)
Arthritis, Nonspecific °Arthritis is inflammation of a joint. This usually means pain, redness, warmth or swelling are present. One or more joints may be involved. There are a number of types of arthritis. Your caregiver may not be able to tell what type of arthritis you have right away. °CAUSES  °The most common cause of arthritis is the wear and tear on the joint (osteoarthritis). This causes damage to the cartilage, which can break down over time. The knees, hips, back and neck are most often affected by this type of arthritis. °Other types of arthritis and common causes of joint pain include: °· Sprains and other injuries near the joint. Sometimes minor sprains and injuries cause pain and swelling that develop hours later. °· Rheumatoid arthritis. This affects hands, feet and knees. It usually affects both sides of your body at the same time. It is often associated with chronic ailments, fever, weight loss and general weakness. °· Crystal arthritis. Gout and pseudo gout can cause occasional acute severe pain, redness and swelling in the foot, ankle, or knee. °· Infectious arthritis. Bacteria can get into a joint through a break in overlying skin. This can cause infection of the joint. Bacteria and viruses can also spread through the blood and affect your joints. °· Drug, infectious and allergy reactions. Sometimes joints can become mildly painful and slightly swollen with these types of illnesses. °SYMPTOMS  °· Pain is the main symptom. °· Your joint or joints can also be red, swollen and warm or hot to the touch. °· You may have a fever with certain types of arthritis, or even feel overall ill. °· The joint with arthritis will hurt with movement. Stiffness is present with some types of arthritis. °DIAGNOSIS  °Your caregiver will suspect arthritis based on your description of your symptoms and on your exam. Testing may be needed to find the type of arthritis: °· Blood and sometimes urine tests. °· X-ray tests  and sometimes CT or MRI scans. °· Removal of fluid from the joint (arthrocentesis) is done to check for bacteria, crystals or other causes. Your caregiver (or a specialist) will numb the area over the joint with a local anesthetic, and use a needle to remove joint fluid for examination. This procedure is only minimally uncomfortable. °· Even with these tests, your caregiver may not be able to tell what kind of arthritis you have. Consultation with a specialist (rheumatologist) may be helpful. °TREATMENT  °Your caregiver will discuss with you treatment specific to your type of arthritis. If the specific type cannot be determined, then the following general recommendations may apply. °Treatment of severe joint pain includes: °· Rest. °· Elevation. °· Anti-inflammatory medication (for example, ibuprofen) may be prescribed. Avoiding activities that cause increased pain. °· Only take over-the-counter or prescription medicines for pain and discomfort as recommended by your caregiver. °· Cold packs over an inflamed joint may be used for 10 to 15 minutes every hour. Hot packs sometimes feel better, but do not use overnight. Do not use hot packs if you are diabetic without your caregiver's permission. °· A cortisone shot into arthritic joints may help reduce pain and swelling. °· Any acute arthritis that gets worse over the next 1 to 2 days needs to be looked at to be sure there is no joint infection. °Long-term arthritis treatment involves modifying activities and lifestyle to reduce joint stress jarring. This can include weight loss. Also, exercise is needed to nourish the joint cartilage and remove waste. This helps keep the muscles   around the joint strong. °HOME CARE INSTRUCTIONS  °· Do not take aspirin to relieve pain if gout is suspected. This elevates uric acid levels. °· Only take over-the-counter or prescription medicines for pain, discomfort or fever as directed by your caregiver. °· Rest the joint as much as  possible. °· If your joint is swollen, keep it elevated. °· Use crutches if the painful joint is in your leg. °· Drinking plenty of fluids may help for certain types of arthritis. °· Follow your caregiver's dietary instructions. °· Try low-impact exercise such as: °¨ Swimming. °¨ Water aerobics. °¨ Biking. °¨ Walking. °· Morning stiffness is often relieved by a warm shower. °· Put your joints through regular range-of-motion. °SEEK MEDICAL CARE IF:  °· You do not feel better in 24 hours or are getting worse. °· You have side effects to medications, or are not getting better with treatment. °SEEK IMMEDIATE MEDICAL CARE IF:  °· You have a fever. °· You develop severe joint pain, swelling or redness. °· Many joints are involved and become painful and swollen. °· There is severe back pain and/or leg weakness. °· You have loss of bowel or bladder control. °Document Released: 06/08/2004 Document Revised: 07/24/2011 Document Reviewed: 06/24/2008 °[employer]® Patient Information ©2015 [employer]. This information is not intended to replace advice given to you by your health care provider. Make sure you discuss any questions you have with your health care provider. ° °

## 2015-02-07 NOTE — ED Provider Notes (Signed)
CSN: [id number]     Arrival date & time 02/07/15  0307 History   First MD Initiated Contact with Patient 02/07/15 [phone]     Chief Complaint  Patient presents with  . Arthritis      HPI Patient reports ongoing discomfort and pain in his left hip.  He has pain with range of motion of his left hip.  No fevers or chills.  No weakness of his left lower extremity.  Recent x-ray demonstrates arthritis in his left hip.  He's been referred to his primary care physician and orthopedic surgeon neither of which she seen in follow-up yet.  He's tried some occasional ibuprofen and Tylenol at home without improvement in his symptoms.  He presents complaining of pain.   Past Medical History  Diagnosis Date  . Myocardial infarction   . Hypertension   . GERD (gastroesophageal reflux disease)   . Pulmonary edema     Just prior to starting HD and resolved with HD. ECHO in 2008 showed normal LVEF, mild diiastolic dysfunction.   . Neuropathy     Hx: of in toes  . Hepatitis     Hc: of Hep C  . Cataracts, both eyes     Hx: of "slightly"  . Pancreatitis     Hx: of  . CHF (congestive heart failure)   . Diabetes mellitus     not on medication  . 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  Started HD in 2007, gets HD at [facility] on MWF schedule.     Past Surgical History  Procedure Laterality Date  . Dialysis fistula creation Left   . Cholecystectomy    . Colonoscopy      Hx: of  . Ligation of arteriovenous  fistula Left 11/06/2012    Procedure: PLICATION OF BRACHIO-CEPHALIC ARTERIOVENOUS  FISTULA;  Surgeon: Fransisco Hertz, MD;  Location: [facility] OR;  Service: Vascular;  Laterality: Left;  . Pars plana vitrectomy Right 11/10/2014    Procedure: PARS PLANA VITRECTOMY WITH 25 GAUGE;  Surgeon: Sherrie George, MD;  Location: [facility] OR;  Service: Ophthalmology;  Laterality: Right;  . Membrane peel Right 11/10/2014    Procedure: MEMBRANE PEEL;  Surgeon: Sherrie George, MD;  Location: [facility] OR;   Service: Ophthalmology;  Laterality: Right;  . Gas/fluid exchange Right 11/10/2014    Procedure: GAS/FLUID EXCHANGE;  Surgeon: Sherrie George, MD;  Location: [facility] OR;  Service: Ophthalmology;  Laterality: Right;  . Laser photo ablation Right 11/10/2014    Procedure: LASER PHOTO ABLATION;  Surgeon: Sherrie George, MD;  Location: [facility] OR;  Service: Ophthalmology;  Laterality: Right;  Endo Laser   Family History  Problem Relation Age of Onset  . Diabetes Mother    Social History  Substance Use Topics  . Smoking status: Current Every Day Smoker -- 0.35 packs/day for 43 years    Types: Cigarettes    Last Attempt to Quit: 12/16/2012  . Smokeless tobacco: Never Used  . Alcohol Use: No    Review of Systems  All other systems reviewed and are negative.     Allergies  Eggs or egg-derived products  Home Medications   Prior to Admission medications   Medication Sig Start Date End Date Taking? Authorizing Provider  amLODipine (NORVASC) 5 MG tablet Take 5 mg by mouth 2 (two) times daily. 11/24/14   Historical Provider, MD  aspirin 81 MG tablet Take 81 mg by mouth daily.  Historical Provider, MD  b complex-vitamin c-folic acid (NEPHRO-VITE) 0.8 MG TABS Take 0.8 mg by mouth at bedtime.    Historical Provider, MD  bacitracin-polymyxin b (POLYSPORIN) ophthalmic ointment Place 1 application into the right eye 3 (three) times daily. apply to eye every 8 hours while awake Patient not taking: Reported on 01/12/2015 11/11/14   Sherrie George, MD  benzonatate (TESSALON) 100 MG capsule Take 2 capsules (200 mg total) by mouth 2 (two) times daily as needed for cough. Patient not taking: Reported on 11/10/2014 10/20/13   Antony Madura, PA-C  carvedilol (COREG) 3.125 MG tablet Take 3.125 mg by mouth 2 (two) times daily. 01/08/15   Historical Provider, MD  doxycycline (VIBRAMYCIN) 100 MG capsule Take 1 capsule (100 mg total) by mouth 2 (two) times daily. Patient not taking: Reported on 09/29/2014 10/20/13   Antony Madura, PA-C  FOSRENOL 1000 MG chewable tablet Chew 1,000 mg by mouth 5 (five) times daily. 01/06/15   Historical Provider, MD  gatifloxacin (ZYMAXID) 0.5 % SOLN Place 1 drop into the right eye 4 (four) times daily. 11/11/14   Sherrie George, MD  hydrALAZINE (APRESOLINE) 100 MG tablet Take 100 mg by mouth 3 (three) times daily. 11/05/14   Historical Provider, MD  HYDROcodone-acetaminophen (NORCO/VICODIN) 5-325 MG per tablet Take 2 tablets by mouth every 4 (four) hours as needed. Patient not taking: Reported on 01/12/2015 11/15/14   Danelle Berry, PA-C  ibuprofen (ADVIL,MOTRIN) 600 MG tablet Take 1 tablet (600 mg total) by mouth every 8 (eight) hours as needed. 02/07/15   Azalia Bilis, MD  levofloxacin (LEVAQUIN) 500 MG tablet Take 1 tablet (500 mg total) by mouth every other day. For 3 doses Patient not taking: Reported on 11/10/2014 10/05/14   Junius Finner, PA-C  lidocaine (LIDODERM) 5 % Place 1 patch onto the skin daily. Remove & Discard patch within 12 hours or as directed by MD 01/12/15   April Palumbo, MD  lisinopril (PRINIVIL,ZESTRIL) 40 MG tablet Take 40 mg by mouth daily.    Historical Provider, MD  meloxicam (MOBIC) 7.5 MG tablet Take 1 tablet (7.5 mg total) by mouth daily. 01/12/15   April Palumbo, MD  methocarbamol (ROBAXIN) 500 MG tablet Take 2 tablets (1,000 mg total) by mouth 3 (three) times daily. 02/04/15   Gerhard Munch, MD  metroNIDAZOLE (FLAGYL) 500 MG tablet Take 1 tablet (500 mg total) by mouth 2 (two) times daily. One po bid x 7 days Patient not taking: Reported on 11/10/2014 10/05/14   Junius Finner, PA-C  ondansetron (ZOFRAN) 4 MG tablet Take 1 tablet (4 mg total) by mouth every 6 (six) hours. 10/05/14   Junius Finner, PA-C  prednisoLONE acetate (PRED FORTE) 1 % ophthalmic suspension Place 1 drop into the right eye 4 (four) times daily. 11/11/14   Sherrie George, MD  predniSONE (DELTASONE) 20 MG tablet Take 2 tablets (40 mg total) by mouth daily. Take 40 mg by mouth daily for 3 days, then   by mouth daily for 3 days, then  daily for 3 days Patient not taking: Reported on 01/12/2015 11/15/14   Danelle Berry, PA-C  PROAIR HFA 108 (90 BASE) MCG/ACT inhaler Inhale 1 puff into the lungs every 4 (four) hours as needed for wheezing. Wheezing 09/22/14   Historical Provider, MD   BP 149/63 mmHg  Pulse 72  Temp(Src) 97.5 F (36.4 C) (Oral)  Resp 16  SpO2 98% Physical Exam  Constitutional: He is oriented to person, place, and time. He appears well-developed and  well-nourished.  HENT:  Head: Normocephalic and atraumatic.  Eyes: EOM are normal.  Neck: Normal range of motion.  Cardiovascular: Normal rate, regular rhythm, normal heart sounds and intact distal pulses.   Pulmonary/Chest: Effort normal and breath sounds normal. No respiratory distress.  Abdominal: Soft. He exhibits no distension. There is no tenderness.  Musculoskeletal: Normal range of motion.  Normal pulses in left foot.  Normal range of motion of left hip, left knee.  No swelling of the left lower extremity as compared to the right  Neurological: He is alert and oriented to person, place, and time.  Skin: Skin is warm and dry.  Psychiatric: He has a normal mood and affect. Judgment normal.  Nursing note and vitals reviewed.   ED Course  Procedures (including critical care time) Labs Review Labs Reviewed - No data to display  Imaging Review No results found. I have personally reviewed and evaluated these images and lab results as part of my medical decision-making.   EKG Interpretation None      MDM   Final diagnoses:  Left hip pain    Exacerbation of chronic left hip pain.  Anti-inflammatories.  Primary care and with peak follow-up.    Azalia Bilis, MD 02/07/15 [phone]

## 2015-02-15 ENCOUNTER — Emergency Department (HOSPITAL_COMMUNITY): Payer: Medicare Other

## 2015-02-15 ENCOUNTER — Emergency Department (HOSPITAL_COMMUNITY)
Admission: EM | Admit: 2015-02-15 | Discharge: 2015-02-15 | Disposition: A | Discharge status: Home / Self Care | Payer: Medicare Other | Attending: Emergency Medicine | Admitting: Emergency Medicine

## 2015-02-15 ENCOUNTER — Encounter (HOSPITAL_COMMUNITY): Payer: Self-pay | Admitting: *Deleted

## 2015-02-15 DIAGNOSIS — Y9289 Other specified places as the place of occurrence of the external cause: Secondary | ICD-10-CM | POA: Diagnosis not present

## 2015-02-15 DIAGNOSIS — M25521 Pain in right elbow: Secondary | ICD-10-CM

## 2015-02-15 DIAGNOSIS — Z720 Tobacco use: Secondary | ICD-10-CM | POA: Insufficient documentation

## 2015-02-15 DIAGNOSIS — M533 Sacrococcygeal disorders, not elsewhere classified: Secondary | ICD-10-CM

## 2015-02-15 DIAGNOSIS — Z8709 Personal history of other diseases of the respiratory system: Secondary | ICD-10-CM | POA: Insufficient documentation

## 2015-02-15 DIAGNOSIS — I120 Hypertensive chronic kidney disease with stage 5 chronic kidney disease or end stage renal disease: Secondary | ICD-10-CM | POA: Diagnosis not present

## 2015-02-15 DIAGNOSIS — Z79899 Other long term (current) drug therapy: Secondary | ICD-10-CM | POA: Insufficient documentation

## 2015-02-15 DIAGNOSIS — Y998 Other external cause status: Secondary | ICD-10-CM | POA: Diagnosis not present

## 2015-02-15 DIAGNOSIS — W01198A Fall on same level from slipping, tripping and stumbling with subsequent striking against other object, initial encounter: Secondary | ICD-10-CM | POA: Insufficient documentation

## 2015-02-15 DIAGNOSIS — S3992XA Unspecified injury of lower back, initial encounter: Secondary | ICD-10-CM | POA: Insufficient documentation

## 2015-02-15 DIAGNOSIS — H269 Unspecified cataract: Secondary | ICD-10-CM | POA: Diagnosis not present

## 2015-02-15 DIAGNOSIS — S50311A Abrasion of right elbow, initial encounter: Secondary | ICD-10-CM | POA: Insufficient documentation

## 2015-02-15 DIAGNOSIS — Y9389 Activity, other specified: Secondary | ICD-10-CM | POA: Diagnosis not present

## 2015-02-15 DIAGNOSIS — Z791 Long term (current) use of non-steroidal anti-inflammatories (NSAID): Secondary | ICD-10-CM | POA: Insufficient documentation

## 2015-02-15 DIAGNOSIS — N186 End stage renal disease: Secondary | ICD-10-CM | POA: Insufficient documentation

## 2015-02-15 DIAGNOSIS — Z8719 Personal history of other diseases of the digestive system: Secondary | ICD-10-CM | POA: Insufficient documentation

## 2015-02-15 DIAGNOSIS — W19XXXA Unspecified fall, initial encounter: Secondary | ICD-10-CM

## 2015-02-15 DIAGNOSIS — I252 Old myocardial infarction: Secondary | ICD-10-CM | POA: Insufficient documentation

## 2015-02-15 DIAGNOSIS — T148XXA Other injury of unspecified body region, initial encounter: Secondary | ICD-10-CM

## 2015-02-15 DIAGNOSIS — Z7982 Long term (current) use of aspirin: Secondary | ICD-10-CM | POA: Insufficient documentation

## 2015-02-15 MED ORDER — HYDROCODONE-ACETAMINOPHEN 5-325 MG PO TABS
1.0000 | ORAL_TABLET | Freq: Once | ORAL | Status: AC
  Administered 2015-02-15: 1 via ORAL
  Filled 2015-02-15: qty 1

## 2015-02-15 MED ORDER — HYDROCODONE-ACETAMINOPHEN 5-325 MG PO TABS: 1.0000 | ORAL_TABLET | Freq: Four times a day (QID) | ORAL | Status: DC | PRN

## 2015-02-15 NOTE — ED Provider Notes (Signed)
CSN: [id number]     Arrival date & time 02/15/15  1207 History  By signing my name below, I, Marica Otter, attest that this documentation has been prepared under the direction and in the presence of Ahna Konkle Camprubi-Soms, PA-C. Electronically Signed: Marica Otter, ED Scribe. 02/15/2015. 1:00 PM.  Chief Complaint  Patient presents with  . Tailbone Pain  . Elbow Pain   Patient is a 59 y.o. male presenting with extremity pain. The history is provided by the patient. No language interpreter was used.  Extremity Pain This is a new problem. The current episode started 1 to 2 hours ago. The problem occurs constantly. The problem has not changed since onset.Pertinent negatives include no chest pain, no abdominal pain, no headaches and no shortness of breath. The symptoms are aggravated by walking (movement). Nothing relieves the symptoms. He has tried nothing for the symptoms. The treatment provided no relief.   PCP: Rinaldo Cloud, MD HPI Comments: [name] is a 59 y.o. male, with PMHx noted below, including L hip arthritis, DM2, hepC, HTN, MI, and ESRD on MWF dialysis, who presents to the Emergency Department complaining traumatic, 8/10, sharp, non-radiating, intermittent tailbone and right elbow pain onset one hour ago when the bench the pt was sitting on suddenly caved in causing him to hit his tailbone and right elbow; movement aggravates the pain; pt denies taking any measures PTA to alleviate his Sx. Associated Sx include: superficial abrasion to right elbow. Pt denies fever, chills, chest pain, SOB, n/v/d/c, abd pain, dysuria, hematuria (produces very little urine, but still produces some), numbness/tingling, weakness, head trauma, LOC, headaches, vision changes, neck pain, urinary or fecal incontinence, or cauda equina symptoms. Pt reports last tetanus shot was within the past 5 years.      Past Medical History  Diagnosis Date  . Myocardial infarction (HCC)   . Hypertension   . GERD  (gastroesophageal reflux disease)   . Pulmonary edema     Just prior to starting HD and resolved with HD. ECHO in 2008 showed normal LVEF, mild diiastolic dysfunction.   . Neuropathy (HCC)     Hx: of in toes  . Hepatitis     Hc: of Hep C  . Cataracts, both eyes     Hx: of "slightly"  . Pancreatitis     Hx: of  . CHF (congestive heart failure) (HCC)   . Diabetes mellitus     not on medication  . 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  Started HD in 2007, gets HD at [facility] on MWF schedule.     Past Surgical History  Procedure Laterality Date  . Dialysis fistula creation Left   . Cholecystectomy    . Colonoscopy      Hx: of  . Ligation of arteriovenous  fistula Left 11/06/2012    Procedure: PLICATION OF BRACHIO-CEPHALIC ARTERIOVENOUS  FISTULA;  Surgeon: Fransisco Hertz, MD;  Location: [facility] OR;  Service: Vascular;  Laterality: Left;  . Pars plana vitrectomy Right 11/10/2014    Procedure: PARS PLANA VITRECTOMY WITH 25 GAUGE;  Surgeon: Sherrie George, MD;  Location: [facility] OR;  Service: Ophthalmology;  Laterality: Right;  . Membrane peel Right 11/10/2014    Procedure: MEMBRANE PEEL;  Surgeon: Sherrie George, MD;  Location: [facility] OR;  Service: Ophthalmology;  Laterality: Right;  . Gas/fluid exchange Right 11/10/2014    Procedure: GAS/FLUID EXCHANGE;  Surgeon: Sherrie George, MD;  Location: [facility]  OR;  Service: Ophthalmology;  Laterality: Right;  . Laser photo ablation Right 11/10/2014    Procedure: LASER PHOTO ABLATION;  Surgeon: Sherrie George, MD;  Location: [facility] OR;  Service: Ophthalmology;  Laterality: Right;  Endo Laser   Family History  Problem Relation Age of Onset  . Diabetes Mother    Social History  Substance Use Topics  . Smoking status: Current Every Day Smoker -- 0.35 packs/day for 43 years    Types: Cigarettes    Last Attempt to Quit: 12/16/2012  . Smokeless tobacco: Never Used  . Alcohol Use: No    Review of Systems  Constitutional: Negative  for fever and chills.  HENT: Negative for facial swelling (no head injury).   Eyes: Negative for visual disturbance.  Respiratory: Negative for shortness of breath.   Cardiovascular: Negative for chest pain.  Gastrointestinal: Negative for nausea, vomiting, abdominal pain, diarrhea and constipation.  Genitourinary: Negative for dysuria, hematuria and difficulty urinating (no incontinence of urine/stool (produces very little urine but still occasionally does)).  Musculoskeletal: Positive for back pain (tailbone) and arthralgias (tailbone and right elbow pain). Negative for joint swelling and neck pain.  Skin: Positive for wound (superficial abrasion to right elbow).  Allergic/Immunologic: Positive for immunocompromised state (Diabetic).  Neurological: Negative for syncope, weakness, numbness and headaches.  Psychiatric/Behavioral: Negative for confusion.  10 Systems reviewed and all are negative for acute change except as noted in the HPI.   Allergies  Eggs or egg-derived products  Home Medications   Prior to Admission medications   Medication Sig Start Date End Date Taking? Authorizing Provider  amLODipine (NORVASC) 5 MG tablet Take 5 mg by mouth 2 (two) times daily. 11/24/14   Historical Provider, MD  aspirin 81 MG tablet Take 81 mg by mouth daily.    Historical Provider, MD  b complex-vitamin c-folic acid (NEPHRO-VITE) 0.8 MG TABS Take 0.8 mg by mouth at bedtime.    Historical Provider, MD  bacitracin-polymyxin b (POLYSPORIN) ophthalmic ointment Place 1 application into the right eye 3 (three) times daily. apply to eye every 8 hours while awake Patient not taking: Reported on 01/12/2015 11/11/14   Sherrie George, MD  benzonatate (TESSALON) 100 MG capsule Take 2 capsules (200 mg total) by mouth 2 (two) times daily as needed for cough. Patient not taking: Reported on 11/10/2014 10/20/13   Antony Madura, PA-C  carvedilol (COREG) 3.125 MG tablet Take 3.125 mg by mouth 2 (two) times daily. 01/08/15    Historical Provider, MD  doxycycline (VIBRAMYCIN) 100 MG capsule Take 1 capsule (100 mg total) by mouth 2 (two) times daily. Patient not taking: Reported on 09/29/2014 10/20/13   Antony Madura, PA-C  FOSRENOL 1000 MG chewable tablet Chew 1,000 mg by mouth 5 (five) times daily. 01/06/15   Historical Provider, MD  gatifloxacin (ZYMAXID) 0.5 % SOLN Place 1 drop into the right eye 4 (four) times daily. 11/11/14   Sherrie George, MD  hydrALAZINE (APRESOLINE) 100 MG tablet Take 100 mg by mouth 3 (three) times daily. 11/05/14   Historical Provider, MD  HYDROcodone-acetaminophen (NORCO/VICODIN) 5-325 MG per tablet Take 2 tablets by mouth every 4 (four) hours as needed. Patient not taking: Reported on 01/12/2015 11/15/14   Danelle Berry, PA-C  ibuprofen (ADVIL,MOTRIN) 600 MG tablet Take 1 tablet (600 mg total) by mouth every 8 (eight) hours as needed. 02/07/15   Azalia Bilis, MD  levofloxacin (LEVAQUIN) 500 MG tablet Take 1 tablet (500 mg total) by mouth every other day. For 3 doses  Patient not taking: Reported on 11/10/2014 10/05/14   Junius Finner, PA-C  lidocaine (LIDODERM) 5 % Place 1 patch onto the skin daily. Remove & Discard patch within 12 hours or as directed by MD 01/12/15   April Palumbo, MD  lisinopril (PRINIVIL,ZESTRIL) 40 MG tablet Take 40 mg by mouth daily.    Historical Provider, MD  meloxicam (MOBIC) 7.5 MG tablet Take 1 tablet (7.5 mg total) by mouth daily. 01/12/15   April Palumbo, MD  methocarbamol (ROBAXIN) 500 MG tablet Take 2 tablets (1,000 mg total) by mouth 3 (three) times daily. 02/04/15   Gerhard Munch, MD  metroNIDAZOLE (FLAGYL) 500 MG tablet Take 1 tablet (500 mg total) by mouth 2 (two) times daily. One po bid x 7 days Patient not taking: Reported on 11/10/2014 10/05/14   Junius Finner, PA-C  ondansetron (ZOFRAN) 4 MG tablet Take 1 tablet (4 mg total) by mouth every 6 (six) hours. 10/05/14   Junius Finner, PA-C  prednisoLONE acetate (PRED FORTE) 1 % ophthalmic suspension Place 1 drop into the  right eye 4 (four) times daily. 11/11/14   Sherrie George, MD  predniSONE (DELTASONE) 20 MG tablet Take 2 tablets (40 mg total) by mouth daily. Take 40 mg by mouth daily for 3 days, then 20mg  by mouth daily for 3 days, then 10mg  daily for 3 days Patient not taking: Reported on 01/12/2015 11/15/14   Danelle Berry, PA-C  PROAIR HFA 108 (90 BASE) MCG/ACT inhaler Inhale 1 puff into the lungs every 4 (four) hours as needed for wheezing. Wheezing 09/22/14   Historical Provider, MD   Triage Vitals: BP 155/75 mmHg  Pulse 61  Temp(Src) 98.1 F (36.7 C) (Oral)  Resp 18  SpO2 100% Physical Exam  Constitutional: He is oriented to person, place, and time. Vital signs are normal. He appears well-developed and well-nourished.  Non-toxic appearance. No distress.  Afebrile, nontoxic, NAD  HENT:  Head: Normocephalic and atraumatic.  Mouth/Throat: Mucous membranes are normal.  Eyes: Conjunctivae and EOM are normal. Right eye exhibits no discharge. Left eye exhibits no discharge.  Neck: Normal range of motion. Neck supple.  Cardiovascular: Normal rate and intact distal pulses.   Pulmonary/Chest: Effort normal. No respiratory distress.  Abdominal: Normal appearance. He exhibits no distension.  Musculoskeletal: Normal range of motion.       Right elbow: He exhibits laceration (abrasion). He exhibits normal range of motion, no swelling and no deformity. Tenderness found. Olecranon process tenderness noted.       Lumbar back: He exhibits tenderness, bony tenderness and spasm. He exhibits normal range of motion and no deformity.  lumbar spine with FROM intact with mild midline spinous process TTP, no bony stepoffs or deformities, with mild left sided paraspinous muscle TTP and palpable muscle spasms. Mild midline tenderness into the coccyx. Strength 5/5 in all extremities, sensation grossly intact in all extremities, gait steady and nonantalgic. No overlying skin changes.  Right elbow with FROM intact, no swelling or  bruising/erythema, with small abrasio to olecranon process, with mild TTP in this area.  Neurological: He is alert and oriented to person, place, and time. He has normal strength. No sensory deficit.  Skin: Skin is warm and dry. Abrasion noted. No rash noted.  Abrasion to right elbow as noted above.   Psychiatric: He has a normal mood and affect.  Nursing note and vitals reviewed.  ED Course  Procedures (including critical care time) DIAGNOSTIC STUDIES: Oxygen Saturation is 100% on ra, nl by my interpretation.  COORDINATION OF CARE: 12:40 PM: Discussed treatment plan which includes imaging and pain meds with pt at bedside; patient verbalizes understanding and agrees with treatment plan.  Imaging Review Dg Lumbar Spine Complete  02/15/2015   CLINICAL DATA:  Back pain after fall.  EXAM: LUMBAR SPINE - COMPLETE 4+ VIEW  COMPARISON:  10/05/2014 CT abdomen/pelvis.  FINDINGS: This report assumes 5 non rib-bearing lumbar vertebrae.  Lumbar vertebral body heights are preserved, with no fracture or suspicious focal osseous lesion.  Lumbar disc heights are preserved. No spondylosis. No spondylolisthesis. No appreciable facet arthropathy. No foraminal stenosis. Cholecystectomy clips are seen in the right upper quadrant of the abdomen. Vascular calcifications are noted throughout the abdominal aorta and branch vessels. There is patchy radiodense material within the splenic flexure of the colon associated with mild to moderate colonic stool.  IMPRESSION: Negative.   Electronically Signed   By: Delbert Phenix M.D.   On: 02/15/2015 13:51   Dg Sacrum/coccyx  02/15/2015   CLINICAL DATA:  Back and tailbone injury, fall  EXAM: SACRUM AND COCCYX - 2+ VIEW  COMPARISON:  10/05/2014  FINDINGS: Normal symmetric SI joints. Visualized bony pelvis intact. No diastases. Rami appear intact. Sacrococcygeal alignment appears stable. No definite displaced fracture or interval change compared 10/05/2014. Aortoiliac  atherosclerosis noted.  IMPRESSION: No acute finding by plain radiography.  Stable exam.   Electronically Signed   By: Judie Petit.  Shick M.D.   On: 02/15/2015 13:49   Dg Elbow Complete Right  02/15/2015   CLINICAL DATA:  Sharp intermittent right elbow pain  EXAM: RIGHT ELBOW - COMPLETE 3+ VIEW  COMPARISON:  None.  FINDINGS: There is no evidence of fracture, dislocation, or joint effusion. There is no evidence of arthropathy or other focal bone abnormality. Postsurgical center seen within the anterior soft tissues.  IMPRESSION: No acute osseous abnormality identified.   Electronically Signed   By: Ted Mcalpine M.D.   On: 02/15/2015 13:49   I have personally reviewed and evaluated these images as part of my medical decision-making.   MDM   Final diagnoses:  Right elbow pain  Coccyx pain  Fall, initial encounter  Abrasion    59 y.o. male here with tailbone pain and R elbow pain after fall when the chair he was sitting in collapsed. Small abrasion to elbow. UTD on tetanus. On exam, diffuse lumbar and coccyx midline tenderness, and olecranon TTP, will obtain imaging. Pt ambulatory and all extremities neurovascularly intact. Will give pain meds and reassess after imaging.   2:10 PM xrays neg. Will d/c home with instructions to use ice/heat, and ibuprofen/norco. F/up with PCP in 1wk. I explained the diagnosis and have given explicit precautions to return to the ER including for any other new or worsening symptoms. The patient understands and accepts the medical plan as it's been dictated and I have answered their questions. Discharge instructions concerning home care and prescriptions have been given. The patient is STABLE and is discharged to home in good condition.   I personally performed the services described in this documentation, which was scribed in my presence. The recorded information has been reviewed and is accurate.  BP 155/75 mmHg  Pulse 61  Temp(Src) 98.1 F (36.7 C) (Oral)  Resp  18  SpO2 100%  Meds ordered this encounter  Medications  . HYDROcodone-acetaminophen (NORCO/VICODIN) 5-325 MG per tablet 1 tablet    Sig:   . HYDROcodone-acetaminophen (NORCO) 5-325 MG tablet    Sig: Take 1 tablet by mouth every 6 (six) hours  as needed for severe pain.    Dispense:  6 tablet    Refill:  0    Order Specific Question:  Supervising Provider    Answer:  Eber Hong [[id number]]      Shakeitha Umbaugh Camprubi-Soms, PA-C 02/15/15 1410  Linwood Dibbles, MD 02/19/15 [phone]

## 2015-02-15 NOTE — Discharge Instructions (Signed)
Ice and elevate areas of pain throughout the day. Alternate between ibuprofen and norco as directed for pain relief. Do not drive or operate machinery with pain medication use. Follow up with your regular doctor in 1 week for recheck of symptoms. Return to the ER for changes or worsening symptoms.    Tailbone Injury The tailbone (coccyx) is the small bone at the lower end of the spine. A tailbone injury may involve stretched ligaments, bruising, or a broken bone (fracture). Women are more vulnerable to this injury due to having a wider pelvis. CAUSES  This type of injury typically occurs from falling and landing on the tailbone. Repeated strain or friction from actions such as rowing and bicycling may also injure the area. The tailbone can be injured during childbirth. Infections or tumors may also press on the tailbone and cause pain. Sometimes, the cause of injury is unknown. SYMPTOMS   Bruising.  Pain when sitting.  Painful bowel movements.  In women, pain during intercourse. DIAGNOSIS  Your caregiver can diagnose a tailbone injury based on your symptoms and a physical exam. X-rays may be taken if a fracture is suspected. Your caregiver may also use an MRI scan imaging test to evaluate your symptoms. TREATMENT  Your caregiver may prescribe medicines to help relieve your pain. Most tailbone injuries heal on their own in 4 to 6 weeks. However, if the injury is caused by an infection or tumor, the recovery period may vary. PREVENTION  Wear appropriate padding and sports gear when bicycling and rowing. This can help prevent an injury from repeated strain or friction. HOME CARE INSTRUCTIONS   Put ice on the injured area.  Put ice in a plastic bag.  Place a towel between your skin and the bag.  Leave the ice on for 15-20 minutes, every hour while awake for the first 1 to 2 days.  Sit on a large, rubber or inflated ring or cushion to ease your pain. Lean forward when sitting to help  decrease discomfort.  Avoid sitting for long periods of time.  Increase your activity as the pain allows.  Only take over-the-counter or prescription medicines for pain, discomfort, or fever as directed by your caregiver.  You may use stool softeners if it is painful to have a bowel movement, or as directed by your caregiver.  Eat a diet with plenty of fiber to help prevent constipation.  Keep all follow-up appointments as directed by your caregiver. SEEK MEDICAL CARE IF:   Your pain becomes worse.  Your bowel movements cause a great deal of discomfort.  You are unable to have a bowel movement.  You have a fever. MAKE SURE YOU:  Understand these instructions.  Will watch your condition.  Will get help right away if you are not doing well or get worse. Document Released: 04/28/2000 Document Revised: 07/24/2011 Document Reviewed: 11/24/2010 [facility] Patient Information 2015 [place], [place]. This information is not intended to replace advice given to you by your health care provider. Make sure you discuss any questions you have with your health care provider.  Musculoskeletal Pain Musculoskeletal pain is muscle and boney aches and pains. These pains can occur in any part of the body. Your caregiver may treat you without knowing the cause of the pain. They may treat you if blood or urine tests, X-rays, and other tests were normal.  CAUSES There is often not a definite cause or reason for these pains. These pains may be caused by a type of germ (virus).  The discomfort may also come from overuse. Overuse includes working out too hard when your body is not fit. Boney aches also come from weather changes. Bone is sensitive to atmospheric pressure changes. HOME CARE INSTRUCTIONS   Ask when your test results will be ready. Make sure you get your test results.  Only take over-the-counter or prescription medicines for pain, discomfort, or fever as directed by your caregiver. If you were  given medications for your condition, do not drive, operate machinery or power tools, or sign legal documents for 24 hours. Do not drink alcohol. Do not take sleeping pills or other medications that may interfere with treatment.  Continue all activities unless the activities cause more pain. When the pain lessens, slowly resume normal activities. Gradually increase the intensity and duration of the activities or exercise.  During periods of severe pain, bed rest may be helpful. Lay or sit in any position that is comfortable.  Putting ice on the injured area.  Put ice in a bag.  Place a towel between your skin and the bag.  Leave the ice on for 15 to 20 minutes, 3 to 4 times a day.  Follow up with your caregiver for continued problems and no reason can be found for the pain. If the pain becomes worse or does not go away, it may be necessary to repeat tests or do additional testing. Your caregiver may need to look further for a possible cause. SEEK IMMEDIATE MEDICAL CARE IF:  You have pain that is getting worse and is not relieved by medications.  You develop chest pain that is associated with shortness or breath, sweating, feeling sick to your stomach (nauseous), or throw up (vomit).  Your pain becomes localized to the abdomen.  You develop any new symptoms that seem different or that concern you. MAKE SURE YOU:   Understand these instructions.  Will watch your condition.  Will get help right away if you are not doing well or get worse. Document Released: 05/01/2005 Document Revised: 07/24/2011 Document Reviewed: 01/03/2013 [facility] Patient Information 2015 [place], [place]. This information is not intended to replace advice given to you by your health care provider. Make sure you discuss any questions you have with your health care provider.  Cryotherapy Cryotherapy means treatment with cold. Ice or gel packs can be used to reduce both pain and swelling. Ice is the most helpful  within the first 24 to 48 hours after an injury or flare-up from overusing a muscle or joint. Sprains, strains, spasms, burning pain, shooting pain, and aches can all be eased with ice. Ice can also be used when recovering from surgery. Ice is effective, has very few side effects, and is safe for most people to use. PRECAUTIONS  Ice is not a safe treatment option for people with:  Raynaud phenomenon. This is a condition affecting small blood vessels in the extremities. Exposure to cold may cause your problems to return.  Cold hypersensitivity. There are many forms of cold hypersensitivity, including:  Cold urticaria. Red, itchy hives appear on the skin when the tissues begin to warm after being iced.  Cold erythema. This is a red, itchy rash caused by exposure to cold.  Cold hemoglobinuria. Red blood cells break down when the tissues begin to warm after being iced. The hemoglobin that carry oxygen are passed into the urine because they cannot combine with blood proteins fast enough.  Numbness or altered sensitivity in the area being iced. If you have any of the following  conditions, do not use ice until you have discussed cryotherapy with your caregiver:  Heart conditions, such as arrhythmia, angina, or chronic heart disease.  High blood pressure.  Healing wounds or open skin in the area being iced.  Current infections.  Rheumatoid arthritis.  Poor circulation.  Diabetes. Ice slows the blood flow in the region it is applied. This is beneficial when trying to stop inflamed tissues from spreading irritating chemicals to surrounding tissues. However, if you expose your skin to cold temperatures for too long or without the proper protection, you can damage your skin or nerves. Watch for signs of skin damage due to cold. HOME CARE INSTRUCTIONS Follow these tips to use ice and cold packs safely.  Place a dry or damp towel between the ice and skin. A damp towel will cool the skin more  quickly, so you may need to shorten the time that the ice is used.  For a more rapid response, add gentle compression to the ice.  Ice for no more than 10 to 20 minutes at a time. The bonier the area you are icing, the less time it will take to get the benefits of ice.  Check your skin after 5 minutes to make sure there are no signs of a poor response to cold or skin damage.  Rest 20 minutes or more between uses.  Once your skin is numb, you can end your treatment. You can test numbness by very lightly touching your skin. The touch should be so light that you do not see the skin dimple from the pressure of your fingertip. When using ice, most people will feel these normal sensations in this order: cold, burning, aching, and numbness.  Do not use ice on someone who cannot communicate their responses to pain, such as small children or people with dementia. HOW TO MAKE AN ICE PACK Ice packs are the most common way to use ice therapy. Other methods include ice massage, ice baths, and cryosprays. Muscle creams that cause a cold, tingly feeling do not offer the same benefits that ice offers and should not be used as a substitute unless recommended by your caregiver. To make an ice pack, do one of the following:  Place crushed ice or a bag of frozen vegetables in a sealable plastic bag. Squeeze out the excess air. Place this bag inside another plastic bag. Slide the bag into a pillowcase or place a damp towel between your skin and the bag.  Mix 3 parts water with 1 part rubbing alcohol. Freeze the mixture in a sealable plastic bag. When you remove the mixture from the freezer, it will be slushy. Squeeze out the excess air. Place this bag inside another plastic bag. Slide the bag into a pillowcase or place a damp towel between your skin and the bag. SEEK MEDICAL CARE IF:  You develop white spots on your skin. This may give the skin a blotchy (mottled) appearance.  Your skin turns blue or  pale.  Your skin becomes waxy or hard.  Your swelling gets worse. MAKE SURE YOU:   Understand these instructions.  Will watch your condition.  Will get help right away if you are not doing well or get worse. Document Released: 12/26/2010 Document Revised: 09/15/2013 Document Reviewed: 12/26/2010 [facility] Patient Information 2015 [place], [place]. This information is not intended to replace advice given to you by your health care provider. Make sure you discuss any questions you have with your health care provider.  Heat Therapy Heat  therapy can help ease sore, stiff, injured, and tight muscles and joints. Heat relaxes your muscles, which may help ease your pain.  RISKS AND COMPLICATIONS If you have any of the following conditions, do not use heat therapy unless your health care provider has approved:  Poor circulation.  Healing wounds or scarred skin in the area being treated.  Diabetes, heart disease, or high blood pressure.  Not being able to feel (numbness) the area being treated.  Unusual swelling of the area being treated.  Active infections.  Blood clots.  Cancer.  Inability to communicate pain. This may include young children and people who have problems with their brain function (dementia).  Pregnancy. Heat therapy should only be used on old, pre-existing, or long-lasting (chronic) injuries. Do not use heat therapy on new injuries unless directed by your health care provider. HOW TO USE HEAT THERAPY There are several different kinds of heat therapy, including:  Moist heat pack.  Warm water bath.  Hot water bottle.  Electric heating pad.  Heated gel pack.  Heated wrap.  Electric heating pad. Use the heat therapy method suggested by your health care provider. Follow your health care provider's instructions on when and how to use heat therapy. GENERAL HEAT THERAPY RECOMMENDATIONS  Do not sleep while using heat therapy. Only use heat therapy while you are  awake.  Your skin may turn pink while using heat therapy. Do not use heat therapy if your skin turns red.  Do not use heat therapy if you have new pain.  High heat or long exposure to heat can cause burns. Be careful when using heat therapy to avoid burning your skin.  Do not use heat therapy on areas of your skin that are already irritated, such as with a rash or sunburn. SEEK MEDICAL CARE IF:  You have blisters, redness, swelling, or numbness.  You have new pain.  Your pain is worse. MAKE SURE YOU:  Understand these instructions.  Will watch your condition.  Will get help right away if you are not doing well or get worse. Document Released: 07/24/2011 Document Revised: 09/15/2013 Document Reviewed: 06/24/2013 [facility] Patient Information 2015 [place], [place]. This information is not intended to replace advice given to you by your health care provider. Make sure you discuss any questions you have with your health care provider.
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
## 2015-02-15 NOTE — ED Notes (Signed)
Declined W/C at D/C and was escorted to lobby by RN. 

## 2015-02-18 ENCOUNTER — Encounter (INDEPENDENT_AMBULATORY_CARE_PROVIDER_SITE_OTHER): Payer: Medicare Other | Admitting: Ophthalmology

## 2015-03-04 ENCOUNTER — Encounter (INDEPENDENT_AMBULATORY_CARE_PROVIDER_SITE_OTHER): Payer: Medicare Other | Admitting: Ophthalmology

## 2015-03-19 ENCOUNTER — Encounter (HOSPITAL_COMMUNITY): Payer: Self-pay

## 2015-03-19 ENCOUNTER — Emergency Department (HOSPITAL_COMMUNITY): Payer: Medicare Other

## 2015-03-19 ENCOUNTER — Emergency Department (HOSPITAL_COMMUNITY)
Admission: EM | Admit: 2015-03-19 | Discharge: 2015-03-19 | Disposition: A | Discharge status: Home / Self Care | Payer: Medicare Other | Attending: Emergency Medicine | Admitting: Emergency Medicine

## 2015-03-19 DIAGNOSIS — K219 Gastro-esophageal reflux disease without esophagitis: Secondary | ICD-10-CM | POA: Insufficient documentation

## 2015-03-19 DIAGNOSIS — K279 Peptic ulcer, site unspecified, unspecified as acute or chronic, without hemorrhage or perforation: Secondary | ICD-10-CM

## 2015-03-19 DIAGNOSIS — N186 End stage renal disease: Secondary | ICD-10-CM | POA: Insufficient documentation

## 2015-03-19 DIAGNOSIS — I509 Heart failure, unspecified: Secondary | ICD-10-CM | POA: Diagnosis not present

## 2015-03-19 DIAGNOSIS — Z992 Dependence on renal dialysis: Secondary | ICD-10-CM | POA: Diagnosis not present

## 2015-03-19 DIAGNOSIS — I252 Old myocardial infarction: Secondary | ICD-10-CM | POA: Insufficient documentation

## 2015-03-19 DIAGNOSIS — Z8619 Personal history of other infectious and parasitic diseases: Secondary | ICD-10-CM | POA: Diagnosis not present

## 2015-03-19 DIAGNOSIS — R1013 Epigastric pain: Secondary | ICD-10-CM | POA: Diagnosis present

## 2015-03-19 DIAGNOSIS — Z79899 Other long term (current) drug therapy: Secondary | ICD-10-CM | POA: Insufficient documentation

## 2015-03-19 DIAGNOSIS — R112 Nausea with vomiting, unspecified: Secondary | ICD-10-CM

## 2015-03-19 DIAGNOSIS — E119 Type 2 diabetes mellitus without complications: Secondary | ICD-10-CM | POA: Insufficient documentation

## 2015-03-19 DIAGNOSIS — Z720 Tobacco use: Secondary | ICD-10-CM | POA: Diagnosis not present

## 2015-03-19 DIAGNOSIS — I120 Hypertensive chronic kidney disease with stage 5 chronic kidney disease or end stage renal disease: Secondary | ICD-10-CM | POA: Insufficient documentation

## 2015-03-19 DIAGNOSIS — R197 Diarrhea, unspecified: Secondary | ICD-10-CM

## 2015-03-19 LAB — CBC WITH DIFFERENTIAL/PLATELET
BASOS PCT: 2 %
Basophils Absolute: 0.1 10*3/uL (ref 0.0–0.1)
EOS ABS: 0.7 10*3/uL (ref 0.0–0.7)
EOS PCT: 11 %
HCT: 40.6 % (ref 39.0–52.0)
Hemoglobin: 13.3 g/dL (ref 13.0–17.0)
Lymphocytes Relative: 18 %
Lymphs Abs: 1.1 10*3/uL (ref 0.7–4.0)
MCH: 31.3 pg (ref 26.0–34.0)
MCHC: 32.8 g/dL (ref 30.0–36.0)
MCV: 95.5 fL (ref 78.0–100.0)
MONO ABS: 0.6 10*3/uL (ref 0.1–1.0)
MONOS PCT: 9 %
NEUTROS PCT: 60 %
Neutro Abs: 3.6 10*3/uL (ref 1.7–7.7)
PLATELETS: 68 10*3/uL — AB (ref 150–400)
RBC: 4.25 MIL/uL (ref 4.22–5.81)
RDW: 15.5 % (ref 11.5–15.5)
WBC: 6 10*3/uL (ref 4.0–10.5)

## 2015-03-19 LAB — COMPREHENSIVE METABOLIC PANEL
ALBUMIN: 3 g/dL — AB (ref 3.5–5.0)
ALT: 24 U/L (ref 17–63)
ANION GAP: 15 (ref 5–15)
AST: 49 U/L — ABNORMAL HIGH (ref 15–41)
Alkaline Phosphatase: 151 U/L — ABNORMAL HIGH (ref 38–126)
BUN: 52 mg/dL — ABNORMAL HIGH (ref 6–20)
CO2: 23 mmol/L (ref 22–32)
Calcium: 9 mg/dL (ref 8.9–10.3)
Chloride: 99 mmol/L — ABNORMAL LOW (ref 101–111)
Creatinine, Ser: 9.25 mg/dL — ABNORMAL HIGH (ref 0.61–1.24)
GFR calc non Af Amer: 5 mL/min — ABNORMAL LOW (ref >60)
GFR, EST AFRICAN AMERICAN: 6 mL/min — AB (ref >60)
Glucose, Bld: 76 mg/dL (ref 65–99)
POTASSIUM: 5 mmol/L (ref 3.5–5.1)
SODIUM: 137 mmol/L (ref 135–145)
Total Bilirubin: 1.1 mg/dL (ref 0.3–1.2)
Total Protein: 6.7 g/dL (ref 6.5–8.1)

## 2015-03-19 MED ORDER — FAMOTIDINE 20 MG PO TABS: 20.0000 mg | ORAL_TABLET | Freq: Two times a day (BID) | ORAL | Status: DC

## 2015-03-19 MED ORDER — HYDROMORPHONE HCL 1 MG/ML IJ SOLN
1.0000 mg | Freq: Once | INTRAMUSCULAR | Status: AC
  Administered 2015-03-19: 1 mg via INTRAVENOUS
  Filled 2015-03-19: qty 1

## 2015-03-19 MED ORDER — ONDANSETRON HCL 4 MG/2ML IJ SOLN
4.0000 mg | Freq: Once | INTRAMUSCULAR | Status: AC
  Administered 2015-03-19: 4 mg via INTRAVENOUS
  Filled 2015-03-19: qty 2

## 2015-03-19 MED ORDER — IOHEXOL 300 MG/ML  SOLN
25.0000 mL | Freq: Once | INTRAMUSCULAR | Status: AC | PRN
  Administered 2015-03-19: 25 mL via ORAL

## 2015-03-19 MED ORDER — BISMUTH SUBSALICYLATE 262 MG/15ML PO SUSP: 30.0000 mL | Freq: Four times a day (QID) | ORAL | Status: DC | PRN

## 2015-03-19 MED ORDER — IOHEXOL 300 MG/ML  SOLN
80.0000 mL | Freq: Once | INTRAMUSCULAR | Status: AC | PRN
  Administered 2015-03-19: 80 mL via INTRAVENOUS

## 2015-03-19 MED ORDER — RANITIDINE HCL 150 MG PO CAPS: 150.0000 mg | ORAL_CAPSULE | Freq: Every day | ORAL | Status: DC

## 2015-03-19 NOTE — ED Notes (Signed)
PA at the bedside.

## 2015-03-19 NOTE — ED Notes (Signed)
Called Lab about patient's blood work

## 2015-03-19 NOTE — ED Provider Notes (Signed)
CSN: [id number]     Arrival date & time 03/19/15  4098 History   First MD Initiated Contact with Patient 03/19/15 [id number]     Chief Complaint  Patient presents with  . Abdominal Pain     (Consider location/radiation/quality/duration/timing/severity/associated sxs/prior Treatment) HPI  EDKER PUNT is a 59 y.o. male, pt with history of MI, HTN, GERD, CHF, and CKD on dialysis, presenting to the ED with epigastric abdominal pain since this morning. Patient rates the pain 10 out of 10, states it is sharp and achy, and nonradiating. Patient also complains of nausea, vomiting, and diarrhea. 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 Patient does not take medication for his hepatitis C. Patient denies home use of narcotic pain medicines. Patient asked for Dilaudid by name. Patient is also a dialysis patient no longer makes urine. Last dialysis was Wednesday of this week. Patient was supposed to go for dialysis this morning.  Past Medical History  Diagnosis Date  . Myocardial infarction (HCC)   . Hypertension   . GERD (gastroesophageal reflux disease)   . Pulmonary edema     Just prior to starting HD and resolved with HD. ECHO in 2008 showed normal LVEF, mild diiastolic dysfunction.   . Neuropathy (HCC)     Hx: of in toes  . Hepatitis     Hc: of Hep C  . Cataracts, both eyes     Hx: of "slightly"  . Pancreatitis     Hx: of  . CHF (congestive heart failure) (HCC)   . Diabetes mellitus     not on medication  . 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  Started HD in 2007, gets HD at [facility] on MWF schedule.     Past Surgical History  Procedure Laterality Date  . Dialysis fistula creation Left   . Cholecystectomy    . Colonoscopy      Hx: of  . Ligation of arteriovenous  fistula Left 11/06/2012    Procedure: PLICATION OF BRACHIO-CEPHALIC ARTERIOVENOUS  FISTULA;  Surgeon: Fransisco Hertz, MD;  Location: [facility] OR;  Service: Vascular;   Laterality: Left;  . Pars plana vitrectomy Right 11/10/2014    Procedure: PARS PLANA VITRECTOMY WITH 25 GAUGE;  Surgeon: Sherrie George, MD;  Location: [facility] OR;  Service: Ophthalmology;  Laterality: Right;  . Membrane peel Right 11/10/2014    Procedure: MEMBRANE PEEL;  Surgeon: Sherrie George, MD;  Location: [facility] OR;  Service: Ophthalmology;  Laterality: Right;  . Gas/fluid exchange Right 11/10/2014    Procedure: GAS/FLUID EXCHANGE;  Surgeon: Sherrie George, MD;  Location: [facility] OR;  Service: Ophthalmology;  Laterality: Right;  . Laser photo ablation Right 11/10/2014    Procedure: LASER PHOTO ABLATION;  Surgeon: Sherrie George, MD;  Location: [facility] OR;  Service: Ophthalmology;  Laterality: Right;  Endo Laser   Family History  Problem Relation Age of Onset  . Diabetes Mother    Social History  Substance Use Topics  . Smoking status: Current Every Day Smoker -- 0.35 packs/day for 43 years    Types: Cigarettes    Last Attempt to Quit: 12/16/2012  . Smokeless tobacco: Never Used  . Alcohol Use: No    Review of Systems  Constitutional: Negative for fever, chills, diaphoresis and unexpected weight change.  Respiratory: Negative for cough, chest tightness and shortness of breath.   Cardiovascular: Negative for chest pain, palpitations and leg swelling.  Gastrointestinal: Positive for nausea, vomiting, abdominal pain and diarrhea. Negative for constipation and blood in stool.  Genitourinary: Negative for dysuria and flank pain.  Musculoskeletal: Negative for back pain.  Skin: Negative for color change and pallor.  Neurological: Negative for dizziness, syncope, weakness and light-headedness.  All other systems reviewed and are negative.     Allergies  Eggs or egg-derived products  Home Medications   Prior to Admission medications   Medication Sig Start Date End Date Taking? Authorizing Provider  amLODipine (NORVASC) 5 MG tablet Take 10 mg by mouth at bedtime.  11/24/14  Yes Historical  Provider, MD  b complex-vitamin c-folic acid (NEPHRO-VITE) 0.8 MG TABS Take 0.8 mg by mouth at bedtime.   Yes Historical Provider, MD  carvedilol (COREG) 3.125 MG tablet Take 3.125 mg by mouth 2 (two) times daily. 01/08/15  Yes Historical Provider, MD  FOSRENOL 1000 MG chewable tablet Chew 1,000 mg by mouth 5 (five) times daily. 01/06/15  Yes Historical Provider, MD  hydrALAZINE (APRESOLINE) 100 MG tablet Take 100 mg by mouth 2 (two) times daily.  11/05/14  Yes Historical Provider, MD  lisinopril (PRINIVIL,ZESTRIL) 40 MG tablet Take 40 mg by mouth daily.   Yes Historical Provider, MD  spironolactone (ALDACTONE) 25 MG tablet Take 25 mg by mouth at bedtime. 03/03/15  Yes Historical Provider, MD  benzonatate (TESSALON) 100 MG capsule Take 2 capsules (200 mg total) by mouth 2 (two) times daily as needed for cough. Patient not taking: Reported on 11/10/2014 10/20/13   Antony Madura, PA-C  bismuth subsalicylate (PEPTO-BISMOL) 262 MG/15ML suspension Take 30 mLs by mouth every 6 (six) hours as needed. 03/19/15   Jakiah Bienaime C Mckynzie Liwanag, PA-C  famotidine (PEPCID) 20 MG tablet Take 1 tablet (20 mg total) by mouth 2 (two) times daily. 03/19/15   Travell Desaulniers C Chasiti Waddington, PA-C  HYDROcodone-acetaminophen (NORCO) 5-325 MG tablet Take 1 tablet by mouth every 6 (six) hours as needed for severe pain. Patient not taking: Reported on 03/19/2015 02/15/15   Mercedes Camprubi-Soms, PA-C  HYDROcodone-acetaminophen (NORCO/VICODIN) 5-325 MG per tablet Take 2 tablets by mouth every 4 (four) hours as needed. Patient not taking: Reported on 01/12/2015 11/15/14   Danelle Berry, PA-C  ibuprofen (ADVIL,MOTRIN) 600 MG tablet Take 1 tablet (600 mg total) by mouth every 8 (eight) hours as needed. Patient not taking: Reported on 03/19/2015 02/07/15   Azalia Bilis, MD  lidocaine (LIDODERM) 5 % Place 1 patch onto the skin daily. Remove & Discard patch within 12 hours or as directed by MD Patient not taking: Reported on 03/19/2015 01/12/15   April Palumbo, MD  meloxicam (MOBIC)  7.5 MG tablet Take 1 tablet (7.5 mg total) by mouth daily. Patient not taking: Reported on 03/19/2015 01/12/15   April Palumbo, MD  methocarbamol (ROBAXIN) 500 MG tablet Take 2 tablets (1,000 mg total) by mouth 3 (three) times daily. Patient not taking: Reported on 03/19/2015 02/04/15   Gerhard Munch, MD  ondansetron (ZOFRAN) 4 MG tablet Take 1 tablet (4 mg total) by mouth every 6 (six) hours. Patient not taking: Reported on 03/19/2015 10/05/14   Junius Finner, PA-C  PROAIR HFA 108 (90 BASE) MCG/ACT inhaler Inhale 1 puff into the lungs every 4 (four) hours as needed for wheezing. Wheezing 09/22/14   Historical Provider, MD  ranitidine (ZANTAC) 150 MG capsule Take 1 capsule (150 mg total) by mouth daily. 03/19/15   Kolston Lacount C Indiana Pechacek, PA-C   BP 94/79 mmHg  Pulse 63  Temp(Src) 97.4 F (36.3 C) (Oral)  Resp 15  SpO2 100% Physical Exam  Constitutional: He appears well-developed and well-nourished. No distress.  HENT:  Head: Normocephalic and atraumatic.  Eyes: Conjunctivae are normal. Pupils are equal, round, and reactive to light.  Cardiovascular: Normal rate, regular rhythm, normal heart sounds and intact distal pulses.   No peripheral edema.  Pulmonary/Chest: Effort normal and breath sounds normal. No respiratory distress.  Abdominal: Soft. Normal appearance and bowel sounds are normal. He exhibits no distension and no mass. There is tenderness in the epigastric area. There is guarding. There is no CVA tenderness.  Musculoskeletal: He exhibits no edema or tenderness.  Neurological: He is alert.  Skin: Skin is warm and dry. He is not diaphoretic.  Nursing note and vitals reviewed.   ED Course  Procedures (including critical care time) Labs Review Labs Reviewed  CBC WITH DIFFERENTIAL/PLATELET - Abnormal; Notable for the following:    Platelets 68 (*)    All other components within normal limits  COMPREHENSIVE METABOLIC PANEL - Abnormal; Notable for the following:    Chloride 99 (*)    BUN 52  (*)    Creatinine, Ser 9.25 (*)    Albumin 3.0 (*)    AST 49 (*)    Alkaline Phosphatase 151 (*)    GFR calc non Af Amer 5 (*)    GFR calc Af Amer 6 (*)    All other components within normal limits    Imaging Review Ct Abdomen Pelvis W Contrast  03/19/2015  CLINICAL DATA:  Upper abdominal pain, nausea and vomiting. History of hepatitis-C, end-stage renal disease, diabetes, hypertension and prior cholecystectomy. EXAM: CT ABDOMEN AND PELVIS WITH CONTRAST TECHNIQUE: Multidetector CT imaging of the abdomen and pelvis was performed using the standard protocol following bolus administration of intravenous contrast. CONTRAST:  80mL OMNIPAQUE IOHEXOL 300 MG/ML  SOLN COMPARISON:  10/05/2014 FINDINGS: Visualized lung bases show moderate cardiac enlargement and prominent caliber of pulmonary vessels. No visualized pleural effusions. The liver shows stable evidence of cirrhosis. No evidence of hepatic masses or biliary ductal dilatation. The gallbladder has been removed. There is suggestion of wall thickening involving the distal stomach and proximal duodenum at the level of the antrum, pylorus and duodenum bulb. Some surrounding stranding is noted in the adjacent fat as well as suggestion of edema/ fluid medial to the duodenum and abutting the lateral margin of the pancreatic head. Findings are suggestive of peptic ulcer disease. A component of pancreatitis cannot be excluded by imaging based on surrounding edema abutting the pancreatic head. No evidence of pancreatic necrosis, pseudocyst or abscess. The spleen is moderately enlarged. The kidney shows stable atrophy consistent with known end-stage renal disease. No evidence of bowel obstruction. Mildly thickened appearance of the colon again noted which may be largely chronic and secondary to chronic disease. Component of colitis cannot be excluded. There is no evidence of free air or focal abscess. No enlarged lymph nodes. No hernias are identified. The bladder  is unremarkable and decompressed. IMPRESSION: 1. Suggestion wall thickening involving the distal stomach and proximal duodenum with surrounding edema and inflammatory changes. Findings are suggestive of peptic ulcer disease without overt perforation. 2. Edema also abuts the pancreatic head and a component of pancreatitis cannot be excluded by CT. No focal abscess identified. 3. Stable evidence of hepatic cirrhosis. 4. Stable thickened appearance of the colon which may be a largely chronic appearance and secondary to chronic disease. Component of colitis cannot be excluded. No evidence of bowel obstruction or perforation. Electronically Signed   By: Sherrine Maples  Fredia SorrowYamagata M.D.   On: 03/19/2015 12:21   I have personally reviewed and evaluated these images and lab results as part of my medical decision-making.   EKG Interpretation   Date/Time:  Friday March 19 2015 10:07:52 EDT Ventricular Rate:  67 PR Interval:  224 QRS Duration: 158 QT Interval:  498 QTC Calculation: 526 R Axis:   -58 Text Interpretation:  Sinus rhythm Prolonged PR interval IVCD, consider  atypical RBBB Left ventricular hypertrophy No significant change since  last tracing Confirmed by Mirian MoGentry, Matthew [phone]([id number]) on 03/19/2015 12:48:23 PM      MDM   Final diagnoses:  Non-intractable vomiting with nausea, vomiting of unspecified type  Diarrhea, unspecified type  Peptic ulcer disease    Jillene BucksMichael A Friesenhahn presents with epigastric abdominal pain, nausea, vomiting, and diarrhea.  Findings and plan of care discussed with Mirian MoMatthew Gentry, MD.  Plan to obtain labs, pain medicine, Zofran, Abdominal CT, and discharge patient for dialysis appointment. [facility] and his appointment is at 1140 this morning. CMP results consistent with previous CMP values. CBC reveals platelet count of 68 which is down from 94 4 months ago. 12:21 PM patient states he feels much better and was pretty much back to  normal. States his appetite has returned and would like something to eat. Patient was told that it was recommended that he still go to dialysis after discharge from the ED today, but patient replied that he would not have time to complete his dialysis and he would plan on going tomorrow morning. Patient states his abdominal pain is relieved. Plan to give patient some food and discharge to home. Plan of care communicated with patient, patient agrees to the plan. CT scan shows evidence of possible peptic ulcer disease without perforation. Will discharge patient with PPI, H2 blocker, and bismuth prescriptions.    Anselm PancoastShawn C Camari Quintanilla, PA-C 03/19/15 1250  Mirian MoMatthew Gentry, MD 03/20/15 [phone]

## 2015-03-19 NOTE — ED Notes (Signed)
Patient returned from CT

## 2015-03-19 NOTE — ED Notes (Signed)
Per Pt, Pt reports eating cereal this morning when he started to have abdominal pain. Pt reports episodes of nausea, vomiting, and diarrhea. Pt reports pain in the upper abdomen.

## 2015-03-19 NOTE — Discharge Instructions (Signed)
You have been seen today for nausea and vomiting. Your imaging and lab tests showed no abnormalities. Follow up with PCP as needed. Return to ED should symptoms worsen.

## 2015-03-19 NOTE — ED Notes (Signed)
Pt finished contrast and CT made aware '

## 2015-04-02 ENCOUNTER — Encounter (HOSPITAL_COMMUNITY): Payer: Self-pay

## 2015-04-02 ENCOUNTER — Emergency Department (HOSPITAL_COMMUNITY): Payer: Medicare Other

## 2015-04-02 ENCOUNTER — Emergency Department (HOSPITAL_COMMUNITY)
Admission: EM | Admit: 2015-04-02 | Discharge: 2015-04-02 | Disposition: A | Discharge status: Home / Self Care | Payer: Medicare Other | Attending: Emergency Medicine | Admitting: Emergency Medicine

## 2015-04-02 DIAGNOSIS — E119 Type 2 diabetes mellitus without complications: Secondary | ICD-10-CM | POA: Diagnosis not present

## 2015-04-02 DIAGNOSIS — Z8669 Personal history of other diseases of the nervous system and sense organs: Secondary | ICD-10-CM | POA: Diagnosis not present

## 2015-04-02 DIAGNOSIS — I252 Old myocardial infarction: Secondary | ICD-10-CM | POA: Insufficient documentation

## 2015-04-02 DIAGNOSIS — K219 Gastro-esophageal reflux disease without esophagitis: Secondary | ICD-10-CM | POA: Insufficient documentation

## 2015-04-02 DIAGNOSIS — R51 Headache: Secondary | ICD-10-CM | POA: Insufficient documentation

## 2015-04-02 DIAGNOSIS — J40 Bronchitis, not specified as acute or chronic: Secondary | ICD-10-CM | POA: Insufficient documentation

## 2015-04-02 DIAGNOSIS — Z79899 Other long term (current) drug therapy: Secondary | ICD-10-CM | POA: Diagnosis not present

## 2015-04-02 DIAGNOSIS — I120 Hypertensive chronic kidney disease with stage 5 chronic kidney disease or end stage renal disease: Secondary | ICD-10-CM | POA: Diagnosis not present

## 2015-04-02 DIAGNOSIS — Z8619 Personal history of other infectious and parasitic diseases: Secondary | ICD-10-CM | POA: Insufficient documentation

## 2015-04-02 DIAGNOSIS — Z791 Long term (current) use of non-steroidal anti-inflammatories (NSAID): Secondary | ICD-10-CM | POA: Diagnosis not present

## 2015-04-02 DIAGNOSIS — D649 Anemia, unspecified: Secondary | ICD-10-CM | POA: Diagnosis not present

## 2015-04-02 DIAGNOSIS — N186 End stage renal disease: Secondary | ICD-10-CM | POA: Diagnosis not present

## 2015-04-02 DIAGNOSIS — I509 Heart failure, unspecified: Secondary | ICD-10-CM | POA: Insufficient documentation

## 2015-04-02 DIAGNOSIS — R0602 Shortness of breath: Secondary | ICD-10-CM | POA: Diagnosis present

## 2015-04-02 DIAGNOSIS — F17210 Nicotine dependence, cigarettes, uncomplicated: Secondary | ICD-10-CM | POA: Insufficient documentation

## 2015-04-02 DIAGNOSIS — Z992 Dependence on renal dialysis: Secondary | ICD-10-CM | POA: Diagnosis not present

## 2015-04-02 LAB — COMPREHENSIVE METABOLIC PANEL
ALBUMIN: 2.7 g/dL — AB (ref 3.5–5.0)
ALK PHOS: 164 U/L — AB (ref 38–126)
ALT: 31 U/L (ref 17–63)
ANION GAP: 15 (ref 5–15)
AST: 46 U/L — ABNORMAL HIGH (ref 15–41)
BILIRUBIN TOTAL: 0.7 mg/dL (ref 0.3–1.2)
BUN: 57 mg/dL — ABNORMAL HIGH (ref 6–20)
CALCIUM: 8.5 mg/dL — AB (ref 8.9–10.3)
CO2: 21 mmol/L — ABNORMAL LOW (ref 22–32)
Chloride: 99 mmol/L — ABNORMAL LOW (ref 101–111)
Creatinine, Ser: 8.71 mg/dL — ABNORMAL HIGH (ref 0.61–1.24)
GFR, EST AFRICAN AMERICAN: 7 mL/min — AB (ref >60)
GFR, EST NON AFRICAN AMERICAN: 6 mL/min — AB (ref >60)
Glucose, Bld: 175 mg/dL — ABNORMAL HIGH (ref 65–99)
POTASSIUM: 4.3 mmol/L (ref 3.5–5.1)
Sodium: 135 mmol/L (ref 135–145)
TOTAL PROTEIN: 6.5 g/dL (ref 6.5–8.1)

## 2015-04-02 LAB — CBC WITH DIFFERENTIAL/PLATELET
Basophils Absolute: 0.1 10*3/uL (ref 0.0–0.1)
Basophils Relative: 1 %
Eosinophils Absolute: 0.9 10*3/uL — ABNORMAL HIGH (ref 0.0–0.7)
Eosinophils Relative: 14 %
HEMATOCRIT: 34.8 % — AB (ref 39.0–52.0)
HEMOGLOBIN: 11.8 g/dL — AB (ref 13.0–17.0)
LYMPHS ABS: 0.9 10*3/uL (ref 0.7–4.0)
Lymphocytes Relative: 15 %
MCH: 32.2 pg (ref 26.0–34.0)
MCHC: 33.9 g/dL (ref 30.0–36.0)
MCV: 94.8 fL (ref 78.0–100.0)
MONO ABS: 0.5 10*3/uL (ref 0.1–1.0)
MONOS PCT: 8 %
NEUTROS ABS: 3.7 10*3/uL (ref 1.7–7.7)
Neutrophils Relative %: 62 %
Platelets: 73 10*3/uL — ABNORMAL LOW (ref 150–400)
RBC: 3.67 MIL/uL — ABNORMAL LOW (ref 4.22–5.81)
RDW: 14.9 % (ref 11.5–15.5)
WBC: 5.9 10*3/uL (ref 4.0–10.5)

## 2015-04-02 LAB — I-STAT TROPONIN, ED: TROPONIN I, POC: 0.05 ng/mL (ref 0.00–0.08)

## 2015-04-02 MED ORDER — BENZONATATE 100 MG PO CAPS: 100.0000 mg | ORAL_CAPSULE | Freq: Three times a day (TID) | ORAL | Status: DC

## 2015-04-02 MED ORDER — PREDNISONE 20 MG PO TABS
60.0000 mg | ORAL_TABLET | Freq: Once | ORAL | Status: AC
  Administered 2015-04-02: 60 mg via ORAL
  Filled 2015-04-02: qty 3

## 2015-04-02 MED ORDER — IPRATROPIUM-ALBUTEROL 0.5-2.5 (3) MG/3ML IN SOLN
3.0000 mL | Freq: Once | RESPIRATORY_TRACT | Status: AC
  Administered 2015-04-02: 3 mL via RESPIRATORY_TRACT
  Filled 2015-04-02: qty 3

## 2015-04-02 MED ORDER — PREDNISONE 20 MG PO TABS: 40.0000 mg | ORAL_TABLET | Freq: Every day | ORAL | Status: DC

## 2015-04-02 NOTE — Discharge Instructions (Signed)
1. Medications: Take prednisone once daily for the next 5 days, Tessalon for cough as needed, use your home inhaler as needed for shortness of breath/wheezing, continue usual home medications 2. Treatment: rest, continue going to dialysis as scheduled.  3. Follow Up: Please follow up with your primary doctor in 3 days for discussion of your diagnoses and further evaluation after today's visit; Please return to the ER for worsening shortness of breath, new or worsening symptoms, any additional concerns.

## 2015-04-02 NOTE — ED Provider Notes (Signed)
CSN: [id number]     Arrival date & time 04/02/15  4098 History   None    Chief Complaint  Patient presents with  . Shortness of Breath  . Chest Pain     (Consider location/radiation/quality/duration/timing/severity/associated sxs/prior Treatment) Patient is a 59 y.o. male presenting with shortness of breath and chest pain. The history is provided by the patient and medical records. No language interpreter was used.  Shortness of Breath Associated symptoms: chest pain, cough, headaches and vomiting (Post-tussive)   Associated symptoms: no abdominal pain, no diaphoresis, no fever, no neck pain, no rash, no sore throat and no wheezing   Chest Pain Associated symptoms: cough, headache, nausea, shortness of breath and vomiting (Post-tussive)   Associated symptoms: no abdominal pain, no back pain, no diaphoresis, no dizziness, no fatigue, no fever, no palpitations and no weakness    [name] is a 59 y.o. male  with a PMH of HTN, GERD, Hep C, DM, ESRD on dialysis who presents to the Emergency Department complaining of productive cough, shortness of breath, and sharp, intermittent chest pain along chest wall worse with breathing x 2-3 days. Pt. States sxs are worsening. Associated symptoms include headache, nausea, post-tussive emesis, and diarrhea. Pt. Denies fever. Has tried Robitussin with mild relief.   Of note, patient is on dialysis MWF. Last dialysis on Wednesday; has appointment today "around 9 or 10"   Past Medical History  Diagnosis Date  . Myocardial infarction (HCC)   . Hypertension   . GERD (gastroesophageal reflux disease)   . Pulmonary edema     Just prior to starting HD and resolved with HD. ECHO in 2008 showed normal LVEF, mild diiastolic dysfunction.   . Neuropathy (HCC)     Hx: of in toes  . Hepatitis     Hc: of Hep C  . Cataracts, both eyes     Hx: of "slightly"  . Pancreatitis     Hx: of  . CHF (congestive heart failure) (HCC)   . Diabetes mellitus    not on medication  . 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  Started HD in 2007, gets HD at [facility] on MWF schedule.     Past Surgical History  Procedure Laterality Date  . Dialysis fistula creation Left   . Cholecystectomy    . Colonoscopy      Hx: of  . Ligation of arteriovenous  fistula Left 11/06/2012    Procedure: PLICATION OF BRACHIO-CEPHALIC ARTERIOVENOUS  FISTULA;  Surgeon: Fransisco Hertz, MD;  Location: [facility] OR;  Service: Vascular;  Laterality: Left;  . Pars plana vitrectomy Right 11/10/2014    Procedure: PARS PLANA VITRECTOMY WITH 25 GAUGE;  Surgeon: Sherrie George, MD;  Location: [facility] OR;  Service: Ophthalmology;  Laterality: Right;  . Membrane peel Right 11/10/2014    Procedure: MEMBRANE PEEL;  Surgeon: Sherrie George, MD;  Location: [facility] OR;  Service: Ophthalmology;  Laterality: Right;  . Gas/fluid exchange Right 11/10/2014    Procedure: GAS/FLUID EXCHANGE;  Surgeon: Sherrie George, MD;  Location: [facility] OR;  Service: Ophthalmology;  Laterality: Right;  . Laser photo ablation Right 11/10/2014    Procedure: LASER PHOTO ABLATION;  Surgeon: Sherrie George, MD;  Location: [facility] OR;  Service: Ophthalmology;  Laterality: Right;  Endo Laser   Family History  Problem Relation Age of Onset  . Diabetes Mother    Social History  Substance Use Topics  . Smoking  status: Current Every Day Smoker -- 0.35 packs/day for 43 years    Types: Cigarettes    Last Attempt to Quit: 12/16/2012  . Smokeless tobacco: Never Used  . Alcohol Use: No    Review of Systems  Constitutional: Positive for chills. Negative for fever, diaphoresis, activity change, appetite change, fatigue and unexpected weight change.  HENT: Negative for rhinorrhea and sore throat.   Eyes: Negative for visual disturbance.  Respiratory: Positive for cough, chest tightness and shortness of breath. Negative for wheezing and stridor.   Cardiovascular: Positive for chest pain. Negative for palpitations  and leg swelling.  Gastrointestinal: Positive for nausea, vomiting (Post-tussive) and diarrhea. Negative for abdominal pain and constipation.  Musculoskeletal: Negative for myalgias, back pain, arthralgias and neck pain.  Skin: Negative for rash.  Neurological: Positive for headaches. Negative for dizziness and weakness.  Hematological: Does not bruise/bleed easily.      Allergies  Eggs or egg-derived products  Home Medications   Prior to Admission medications   Medication Sig Start Date End Date Taking? Authorizing Provider  amLODipine (NORVASC) 5 MG tablet Take 10 mg by mouth at bedtime.  11/24/14   Historical Provider, MD  b complex-vitamin c-folic acid (NEPHRO-VITE) 0.8 MG TABS Take 0.8 mg by mouth at bedtime.    Historical Provider, MD  benzonatate (TESSALON) 100 MG capsule Take 2 capsules (200 mg total) by mouth 2 (two) times daily as needed for cough. Patient not taking: Reported on 11/10/2014 10/20/13   Antony Madura, PA-C  bismuth subsalicylate (PEPTO-BISMOL) 262 MG/15ML suspension Take 30 mLs by mouth every 6 (six) hours as needed. 03/19/15   Shawn C Joy, PA-C  carvedilol (COREG) 3.125 MG tablet Take 3.125 mg by mouth 2 (two) times daily. 01/08/15   Historical Provider, MD  famotidine (PEPCID) 20 MG tablet Take 1 tablet (20 mg total) by mouth 2 (two) times daily. 03/19/15   Shawn C Joy, PA-C  FOSRENOL 1000 MG chewable tablet Chew 1,000 mg by mouth 5 (five) times daily. 01/06/15   Historical Provider, MD  hydrALAZINE (APRESOLINE) 100 MG tablet Take 100 mg by mouth 2 (two) times daily.  11/05/14   Historical Provider, MD  HYDROcodone-acetaminophen (NORCO) 5-325 MG tablet Take 1 tablet by mouth every 6 (six) hours as needed for severe pain. Patient not taking: Reported on 03/19/2015 02/15/15   Mercedes Camprubi-Soms, PA-C  HYDROcodone-acetaminophen (NORCO/VICODIN) 5-325 MG per tablet Take 2 tablets by mouth every 4 (four) hours as needed. Patient not taking: Reported on 01/12/2015 11/15/14    Danelle Berry, PA-C  ibuprofen (ADVIL,MOTRIN) 600 MG tablet Take 1 tablet (600 mg total) by mouth every 8 (eight) hours as needed. Patient not taking: Reported on 03/19/2015 02/07/15   Azalia Bilis, MD  lidocaine (LIDODERM) 5 % Place 1 patch onto the skin daily. Remove & Discard patch within 12 hours or as directed by MD Patient not taking: Reported on 03/19/2015 01/12/15   April Palumbo, MD  lisinopril (PRINIVIL,ZESTRIL) 40 MG tablet Take 40 mg by mouth daily.    Historical Provider, MD  meloxicam (MOBIC) 7.5 MG tablet Take 1 tablet (7.5 mg total) by mouth daily. Patient not taking: Reported on 03/19/2015 01/12/15   April Palumbo, MD  methocarbamol (ROBAXIN) 500 MG tablet Take 2 tablets (1,000 mg total) by mouth 3 (three) times daily. Patient not taking: Reported on 03/19/2015 02/04/15   Gerhard Munch, MD  ondansetron (ZOFRAN) 4 MG tablet Take 1 tablet (4 mg total) by mouth every 6 (six) hours. Patient not taking: Reported  on 03/19/2015 10/05/14   Junius FinnerErin O'Malley, PA-C  PROAIR HFA 108 (90 BASE) MCG/ACT inhaler Inhale 1 puff into the lungs every 4 (four) hours as needed for wheezing. Wheezing 09/22/14   Historical Provider, MD  ranitidine (ZANTAC) 150 MG capsule Take 1 capsule (150 mg total) by mouth daily. 03/19/15   Shawn C Joy, PA-C  spironolactone (ALDACTONE) 25 MG tablet Take 25 mg by mouth at bedtime. 03/03/15   Historical Provider, MD   There were no vitals taken for this visit. Physical Exam  Constitutional: He is oriented to person, place, and time. He appears well-developed and well-nourished.  Alert and in no acute distress  HENT:  Head: Normocephalic and atraumatic.  Nose: Nose normal. No mucosal edema or rhinorrhea. Right sinus exhibits no maxillary sinus tenderness and no frontal sinus tenderness. Left sinus exhibits no maxillary sinus tenderness and no frontal sinus tenderness.  Mouth/Throat: Uvula is midline, oropharynx is clear and moist and mucous membranes are normal. No oropharyngeal  exudate, posterior oropharyngeal edema or posterior oropharyngeal erythema.  Cardiovascular: Normal rate, regular rhythm, normal heart sounds and intact distal pulses.  Exam reveals no gallop and no friction rub.   No murmur heard. Fistula of right arm with palpable thrill  Pulmonary/Chest: No respiratory distress.  Bilateral inspiratory and expiratory wheezing.  Coughing fits with deep breaths Mild chest tenderness No crackles appreciated.   Abdominal: He exhibits no mass. There is no rebound and no guarding.  Abdomen soft, non-distended Mild epigastric tenderness to palpation Bowel sounds positive in all four quadrants  Musculoskeletal: He exhibits no edema.  Lymphadenopathy:    He has no cervical adenopathy.  Neurological: He is alert and oriented to person, place, and time.  Skin: Skin is warm and dry. No rash noted.  Psychiatric: He has a normal mood and affect. His behavior is normal. Judgment and thought content normal.  Nursing note and vitals reviewed.   ED Course  Procedures (including critical care time) Labs Review Labs Reviewed - No data to display  Imaging Review No results found. I have personally reviewed and evaluated these images and lab results as part of my medical decision-making.   EKG Interpretation None      MDM   Final diagnoses:  None   [name] presents with worsening shortness of breath and pleuritic chest pain x 3 days.   Labs: Trop 0.05; H&H of 11.8/24.78 which is consistent with baseline values; CMP consistent with baseline CXR show no consolidation, pneumothorax, or effusion.   Patient does not appear volume overloaded, no fever, nl white count, O2 96-100% throughout ED visit.  7:47 AM: Patient re-evaluated and lung exam is much improved. Will give first dose of steroids here, then dc home with burst for 5 days. Pt. States he has enough of home inhaler. Discussed when/if to return to ER and stressed the importance of making his  dialysis appointment this morning.  Patient seen by and discussed with Dr. Patria Maneampos who agrees with treatment plan.    [facility] Surgery Ctr LLCJaime Pilcher Ward, PA-C 04/02/15 [id number]  Azalia BilisKevin Campos, MD 04/02/15 [phone]

## 2015-04-02 NOTE — ED Notes (Signed)
Pt complains of SOB starting five days ago with a cold, Chest Pain starting yesterday and is generalized. Pt denies N/V

## 2015-04-09 LAB — NM MYOCAR MULTI W/SPECT W/WALL MOTION / EF
CHL CUP NUCLEAR SRS: 0
LV dias vol: 148 mL
LV sys vol: 82 mL
Nuc Stress EF: 44 %
RATE: 0.28
SDS: 1
SSS: 1
TID: 0.91

## 2015-04-12 ENCOUNTER — Encounter (HOSPITAL_COMMUNITY): Payer: Self-pay | Admitting: Family Medicine

## 2015-04-12 ENCOUNTER — Emergency Department (HOSPITAL_COMMUNITY): Payer: Medicare Other

## 2015-04-12 ENCOUNTER — Emergency Department (HOSPITAL_COMMUNITY)
Admission: EM | Admit: 2015-04-12 | Discharge: 2015-04-12 | Disposition: A | Discharge status: Home / Self Care | Payer: Medicare Other | Attending: Emergency Medicine | Admitting: Emergency Medicine

## 2015-04-12 DIAGNOSIS — Y9289 Other specified places as the place of occurrence of the external cause: Secondary | ICD-10-CM | POA: Diagnosis not present

## 2015-04-12 DIAGNOSIS — Z992 Dependence on renal dialysis: Secondary | ICD-10-CM | POA: Insufficient documentation

## 2015-04-12 DIAGNOSIS — Y998 Other external cause status: Secondary | ICD-10-CM | POA: Diagnosis not present

## 2015-04-12 DIAGNOSIS — J209 Acute bronchitis, unspecified: Secondary | ICD-10-CM | POA: Insufficient documentation

## 2015-04-12 DIAGNOSIS — N186 End stage renal disease: Secondary | ICD-10-CM | POA: Insufficient documentation

## 2015-04-12 DIAGNOSIS — Z8619 Personal history of other infectious and parasitic diseases: Secondary | ICD-10-CM | POA: Insufficient documentation

## 2015-04-12 DIAGNOSIS — Z79899 Other long term (current) drug therapy: Secondary | ICD-10-CM | POA: Insufficient documentation

## 2015-04-12 DIAGNOSIS — K219 Gastro-esophageal reflux disease without esophagitis: Secondary | ICD-10-CM | POA: Insufficient documentation

## 2015-04-12 DIAGNOSIS — S99912A Unspecified injury of left ankle, initial encounter: Secondary | ICD-10-CM | POA: Diagnosis not present

## 2015-04-12 DIAGNOSIS — Z7952 Long term (current) use of systemic steroids: Secondary | ICD-10-CM | POA: Diagnosis not present

## 2015-04-12 DIAGNOSIS — I252 Old myocardial infarction: Secondary | ICD-10-CM | POA: Insufficient documentation

## 2015-04-12 DIAGNOSIS — I120 Hypertensive chronic kidney disease with stage 5 chronic kidney disease or end stage renal disease: Secondary | ICD-10-CM | POA: Diagnosis not present

## 2015-04-12 DIAGNOSIS — Z8669 Personal history of other diseases of the nervous system and sense organs: Secondary | ICD-10-CM | POA: Insufficient documentation

## 2015-04-12 DIAGNOSIS — Y9389 Activity, other specified: Secondary | ICD-10-CM | POA: Insufficient documentation

## 2015-04-12 DIAGNOSIS — F17210 Nicotine dependence, cigarettes, uncomplicated: Secondary | ICD-10-CM | POA: Diagnosis not present

## 2015-04-12 DIAGNOSIS — H269 Unspecified cataract: Secondary | ICD-10-CM | POA: Insufficient documentation

## 2015-04-12 DIAGNOSIS — R05 Cough: Secondary | ICD-10-CM | POA: Diagnosis present

## 2015-04-12 DIAGNOSIS — E119 Type 2 diabetes mellitus without complications: Secondary | ICD-10-CM | POA: Insufficient documentation

## 2015-04-12 DIAGNOSIS — I509 Heart failure, unspecified: Secondary | ICD-10-CM | POA: Diagnosis not present

## 2015-04-12 DIAGNOSIS — W1839XA Other fall on same level, initial encounter: Secondary | ICD-10-CM | POA: Insufficient documentation

## 2015-04-12 DIAGNOSIS — J40 Bronchitis, not specified as acute or chronic: Secondary | ICD-10-CM

## 2015-04-12 DIAGNOSIS — J329 Chronic sinusitis, unspecified: Secondary | ICD-10-CM | POA: Diagnosis not present

## 2015-04-12 MED ORDER — DEXAMETHASONE 2 MG PO TABS: 10.0000 mg | ORAL_TABLET | Freq: Two times a day (BID) | ORAL | Status: DC

## 2015-04-12 MED ORDER — ALBUTEROL SULFATE (2.5 MG/3ML) 0.083% IN NEBU
5.0000 mg | INHALATION_SOLUTION | Freq: Once | RESPIRATORY_TRACT | Status: AC
  Administered 2015-04-12: 5 mg via RESPIRATORY_TRACT
  Filled 2015-04-12: qty 6

## 2015-04-12 MED ORDER — IPRATROPIUM BROMIDE 0.02 % IN SOLN
0.5000 mg | Freq: Once | RESPIRATORY_TRACT | Status: AC
  Administered 2015-04-12: 0.5 mg via RESPIRATORY_TRACT
  Filled 2015-04-12: qty 2.5

## 2015-04-12 MED ORDER — KETOROLAC TROMETHAMINE 30 MG/ML IJ SOLN
30.0000 mg | Freq: Once | INTRAMUSCULAR | Status: AC
  Administered 2015-04-12: 30 mg via INTRAMUSCULAR
  Filled 2015-04-12: qty 1

## 2015-04-12 MED ORDER — DOXYCYCLINE HYCLATE 100 MG PO CAPS: 100.0000 mg | ORAL_CAPSULE | Freq: Two times a day (BID) | ORAL | Status: DC

## 2015-04-12 MED ORDER — ALBUTEROL SULFATE HFA 108 (90 BASE) MCG/ACT IN AERS: 1.0000 | INHALATION_SPRAY | RESPIRATORY_TRACT | Status: DC | PRN

## 2015-04-12 MED ORDER — ALBUTEROL SULFATE (2.5 MG/3ML) 0.083% IN NEBU: 5.0000 mg | INHALATION_SOLUTION | Freq: Once | RESPIRATORY_TRACT | Status: DC

## 2015-04-12 MED ORDER — DEXAMETHASONE 4 MG PO TABS
10.0000 mg | ORAL_TABLET | Freq: Once | ORAL | Status: AC
  Administered 2015-04-12: 10 mg via ORAL
  Filled 2015-04-12: qty 3

## 2015-04-12 NOTE — ED Notes (Signed)
Pt at nursing station asking for paper work and saying he wants to leave. MD spoke to pt, pt to be dis charged

## 2015-04-12 NOTE — Discharge Instructions (Signed)
Ankle Sprain °An ankle sprain is an injury to the strong, fibrous tissues (ligaments) that hold the bones of your ankle joint together.  °CAUSES °An ankle sprain is usually caused by a fall or by twisting your ankle. Ankle sprains most commonly occur when you step on the outer edge of your foot, and your ankle turns inward. People who participate in sports are more prone to these types of injuries.  °SYMPTOMS  °· Pain in your ankle. The pain may be present at rest or only when you are trying to stand or walk. °· Swelling. °· Bruising. Bruising may develop immediately or within 1 to 2 days after your injury. °· Difficulty standing or walking, particularly when turning corners or changing directions. °DIAGNOSIS  °Your caregiver will ask you details about your injury and perform a physical exam of your ankle to determine if you have an ankle sprain. During the physical exam, your caregiver will press on and apply pressure to specific areas of your foot and ankle. Your caregiver will try to move your ankle in certain ways. An X-ray exam may be done to be sure a bone was not broken or a ligament did not separate from one of the bones in your ankle (avulsion fracture).  °TREATMENT  °Certain types of braces can help stabilize your ankle. Your caregiver can make a recommendation for this. Your caregiver may recommend the use of medicine for pain. If your sprain is severe, your caregiver may refer you to a surgeon who helps to restore function to parts of your skeletal system (orthopedist) or a physical therapist. °HOME CARE INSTRUCTIONS  °· Apply ice to your injury for 1-2 days or as directed by your caregiver. Applying ice helps to reduce inflammation and pain. °¨ Put ice in a plastic bag. °¨ Place a towel between your skin and the bag. °¨ Leave the ice on for 15-20 minutes at a time, every 2 hours while you are awake. °· Only take over-the-counter or prescription medicines for pain, discomfort, or fever as directed by  your caregiver. °· Elevate your injured ankle above the level of your heart as much as possible for 2-3 days. °· If your caregiver recommends crutches, use them as instructed. Gradually put weight on the affected ankle. Continue to use crutches or a cane until you can walk without feeling pain in your ankle. °· If you have a plaster splint, wear the splint as directed by your caregiver. Do not rest it on anything harder than a pillow for the first 24 hours. Do not put weight on it. Do not get it wet. You may take it off to take a shower or bath. °· You may have been given an elastic bandage to wear around your ankle to provide support. If the elastic bandage is too tight (you have numbness or tingling in your foot or your foot becomes cold and blue), adjust the bandage to make it comfortable. °· If you have an air splint, you may blow more air into it or let air out to make it more comfortable. You may take your splint off at night and before taking a shower or bath. Wiggle your toes in the splint several times per day to decrease swelling. °SEEK MEDICAL CARE IF:  °· You have rapidly increasing bruising or swelling. °· Your toes feel extremely cold or you lose feeling in your foot. °· Your pain is not relieved with medicine. °SEEK IMMEDIATE MEDICAL CARE IF: °· Your toes are numb or blue. °·   You have severe pain that is increasing. MAKE SURE YOU:   Understand these instructions.  Will watch your condition.  Will get help right away if you are not doing well or get worse.   This information is not intended to replace advice given to you by your health care provider. Make sure you discuss any questions you have with your health care provider.   Document Released: 05/01/2005 Document Revised: 05/22/2014 Document Reviewed: 05/13/2011 Elsevier Interactive Patient Education 2016 [employer]   Acute Bronchitis Bronchitis is inflammation of the airways that extend from the windpipe into the lungs  (bronchi). The inflammation often causes mucus to develop. This leads to a cough, which is the most common symptom of bronchitis.  In acute bronchitis, the condition usually develops suddenly and goes away over time, usually in a couple weeks. Smoking, allergies, and asthma can make bronchitis worse. Repeated episodes of bronchitis may cause further lung problems.  CAUSES Acute bronchitis is most often caused by the same virus that causes a cold. The virus can spread from person to person (contagious) through coughing, sneezing, and touching contaminated objects. SIGNS AND SYMPTOMS   Cough.   Fever.   Coughing up mucus.   Body aches.   Chest congestion.   Chills.   Shortness of breath.   Sore throat.  DIAGNOSIS  Acute bronchitis is usually diagnosed through a physical exam. Your health care provider will also ask you questions about your medical history. Tests, such as chest X-rays, are sometimes done to rule out other conditions.  TREATMENT  Acute bronchitis usually goes away in a couple weeks. Oftentimes, no medical treatment is necessary. Medicines are sometimes given for relief of fever or cough. Antibiotic medicines are usually not needed but may be prescribed in certain situations. In some cases, an inhaler may be recommended to help reduce shortness of breath and control the cough. A cool mist vaporizer may also be used to help thin bronchial secretions and make it easier to clear the chest.  HOME CARE INSTRUCTIONS  Get plenty of rest.   Drink enough fluids to keep your urine clear or pale yellow (unless you have a medical condition that requires fluid restriction). Increasing fluids may help thin your respiratory secretions (sputum) and reduce chest congestion, and it will prevent dehydration.   Take medicines only as directed by your health care provider.  If you were prescribed an antibiotic medicine, finish it all even if you start to feel better.  Avoid  smoking and secondhand smoke. Exposure to cigarette smoke or irritating chemicals will make bronchitis worse. If you are a smoker, consider using nicotine gum or skin patches to help control withdrawal symptoms. Quitting smoking will help your lungs heal faster.   Reduce the chances of another bout of acute bronchitis by washing your hands frequently, avoiding people with cold symptoms, and trying not to touch your hands to your mouth, nose, or eyes.   Keep all follow-up visits as directed by your health care provider.  SEEK MEDICAL CARE IF: Your symptoms do not improve after 1 week of treatment.  SEEK IMMEDIATE MEDICAL CARE IF:  You develop an increased fever or chills.   You have chest pain.   You have severe shortness of breath.  You have bloody sputum.   You develop dehydration.  You faint or repeatedly feel like you are going to pass out.  You develop repeated vomiting.  You develop a severe headache. MAKE SURE YOU:   Understand these instructions.  Will watch your condition.  Will get help right away if you are not doing well or get worse.   This information is not intended to replace advice given to you by your health care provider. Make sure you discuss any questions you have with your health care provider.   Document Released: 06/08/2004 Document Revised: 05/22/2014 Document Reviewed: 10/22/2012 Elsevier Interactive Patient Education 2016 Elsevier Inc.   Sinusitis, Adult Sinusitis is redness, soreness, and inflammation of the paranasal sinuses. Paranasal sinuses are air pockets within the bones of your face. They are located beneath your eyes, in the middle of your forehead, and above your eyes. In healthy paranasal sinuses, mucus is able to drain out, and air is able to circulate through them by way of your nose. However, when your paranasal sinuses are inflamed, mucus and air can become trapped. This can allow bacteria and other germs to grow and cause  infection. Sinusitis can develop quickly and last only a short time (acute) or continue over a long period (chronic). Sinusitis that lasts for more than 12 weeks is considered chronic. CAUSES Causes of sinusitis include:  Allergies.  Structural abnormalities, such as displacement of the cartilage that separates your nostrils (deviated septum), which can decrease the air flow through your nose and sinuses and affect sinus drainage.  Functional abnormalities, such as when the small hairs (cilia) that line your sinuses and help remove mucus do not work properly or are not present. SIGNS AND SYMPTOMS Symptoms of acute and chronic sinusitis are the same. The primary symptoms are pain and pressure around the affected sinuses. Other symptoms include:  Upper toothache.  Earache.  Headache.  Bad breath.  Decreased sense of smell and taste.  A cough, which worsens when you are lying flat.  Fatigue.  Fever.  Thick drainage from your nose, which often is green and may contain pus (purulent).  Swelling and warmth over the affected sinuses. DIAGNOSIS Your health care provider will perform a physical exam. During your exam, your health care provider may perform any of the following to help determine if you have acute sinusitis or chronic sinusitis:  Look in your nose for signs of abnormal growths in your nostrils (nasal polyps).  Tap over the affected sinus to check for signs of infection.  View the inside of your sinuses using an imaging device that has a light attached (endoscope). If your health care provider suspects that you have chronic sinusitis, one or more of the following tests may be recommended:  Allergy tests.  Nasal culture. A sample of mucus is taken from your nose, sent to a lab, and screened for bacteria.  Nasal cytology. A sample of mucus is taken from your nose and examined by your health care provider to determine if your sinusitis is related to an  allergy. TREATMENT Most cases of acute sinusitis are related to a viral infection and will resolve on their own within 10 days. Sometimes, medicines are prescribed to help relieve symptoms of both acute and chronic sinusitis. These may include pain medicines, decongestants, nasal steroid sprays, or saline sprays. However, for sinusitis related to a bacterial infection, your health care provider will prescribe antibiotic medicines. These are medicines that will help kill the bacteria causing the infection. Rarely, sinusitis is caused by a fungal infection. In these cases, your health care provider will prescribe antifungal medicine. For some cases of chronic sinusitis, surgery is needed. Generally, these are cases in which sinusitis recurs more than 3 times per year, despite  other treatments. HOME CARE INSTRUCTIONS  Drink plenty of water. Water helps thin the mucus so your sinuses can drain more easily.  Use a humidifier.  Inhale steam 3-4 times a day (for example, sit in the bathroom with the shower running).  Apply a warm, moist washcloth to your face 3-4 times a day, or as directed by your health care provider.  Use saline nasal sprays to help moisten and clean your sinuses.  Take medicines only as directed by your health care provider.  If you were prescribed either an antibiotic or antifungal medicine, finish it all even if you start to feel better. SEEK IMMEDIATE MEDICAL CARE IF:  You have increasing pain or severe headaches.  You have nausea, vomiting, or drowsiness.  You have swelling around your face.  You have vision problems.  You have a stiff neck.  You have difficulty breathing.   This information is not intended to replace advice given to you by your health care provider. Make sure you discuss any questions you have with your health care provider.   Document Released: 05/01/2005 Document Revised: 05/22/2014 Document Reviewed: 05/16/2011 Elsevier Interactive Patient  Education [employer]

## 2015-04-12 NOTE — ED Notes (Signed)
Pt refusing vitals to be taken  

## 2015-04-12 NOTE — ED Notes (Signed)
Pt here for cough, congestion. Was seen here recently and given meds but lost prescription. Pt sts he just finished dialysis treatment PTA. sts also LLE pain and swelling since a fall 3 days ago.

## 2015-04-13 NOTE — ED Provider Notes (Signed)
CSN: [id number]     Arrival date & time 04/12/15  1618 History   First MD Initiated Contact with Patient 04/12/15 1942     Chief Complaint  Patient presents with  . Cough     (Consider location/radiation/quality/duration/timing/severity/associated sxs/prior Treatment) HPI Comments: 59 y.o. Male with history of MI, ESRD on dialysis, CHF, dialysis, everyday smoker presents for shortness of breath, cough, nasal congestion.  The patient says he was seen here recently for the same but lost the prescriptions they gave him.  Had dialysis today without complication.  He denies fever, chills, chest pain, leg swelling or edema.  He had a chest xray done in triage that was normal without sign of pulm edema or consolidation.  Patient requesting breathing treatment and stating he doesn't want to have to stay here long.  He also reports that about 3 days ago he fell twisting his ankle and falling on the left lower leg.  Reports ambulation but with pain.  Patient is a 59 y.o. male presenting with cough.  Cough Associated symptoms: headaches, rhinorrhea, shortness of breath and wheezing   Associated symptoms: no chest pain, no ear pain, no eye discharge, no myalgias, no rash and no sore throat     Past Medical History  Diagnosis Date  . Myocardial infarction (HCC)   . Hypertension   . GERD (gastroesophageal reflux disease)   . Pulmonary edema     Just prior to starting HD and resolved with HD. ECHO in 2008 showed normal LVEF, mild diiastolic dysfunction.   . Neuropathy (HCC)     Hx: of in toes  . Hepatitis     Hc: of Hep C  . Cataracts, both eyes     Hx: of "slightly"  . Pancreatitis     Hx: of  . CHF (congestive heart failure) (HCC)   . Diabetes mellitus     not on medication  . 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  Started HD in 2007, gets HD at [facility] on MWF schedule.     Past Surgical History  Procedure Laterality Date  . Dialysis fistula  creation Left   . Cholecystectomy    . Colonoscopy      Hx: of  . Ligation of arteriovenous  fistula Left 11/06/2012    Procedure: PLICATION OF BRACHIO-CEPHALIC ARTERIOVENOUS  FISTULA;  Surgeon: Fransisco Hertz, MD;  Location: [facility] OR;  Service: Vascular;  Laterality: Left;  . Pars plana vitrectomy Right 11/10/2014    Procedure: PARS PLANA VITRECTOMY WITH 25 GAUGE;  Surgeon: Sherrie George, MD;  Location: [facility] OR;  Service: Ophthalmology;  Laterality: Right;  . Membrane peel Right 11/10/2014    Procedure: MEMBRANE PEEL;  Surgeon: Sherrie George, MD;  Location: [facility] OR;  Service: Ophthalmology;  Laterality: Right;  . Gas/fluid exchange Right 11/10/2014    Procedure: GAS/FLUID EXCHANGE;  Surgeon: Sherrie George, MD;  Location: [facility] OR;  Service: Ophthalmology;  Laterality: Right;  . Laser photo ablation Right 11/10/2014    Procedure: LASER PHOTO ABLATION;  Surgeon: Sherrie George, MD;  Location: [facility] OR;  Service: Ophthalmology;  Laterality: Right;  Endo Laser   Family History  Problem Relation Age of Onset  . Diabetes Mother    Social History  Substance Use Topics  . Smoking status: Current Every Day Smoker -- 0.35 packs/day for 43 years    Types: Cigarettes    Last Attempt to Quit: 12/16/2012  .  Smokeless tobacco: Never Used  . Alcohol Use: No    Review of Systems  HENT: Positive for congestion, postnasal drip, rhinorrhea and sinus pressure. Negative for dental problem, ear discharge, ear pain and sore throat.   Eyes: Negative for pain, discharge and redness.  Respiratory: Positive for cough, shortness of breath and wheezing. Negative for chest tightness.   Cardiovascular: Negative for chest pain, palpitations and leg swelling.  Gastrointestinal: Negative for nausea, vomiting, abdominal pain, diarrhea and constipation.  Genitourinary: Negative for flank pain and penile pain.  Musculoskeletal: Positive for arthralgias (left ankle pain). Negative for myalgias and back pain.  Skin: Negative for  rash.  Neurological: Positive for headaches. Negative for dizziness.  Hematological: Negative for adenopathy.      Allergies  Eggs or egg-derived products  Home Medications   Prior to Admission medications   Medication Sig Start Date End Date Taking? Authorizing Provider  albuterol (PROVENTIL HFA;VENTOLIN HFA) 108 (90 BASE) MCG/ACT inhaler Inhale 1-2 puffs into the lungs every 4 (four) hours as needed for wheezing or shortness of breath (cough). 04/12/15   Leta Baptist, MD  amLODipine (NORVASC) 5 MG tablet Take 10 mg by mouth at bedtime.  11/24/14   Historical Provider, MD  b complex-vitamin c-folic acid (NEPHRO-VITE) 0.8 MG TABS Take 0.8 mg by mouth at bedtime.    Historical Provider, MD  benzonatate (TESSALON) 100 MG capsule Take 1 capsule (100 mg total) by mouth every 8 (eight) hours. 04/02/15   Chase Picket Ward, PA-C  bismuth subsalicylate (PEPTO-BISMOL) 262 MG/15ML suspension Take 30 mLs by mouth every 6 (six) hours as needed. 03/19/15   Shawn C Joy, PA-C  carvedilol (COREG) 3.125 MG tablet Take 3.125 mg by mouth 2 (two) times daily. 01/08/15   Historical Provider, MD  dexamethasone (DECADRON) 2 MG tablet Take 5 tablets (10 mg total) by mouth 2 (two) times daily with a meal. 04/12/15   Leta Baptist, MD  doxycycline (VIBRAMYCIN) 100 MG capsule Take 1 capsule (100 mg total) by mouth 2 (two) times daily. 04/12/15   Leta Baptist, MD  famotidine (PEPCID) 20 MG tablet Take 1 tablet (20 mg total) by mouth 2 (two) times daily. 03/19/15   Shawn C Joy, PA-C  FOSRENOL 1000 MG chewable tablet Chew 1,000 mg by mouth 5 (five) times daily. 01/06/15   Historical Provider, MD  hydrALAZINE (APRESOLINE) 100 MG tablet Take 100 mg by mouth 2 (two) times daily.  11/05/14   Historical Provider, MD  lisinopril (PRINIVIL,ZESTRIL) 40 MG tablet Take 40 mg by mouth daily.    Historical Provider, MD  predniSONE (DELTASONE) 20 MG tablet Take 2 tablets (40 mg total) by mouth daily. 04/02/15   Jaime Pilcher  Ward, PA-C  PROAIR HFA 108 (90 BASE) MCG/ACT inhaler Inhale 1 puff into the lungs every 4 (four) hours as needed for wheezing. Wheezing 09/22/14   Historical Provider, MD  ranitidine (ZANTAC) 150 MG capsule Take 1 capsule (150 mg total) by mouth daily. 03/19/15   Shawn C Joy, PA-C  spironolactone (ALDACTONE) 25 MG tablet Take 25 mg by mouth at bedtime. 03/03/15   Historical Provider, MD   BP 117/85 mmHg  Pulse 90  Temp(Src) 98.3 F (36.8 C) (Oral)  Resp 16  Ht  (1.778 m)  Wt 175 lb (79.379 kg)  BMI 25.11 kg/m2  SpO2 94% Physical Exam  Constitutional: He is oriented to person, place, and time. He appears well-developed and well-nourished. No distress.  HENT:  Head: Normocephalic and atraumatic.  Right Ear: Tympanic membrane and external ear normal.  Left Ear: Tympanic membrane and external ear normal.  Nose: Rhinorrhea present. Right sinus exhibits maxillary sinus tenderness and frontal sinus tenderness. Left sinus exhibits maxillary sinus tenderness and frontal sinus tenderness.  Mouth/Throat: Oropharynx is clear and moist. No oropharyngeal exudate, posterior oropharyngeal edema or posterior oropharyngeal erythema.  Poor transillumination of the sinuses.  Post nasal drip over the posterior pharynx  Eyes: EOM are normal. Pupils are equal, round, and reactive to light.  Neck: Normal range of motion. Neck supple.  Cardiovascular: Normal rate, regular rhythm and intact distal pulses.   Pulmonary/Chest: Effort normal. No respiratory distress. He has wheezes (bilateral, symmetric, moderate). He has no rales.  Abdominal: Soft. He exhibits no distension. There is no tenderness.  Musculoskeletal: He exhibits no edema.       Left ankle: He exhibits swelling. He exhibits normal range of motion, no ecchymosis and normal pulse. Tenderness. Lateral malleolus tenderness found. Achilles tendon normal.       Legs: Neurological: He is alert and oriented to person, place, and time.  Skin: Skin is  warm and dry. No rash noted. He is not diaphoretic.  Vitals reviewed.   ED Course  Procedures (including critical care time) Labs Review Labs Reviewed - No data to display  Imaging Review Dg Chest 2 View  04/12/2015  CLINICAL DATA:  Shortness of breath with cough and congestion; wheezing EXAM: CHEST  2 VIEW COMPARISON:  April 02, 2015 FINDINGS: There is no edema or consolidation. Heart is upper normal in size with pulmonary vascularity within normal limits. No adenopathy. No bone lesions. There is a stent in the left subclavian region. IMPRESSION: No edema or consolidation. Electronically Signed   By: Bretta BangWilliam  Woodruff III M.D.   On: 04/12/2015 17:51   Dg Tibia/fibula Left  04/12/2015  CLINICAL DATA:  Initial evaluation for acute trauma on, recent fall, now with pain and swelling. EXAM: LEFT TIBIA AND FIBULA - 2 VIEW COMPARISON:  None. FINDINGS: There is no evidence of fracture or other focal bone lesions. No acute soft tissue abnormality. Scattered vascular calcifications noted. IMPRESSION: No acute osseous abnormality about the left tibia/fibula. Electronically Signed   By: Rise MuBenjamin  McClintock M.D.   On: 04/12/2015 21:30   Dg Ankle Complete Left  04/12/2015  CLINICAL DATA:  Initial evaluation for acute trauma, fall, now with acute pain and swelling. EXAM: LEFT ANKLE COMPLETE - 3+ VIEW COMPARISON:  None. FINDINGS: No acute fracture or dislocation. Ankle mortise approximated. Talar dome intact. Mild disc diffuse soft tissue swelling about the ankle. Vascular calcifications present within the lower leg/ foot. Plantar calcaneal enthesophyte noted. IMPRESSION: 1. No acute osseous abnormality about the ankle. 2. Mild diffuse soft tissue swelling about the ankle. Electronically Signed   By: Rise MuBenjamin  McClintock M.D.   On: 04/12/2015 21:25   I have personally reviewed and evaluated these images and lab results as part of my medical decision-making.   EKG Interpretation None      MDM    Physical examination consistent with bronchitis/ sinus infection.  Symptoms >10 days, patient daily smoker.  Patient given two breathing treatments and decadron and toradol with improvement.  He was then standing at the desk requesting discharge.  Xrays all negative for acute process.  Patient discharged in stable condition with prescriptions for Albuterol inhaler, Decadron, doxycycline.  Patient expressed understanding and agreement with plan of care. Final diagnoses:  Sinusitis, unspecified chronicity, unspecified location  Bronchitis    1. Bronchitis  2. Sinusitis    Leta Baptist, MD 04/13/15 1035

## 2015-04-27 ENCOUNTER — Emergency Department (HOSPITAL_COMMUNITY)
Admission: EM | Admit: 2015-04-27 | Discharge: 2015-04-27 | Disposition: A | Discharge status: Home / Self Care | Payer: Medicare Other | Attending: Emergency Medicine | Admitting: Emergency Medicine

## 2015-04-27 ENCOUNTER — Encounter (HOSPITAL_COMMUNITY): Payer: Self-pay | Admitting: Emergency Medicine

## 2015-04-27 ENCOUNTER — Emergency Department (HOSPITAL_COMMUNITY): Payer: Medicare Other

## 2015-04-27 DIAGNOSIS — M79672 Pain in left foot: Secondary | ICD-10-CM | POA: Insufficient documentation

## 2015-04-27 DIAGNOSIS — Z792 Long term (current) use of antibiotics: Secondary | ICD-10-CM | POA: Diagnosis not present

## 2015-04-27 DIAGNOSIS — H269 Unspecified cataract: Secondary | ICD-10-CM | POA: Insufficient documentation

## 2015-04-27 DIAGNOSIS — Z992 Dependence on renal dialysis: Secondary | ICD-10-CM | POA: Insufficient documentation

## 2015-04-27 DIAGNOSIS — Z8709 Personal history of other diseases of the respiratory system: Secondary | ICD-10-CM | POA: Insufficient documentation

## 2015-04-27 DIAGNOSIS — F17210 Nicotine dependence, cigarettes, uncomplicated: Secondary | ICD-10-CM | POA: Diagnosis not present

## 2015-04-27 DIAGNOSIS — Z79899 Other long term (current) drug therapy: Secondary | ICD-10-CM | POA: Insufficient documentation

## 2015-04-27 DIAGNOSIS — I120 Hypertensive chronic kidney disease with stage 5 chronic kidney disease or end stage renal disease: Secondary | ICD-10-CM | POA: Diagnosis not present

## 2015-04-27 DIAGNOSIS — I252 Old myocardial infarction: Secondary | ICD-10-CM | POA: Diagnosis not present

## 2015-04-27 DIAGNOSIS — E119 Type 2 diabetes mellitus without complications: Secondary | ICD-10-CM | POA: Insufficient documentation

## 2015-04-27 DIAGNOSIS — N186 End stage renal disease: Secondary | ICD-10-CM | POA: Insufficient documentation

## 2015-04-27 DIAGNOSIS — K219 Gastro-esophageal reflux disease without esophagitis: Secondary | ICD-10-CM | POA: Insufficient documentation

## 2015-04-27 DIAGNOSIS — I509 Heart failure, unspecified: Secondary | ICD-10-CM | POA: Diagnosis not present

## 2015-04-27 DIAGNOSIS — Z8619 Personal history of other infectious and parasitic diseases: Secondary | ICD-10-CM | POA: Diagnosis not present

## 2015-04-27 MED ORDER — HYDROCODONE-ACETAMINOPHEN 5-325 MG PO TABS
2.0000 | ORAL_TABLET | Freq: Once | ORAL | Status: AC
  Administered 2015-04-27: 2 via ORAL
  Filled 2015-04-27: qty 2

## 2015-04-27 MED ORDER — OXYCODONE HCL 5 MG PO TABS: 5.0000 mg | ORAL_TABLET | ORAL | Status: DC | PRN

## 2015-04-27 NOTE — ED Notes (Signed)
PA at bedside for discharge

## 2015-04-27 NOTE — Discharge Instructions (Signed)

## 2015-04-27 NOTE — ED Provider Notes (Signed)
CSN: [id number]     Arrival date & time 04/27/15  1841 History  By signing my name below, I, [name], attest that this documentation has been prepared under the direction and in the presence of TRW AutomotiveKelly Vara Mairena, PA-C. Electronically Signed: Angelene GiovanniEmmanuella Branch, ED Scribe. 04/27/2015. 9:34 PM.    Chief Complaint  Patient presents with  . Foot Pain   The history is provided by the patient. No language interpreter was used.   HPI Comments: Jerome BucksMichael A Schlichting is a 59 y.o. male with a hx of DM, HTN, and neuropathy who presents to the Emergency Department complaining of gradually worsening sharp pain to the left lateral aspect of left foot onset last night. He denies any fever. He states that he was laying down when the pain began. No alleviating factors noted. He denies any recent injuries or falls. He states that he had pain in his left lower leg last week but that has resolved. He reports NKDA. He states that he was dialyzed yesterday and will go back tomorrow.   Past Medical History  Diagnosis Date  . Myocardial infarction (HCC)   . Hypertension   . GERD (gastroesophageal reflux disease)   . Pulmonary edema     Just prior to starting HD and resolved with HD. ECHO in 2008 showed normal LVEF, mild diiastolic dysfunction.   . Neuropathy (HCC)     Hx: of in toes  . Hepatitis     Hc: of Hep C  . Cataracts, both eyes     Hx: of "slightly"  . Pancreatitis     Hx: of  . CHF (congestive heart failure) (HCC)   . Diabetes mellitus     not on medication  . 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  Started HD in 2007, [facility].     Past Surgical History  Procedure Laterality Date  . Dialysis fistula creation Left   . Cholecystectomy    . Colonoscopy      Hx: of  . Ligation of arteriovenous  fistula Left 11/06/2012    Procedure: PLICATION OF BRACHIO-CEPHALIC ARTERIOVENOUS  FISTULA;  Surgeon: Fransisco HertzBrian L Chen, MD;  [facility];   Service: Vascular;  Laterality: Left;  . Pars plana vitrectomy Right 11/10/2014    Procedure: PARS PLANA VITRECTOMY WITH 25 GAUGE;  Surgeon: Sherrie GeorgeJohn D Matthews, MD;  [facility];  Service: Ophthalmology;  Laterality: Right;  . Membrane peel Right 11/10/2014    Procedure: MEMBRANE PEEL;  Surgeon: Sherrie GeorgeJohn D Matthews, MD;  [facility];  Service: Ophthalmology;  Laterality: Right;  . Gas/fluid exchange Right 11/10/2014    Procedure: GAS/FLUID EXCHANGE;  Surgeon: Sherrie GeorgeJohn D Matthews, MD;  [facility];  Service: Ophthalmology;  Laterality: Right;  . Laser photo ablation Right 11/10/2014    Procedure: LASER PHOTO ABLATION;  Surgeon: Sherrie GeorgeJohn D Matthews, MD;  [facility];  Service: Ophthalmology;  Laterality: Right;  Endo Laser   Family History  Problem Relation Age of Onset  . Diabetes Mother    Social History  Substance Use Topics  . Smoking status: Current Every Day Smoker -- 0.35 packs/day for 43 years    Types: Cigarettes    Last Attempt to Quit: 12/16/2012  . Smokeless tobacco: Never Used  . Alcohol Use: No    Review of Systems  Constitutional: Negative for fever.  Musculoskeletal: Positive for arthralgias. Negative for joint swelling.  All other systems  reviewed and are negative.   Allergies  Eggs or egg-derived products  Home Medications   Prior to Admission medications   Medication Sig Start Date End Date Taking? Authorizing Provider  amLODipine (NORVASC) 5 MG tablet Take 10 mg by mouth at bedtime.  11/24/14  Yes Historical Provider, MD  b complex-vitamin c-folic acid (NEPHRO-VITE) 0.8 MG TABS Take 0.8 mg by mouth at bedtime.   Yes Historical Provider, MD  carvedilol (COREG) 3.125 MG tablet Take 3.125 mg by mouth 2 (two) times daily. 01/08/15  Yes Historical Provider, MD  doxycycline (VIBRAMYCIN) 100 MG capsule Take 1 capsule (100 mg total) by mouth 2 (two) times daily. 04/12/15  Yes Leta Baptist, MD  famotidine (PEPCID) 20 MG tablet Take 1 tablet (20 mg total) by mouth 2  (two) times daily. 03/19/15  Yes Shawn C Joy, PA-C  FOSRENOL 1000 MG chewable tablet Chew 1,000 mg by mouth 5 (five) times daily. 01/06/15  Yes Historical Provider, MD  hydrALAZINE (APRESOLINE) 100 MG tablet Take 100 mg by mouth 2 (two) times daily.  11/05/14  Yes Historical Provider, MD  lisinopril (PRINIVIL,ZESTRIL) 40 MG tablet Take 40 mg by mouth daily.   Yes Historical Provider, MD  PROAIR HFA 108 (90 BASE) MCG/ACT inhaler Inhale 1 puff into the lungs every 4 (four) hours as needed for wheezing. Wheezing 09/22/14  Yes Historical Provider, MD  ranitidine (ZANTAC) 150 MG capsule Take 1 capsule (150 mg total) by mouth daily. 03/19/15  Yes Shawn C Joy, PA-C  spironolactone (ALDACTONE) 25 MG tablet Take 25 mg by mouth at bedtime. 03/03/15  Yes Historical Provider, MD  albuterol (PROVENTIL HFA;VENTOLIN HFA) 108 (90 BASE) MCG/ACT inhaler Inhale 1-2 puffs into the lungs every 4 (four) hours as needed for wheezing or shortness of breath (cough). Patient not taking: Reported on 04/27/2015 04/12/15   Leta Baptist, MD  benzonatate (TESSALON) 100 MG capsule Take 1 capsule (100 mg total) by mouth every 8 (eight) hours. Patient not taking: Reported on 04/27/2015 04/02/15   [facility] Ward, PA-C  bismuth subsalicylate (PEPTO-BISMOL) 262 MG/15ML suspension Take 30 mLs by mouth every 6 (six) hours as needed. Patient not taking: Reported on 04/27/2015 03/19/15   Shawn C Joy, PA-C  dexamethasone (DECADRON) 2 MG tablet Take 5 tablets (10 mg total) by mouth 2 (two) times daily with a meal. Patient not taking: Reported on 04/27/2015 04/12/15   Leta Baptist, MD  predniSONE (DELTASONE) 20 MG tablet Take 2 tablets (40 mg total) by mouth daily. Patient not taking: Reported on 04/27/2015 04/02/15   [facility] Ward, PA-C   BP 170/80 mmHg  Pulse 83  Temp(Src) 98.2 F (36.8 C) (Oral)  Resp 16  SpO2 100%   Physical Exam  Constitutional: He is oriented to person, place, and time. He appears well-developed and  well-nourished. No distress.  Nontoxic/nonseptic appearing  HENT:  Head: Normocephalic and atraumatic.  Eyes: Conjunctivae and EOM are normal. No scleral icterus.  Neck: Normal range of motion.  Cardiovascular: Normal rate, regular rhythm and intact distal pulses.   DP and PT pulses 2+ in the LLE. Capillary refill brisk in all digits of L foot.  Pulmonary/Chest: Effort normal. No respiratory distress.  Respirations even and unlabored  Musculoskeletal: Normal range of motion.       Left ankle: Normal.       Left foot: There is tenderness and bony tenderness. There is normal range of motion, no swelling, no crepitus and no deformity.  Feet:  Neurological: He is alert and oriented to person, place, and time. He exhibits normal muscle tone. Coordination normal.  Sensation to light touch intact. Patient able to wiggle all toes.  Skin: Skin is warm and dry. No rash noted. He is not diaphoretic. No erythema. No pallor.  Psychiatric: He has a normal mood and affect. His behavior is normal.  Nursing note and vitals reviewed.   ED Course  Procedures (including critical care time) DIAGNOSTIC STUDIES: Oxygen Saturation is 100% on RA, normal by my interpretation.    COORDINATION OF CARE: 9:33 PM- Pt advised of plan for treatment and pt agrees. Pt will receive an x-ray and vicodin.    Imaging Review Dg Foot Complete Left  04/27/2015  CLINICAL DATA:  Lateral left foot pain since 04/25/2015. No known injury. Cannot bear weight on the foot. EXAM: LEFT FOOT - COMPLETE 3+ VIEW COMPARISON:  Left ankle 04/12/2015 FINDINGS: Left hallux valgus deformity with degenerative changes at the first metatarsal-phalangeal joint. No evidence of acute fracture or subluxation. No focal bone lesion or bone destruction. Bone cortex and trabecular architecture appear intact. No radiopaque soft tissue foreign bodies. Plantar calcaneal spur. Vascular calcifications. IMPRESSION: Left hallux valgus deformity with  associated degenerative changes. No acute bony abnormalities. Electronically Signed   By: Burman Nieves M.D.   On: 04/27/2015 22:15     Antony Madura, PA-C has personally reviewed and evaluated these images as part of her medical decision-making.  MDM   Final diagnoses:  Left foot pain    Patient complaint of foot pain. He is neurovascularly intact. No signs of cellulitis or secondary infection. Doubt septic joint or hemarthrosis. X-ray shows no evidence of osteomyelitis. Degenerative changes noted. Question osteoarthritis as cause of underlying symptoms. No concern for abscess today. Will manage symptoms supportively. He has been referred to his primary care doctor for recheck. Return precautions given at discharge. Patient discharged in good condition with no unaddressed concerns.  I personally performed the services described in this documentation, which was scribed in my presence. The recorded information has been reviewed and is accurate.    Filed Vitals:   04/27/15 1848  BP: 170/80  Pulse: 83  Temp: 98.2 F (36.8 C)  TempSrc: Oral  Resp: 16  SpO2: 100%     Antony Madura, PA-C 04/27/15 2240  Nelva Nay, MD 04/28/15 1302

## 2015-04-27 NOTE — ED Notes (Signed)
Per EMS- c/o left foot pain on the lateral side. Denies fall/recent injury/trauma. Says he was sitting around and the pain started suddenly.  BP 176/94 HR 80 RR 16 SpO2 100%.  Hx DM, HTN.

## 2015-05-11 ENCOUNTER — Encounter (HOSPITAL_COMMUNITY): Payer: Self-pay | Admitting: Emergency Medicine

## 2015-05-11 ENCOUNTER — Emergency Department (HOSPITAL_COMMUNITY)
Admission: EM | Admit: 2015-05-11 | Discharge: 2015-05-11 | Disposition: A | Discharge status: Home / Self Care | Payer: Medicare Other | Attending: Emergency Medicine | Admitting: Emergency Medicine

## 2015-05-11 DIAGNOSIS — I120 Hypertensive chronic kidney disease with stage 5 chronic kidney disease or end stage renal disease: Secondary | ICD-10-CM | POA: Diagnosis not present

## 2015-05-11 DIAGNOSIS — I252 Old myocardial infarction: Secondary | ICD-10-CM | POA: Diagnosis not present

## 2015-05-11 DIAGNOSIS — F17210 Nicotine dependence, cigarettes, uncomplicated: Secondary | ICD-10-CM | POA: Insufficient documentation

## 2015-05-11 DIAGNOSIS — Z9889 Other specified postprocedural states: Secondary | ICD-10-CM | POA: Diagnosis not present

## 2015-05-11 DIAGNOSIS — I509 Heart failure, unspecified: Secondary | ICD-10-CM | POA: Insufficient documentation

## 2015-05-11 DIAGNOSIS — Z992 Dependence on renal dialysis: Secondary | ICD-10-CM | POA: Diagnosis not present

## 2015-05-11 DIAGNOSIS — Z8709 Personal history of other diseases of the respiratory system: Secondary | ICD-10-CM | POA: Diagnosis not present

## 2015-05-11 DIAGNOSIS — N186 End stage renal disease: Secondary | ICD-10-CM | POA: Insufficient documentation

## 2015-05-11 DIAGNOSIS — K219 Gastro-esophageal reflux disease without esophagitis: Secondary | ICD-10-CM | POA: Diagnosis not present

## 2015-05-11 DIAGNOSIS — M25552 Pain in left hip: Secondary | ICD-10-CM | POA: Diagnosis present

## 2015-05-11 DIAGNOSIS — Z8669 Personal history of other diseases of the nervous system and sense organs: Secondary | ICD-10-CM | POA: Insufficient documentation

## 2015-05-11 DIAGNOSIS — E1140 Type 2 diabetes mellitus with diabetic neuropathy, unspecified: Secondary | ICD-10-CM | POA: Insufficient documentation

## 2015-05-11 DIAGNOSIS — Z79899 Other long term (current) drug therapy: Secondary | ICD-10-CM | POA: Insufficient documentation

## 2015-05-11 DIAGNOSIS — Z8619 Personal history of other infectious and parasitic diseases: Secondary | ICD-10-CM | POA: Diagnosis not present

## 2015-05-11 MED ORDER — IBUPROFEN 200 MG PO TABS
600.0000 mg | ORAL_TABLET | Freq: Once | ORAL | Status: AC
  Administered 2015-05-11: 600 mg via ORAL
  Filled 2015-05-11: qty 1

## 2015-05-11 NOTE — ED Provider Notes (Signed)
CSN: [id number]     Arrival date & time 05/11/15  6578 History   First MD Initiated Contact with Patient 05/11/15 0405     Chief Complaint  Patient presents with  . Hip Pain      HPI Pt presents complaining of left hip pain over the past month. 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 Seen in September by myself for same and never spoke with pcp or ortho about pain. Reports ongoing moderate to severe pain at this time. No fever. No unilateral leg swelling. Reports limping secondary to pain. No new injury. ESRD   Past Medical History  Diagnosis Date  . Myocardial infarction (HCC)   . Hypertension   . GERD (gastroesophageal reflux disease)   . Pulmonary edema     Just prior to starting HD and resolved with HD. ECHO in 2008 showed normal LVEF, mild diiastolic dysfunction.   . Neuropathy (HCC)     Hx: of in toes  . Hepatitis     Hc: of Hep C  . Cataracts, both eyes     Hx: of "slightly"  . Pancreatitis     Hx: of  . CHF (congestive heart failure) (HCC)   . Diabetes mellitus     not on medication  . 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  Started HD in 2007, gets HD at [facility] on MWF schedule.     Past Surgical History  Procedure Laterality Date  . Dialysis fistula creation Left   . Cholecystectomy    . Colonoscopy      Hx: of  . Ligation of arteriovenous  fistula Left 11/06/2012    Procedure: PLICATION OF BRACHIO-CEPHALIC ARTERIOVENOUS  FISTULA;  Surgeon: Fransisco Hertz, MD;  Location: [facility] OR;  Service: Vascular;  Laterality: Left;  . Pars plana vitrectomy Right 11/10/2014    Procedure: PARS PLANA VITRECTOMY WITH 25 GAUGE;  Surgeon: Sherrie George, MD;  Location: [facility] OR;  Service: Ophthalmology;  Laterality: Right;  . Membrane peel Right 11/10/2014    Procedure: MEMBRANE PEEL;  Surgeon: Sherrie George, MD;  Location: [facility] OR;  Service: Ophthalmology;  Laterality: Right;  . Gas/fluid exchange Right 11/10/2014    Procedure: GAS/FLUID EXCHANGE;  Surgeon:  Sherrie George, MD;  Location: [facility] OR;  Service: Ophthalmology;  Laterality: Right;  . Laser photo ablation Right 11/10/2014    Procedure: LASER PHOTO ABLATION;  Surgeon: Sherrie George, MD;  Location: [facility] OR;  Service: Ophthalmology;  Laterality: Right;  Endo Laser   Family History  Problem Relation Age of Onset  . Diabetes Mother    Social History  Substance Use Topics  . Smoking status: Current Every Day Smoker -- 0.35 packs/day for 43 years    Types: Cigarettes    Last Attempt to Quit: 12/16/2012  . Smokeless tobacco: Never Used  . Alcohol Use: No    Review of Systems  All other systems reviewed and are negative.     Allergies  Eggs or egg-derived products  Home Medications   Prior to Admission medications   Medication Sig Start Date End Date Taking? Authorizing Provider  albuterol (PROVENTIL HFA;VENTOLIN HFA) 108 (90 BASE) MCG/ACT inhaler Inhale 1-2 puffs into the lungs every 4 (four) hours as needed for wheezing or shortness of breath (cough). Patient not taking: Reported on 04/27/2015 04/12/15   Leta Baptist, MD  amLODipine (NORVASC) 5 MG tablet Take 10 mg by mouth at bedtime.  11/24/14  Historical Provider, MD  b complex-vitamin c-folic acid (NEPHRO-VITE) 0.8 MG TABS Take 0.8 mg by mouth at bedtime.    Historical Provider, MD  carvedilol (COREG) 3.125 MG tablet Take 3.125 mg by mouth 2 (two) times daily. 01/08/15   Historical Provider, MD  famotidine (PEPCID) 20 MG tablet Take 1 tablet (20 mg total) by mouth 2 (two) times daily. 03/19/15   Shawn C Joy, PA-C  FOSRENOL 1000 MG chewable tablet Chew 1,000 mg by mouth 5 (five) times daily. 01/06/15   Historical Provider, MD  hydrALAZINE (APRESOLINE) 100 MG tablet Take 100 mg by mouth 2 (two) times daily.  11/05/14   Historical Provider, MD  lisinopril (PRINIVIL,ZESTRIL) 40 MG tablet Take 40 mg by mouth daily.    Historical Provider, MD  PROAIR HFA 108 (90 BASE) MCG/ACT inhaler Inhale 1 puff into the lungs every 4 (four) hours  as needed for wheezing. Wheezing 09/22/14   Historical Provider, MD  ranitidine (ZANTAC) 150 MG capsule Take 1 capsule (150 mg total) by mouth daily. 03/19/15   Shawn C Joy, PA-C  spironolactone (ALDACTONE) 25 MG tablet Take 25 mg by mouth at bedtime. 03/03/15   Historical Provider, MD   BP 171/149 mmHg  Pulse 78  Temp(Src) 97.1 F (36.2 C) (Temporal)  Resp 20  Ht 5\' 10"  (1.778 m)  Wt 162 lb 9 oz (73.738 kg)  BMI 23.33 kg/m2  SpO2 96% Physical Exam  Constitutional: He is oriented to person, place, and time. He appears well-developed and well-nourished.  HENT:  Head: Normocephalic.  Eyes: EOM are normal.  Neck: Normal range of motion.  Pulmonary/Chest: Effort normal.  Abdominal: He exhibits no distension.  Musculoskeletal: Normal range of motion.  Full ROM of left hip and left knee. Normal pulses in left foot  Neurological: He is alert and oriented to person, place, and time.  Psychiatric: He has a normal mood and affect.  Nursing note and vitals reviewed.   ED Course  Procedures (including critical care time) Labs Review Labs Reviewed - No data to display  Imaging Review No results found. I have personally reviewed and evaluated these images and lab results as part of my medical decision-making.   EKG Interpretation None      MDM   Final diagnoses:  Left hip pain    Recurrent left hip pain. Doubt septic arthritis. Vitals normal. Asked him to again follow up with his pcp    Azalia BilisKevin Ninetta Adelstein, MD 05/11/15 [id number]

## 2015-05-11 NOTE — Discharge Instructions (Signed)
Chronic Pain Discharge Instructions  °Emergency care providers appreciate that many patients coming to us are in severe pain and we wish to address their pain in the safest, most responsible manner.  It is important to recognize however, that the proper treatment of chronic pain differs from that of the pain of injuries and acute illnesses.  Our goal is to provide quality, safe, personalized care and we thank you for giving us the opportunity to serve you. °The use of narcotics and related agents for chronic pain syndromes may lead to additional physical and psychological problems.  Nearly as many people die from prescription narcotics each year as die from car crashes.  Additionally, this risk is increased if such prescriptions are obtained from a variety of sources.  Therefore, only your primary care physician or a pain management specialist is able to safely treat such syndromes with narcotic medications long-term.   ° °Documentation revealing such prescriptions have been sought from multiple sources may prohibit us from providing a refill or different narcotic medication.  Your name may be checked first through the [place] Controlled Substances Reporting System.  This database is a record of controlled substance medication prescriptions that the patient has received.  This has been established by [employer] in an effort to eliminate the dangerous, and often life threatening, practice of obtaining multiple prescriptions from different medical providers.  ° °If you have a chronic pain syndrome (i.e. chronic headaches, recurrent back or neck pain, dental pain, abdominal or pelvis pain without a specific diagnosis, or neuropathic pain such as fibromyalgia) or recurrent visits for the same condition without an acute diagnosis, you may be treated with non-narcotics and other non-addictive medicines.  Allergic reactions or negative side effects that may be reported by a patient to such medications will not  typically lead to the use of a narcotic analgesic or other controlled substance as an alternative. °  °Patients managing chronic pain with a personal physician should have provisions in place for breakthrough pain.  If you are in crisis, you should call your physician.  If your physician directs you to the emergency department, please have the doctor call and speak to our attending physician concerning your care. °  °When patients come to the Emergency Department (ED) with acute medical conditions in which the Emergency Department physician feels appropriate to prescribe narcotic or sedating pain medication, the physician will prescribe these in very limited quantities.  The amount of these medications will last only until you can see your primary care physician in his/her office.  Any patient who returns to the ED seeking refills should expect only non-narcotic pain medications.  ° °In the event of an acute medical condition exists and the emergency physician feels it is necessary that the patient be given a narcotic or sedating medication -  a responsible adult driver should be present in the room prior to the medication being given by the nurse. °  °Prescriptions for narcotic or sedating medications that have been lost, stolen or expired will not be refilled in the Emergency Department.   ° °Patients who have chronic pain may receive non-narcotic prescriptions until seen by their primary care physician.  It is every patient’s personal responsibility to maintain active prescriptions with his or her primary care physician or specialist. °

## 2015-05-11 NOTE — ED Notes (Signed)
Patient here with complaint of continued left hip pain. States onset about a month ago. States was seen here and told he has arthritis, and pain has continued. Patient ambulatory, but with some apparent difficulty. Denies other symptoms or acute injury.

## 2015-05-11 NOTE — ED Notes (Signed)
ESRD present; Mon Wed Fri schedule. Patient denies missed appointments.

## 2015-08-23 ENCOUNTER — Encounter (HOSPITAL_COMMUNITY): Payer: Self-pay | Admitting: Emergency Medicine

## 2015-08-23 ENCOUNTER — Emergency Department (HOSPITAL_COMMUNITY)
Admission: EM | Admit: 2015-08-23 | Discharge: 2015-08-23 | Disposition: A | Discharge status: Home / Self Care | Payer: Medicare Other | Attending: Emergency Medicine | Admitting: Emergency Medicine

## 2015-08-23 DIAGNOSIS — I252 Old myocardial infarction: Secondary | ICD-10-CM | POA: Insufficient documentation

## 2015-08-23 DIAGNOSIS — I120 Hypertensive chronic kidney disease with stage 5 chronic kidney disease or end stage renal disease: Secondary | ICD-10-CM | POA: Insufficient documentation

## 2015-08-23 DIAGNOSIS — Y658 Other specified misadventures during surgical and medical care: Secondary | ICD-10-CM | POA: Diagnosis not present

## 2015-08-23 DIAGNOSIS — I509 Heart failure, unspecified: Secondary | ICD-10-CM | POA: Diagnosis not present

## 2015-08-23 DIAGNOSIS — T8249XA Other complication of vascular dialysis catheter, initial encounter: Secondary | ICD-10-CM | POA: Insufficient documentation

## 2015-08-23 DIAGNOSIS — F17210 Nicotine dependence, cigarettes, uncomplicated: Secondary | ICD-10-CM | POA: Diagnosis not present

## 2015-08-23 DIAGNOSIS — N186 End stage renal disease: Secondary | ICD-10-CM | POA: Insufficient documentation

## 2015-08-23 DIAGNOSIS — E119 Type 2 diabetes mellitus without complications: Secondary | ICD-10-CM | POA: Diagnosis not present
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
## 2015-08-23 NOTE — ED Notes (Signed)
Called x2 for room. No answer. 

## 2015-08-23 NOTE — ED Notes (Signed)
Pt called x 3 no answer 

## 2015-08-24 ENCOUNTER — Emergency Department (HOSPITAL_COMMUNITY): Payer: Medicare Other

## 2015-08-24 ENCOUNTER — Emergency Department (HOSPITAL_COMMUNITY)
Admission: EM | Admit: 2015-08-24 | Discharge: 2015-08-24 | Disposition: A | Discharge status: Home / Self Care | Payer: Medicare Other | Attending: Emergency Medicine | Admitting: Emergency Medicine

## 2015-08-24 ENCOUNTER — Encounter (HOSPITAL_COMMUNITY): Payer: Self-pay | Admitting: *Deleted

## 2015-08-24 DIAGNOSIS — Z992 Dependence on renal dialysis: Secondary | ICD-10-CM | POA: Diagnosis not present

## 2015-08-24 DIAGNOSIS — Z79899 Other long term (current) drug therapy: Secondary | ICD-10-CM | POA: Insufficient documentation

## 2015-08-24 DIAGNOSIS — R011 Cardiac murmur, unspecified: Secondary | ICD-10-CM | POA: Insufficient documentation

## 2015-08-24 DIAGNOSIS — K219 Gastro-esophageal reflux disease without esophagitis: Secondary | ICD-10-CM | POA: Diagnosis not present

## 2015-08-24 DIAGNOSIS — I509 Heart failure, unspecified: Secondary | ICD-10-CM | POA: Insufficient documentation

## 2015-08-24 DIAGNOSIS — Z8669 Personal history of other diseases of the nervous system and sense organs: Secondary | ICD-10-CM | POA: Insufficient documentation

## 2015-08-24 DIAGNOSIS — Y658 Other specified misadventures during surgical and medical care: Secondary | ICD-10-CM | POA: Insufficient documentation

## 2015-08-24 DIAGNOSIS — N186 End stage renal disease: Secondary | ICD-10-CM | POA: Insufficient documentation

## 2015-08-24 DIAGNOSIS — I252 Old myocardial infarction: Secondary | ICD-10-CM | POA: Insufficient documentation

## 2015-08-24 DIAGNOSIS — T82898A Other specified complication of vascular prosthetic devices, implants and grafts, initial encounter: Secondary | ICD-10-CM | POA: Diagnosis present

## 2015-08-24 DIAGNOSIS — I120 Hypertensive chronic kidney disease with stage 5 chronic kidney disease or end stage renal disease: Secondary | ICD-10-CM | POA: Diagnosis not present

## 2015-08-24 DIAGNOSIS — F17210 Nicotine dependence, cigarettes, uncomplicated: Secondary | ICD-10-CM | POA: Insufficient documentation

## 2015-08-24 DIAGNOSIS — Z8709 Personal history of other diseases of the respiratory system: Secondary | ICD-10-CM | POA: Diagnosis not present

## 2015-08-24 DIAGNOSIS — E119 Type 2 diabetes mellitus without complications: Secondary | ICD-10-CM | POA: Diagnosis not present

## 2015-08-24 LAB — CBC WITH DIFFERENTIAL/PLATELET
BASOS ABS: 0.1 10*3/uL (ref 0.0–0.1)
Basophils Relative: 1 %
EOS ABS: 1 10*3/uL — AB (ref 0.0–0.7)
Eosinophils Relative: 15 %
HCT: 34.4 % — ABNORMAL LOW (ref 39.0–52.0)
HEMOGLOBIN: 11.6 g/dL — AB (ref 13.0–17.0)
LYMPHS PCT: 16 %
Lymphs Abs: 1.1 10*3/uL (ref 0.7–4.0)
MCH: 31.4 pg (ref 26.0–34.0)
MCHC: 33.7 g/dL (ref 30.0–36.0)
MCV: 93 fL (ref 78.0–100.0)
MONO ABS: 0.6 10*3/uL (ref 0.1–1.0)
Monocytes Relative: 9 %
NEUTROS PCT: 59 %
Neutro Abs: 4 10*3/uL (ref 1.7–7.7)
PLATELETS: 87 10*3/uL — AB (ref 150–400)
RBC: 3.7 MIL/uL — ABNORMAL LOW (ref 4.22–5.81)
RDW: 17.5 % — ABNORMAL HIGH (ref 11.5–15.5)
WBC: 6.8 10*3/uL (ref 4.0–10.5)

## 2015-08-24 LAB — BASIC METABOLIC PANEL
ANION GAP: 19 — AB (ref 5–15)
BUN: 89 mg/dL — ABNORMAL HIGH (ref 6–20)
CALCIUM: 8.1 mg/dL — AB (ref 8.9–10.3)
CO2: 20 mmol/L — ABNORMAL LOW (ref 22–32)
Chloride: 98 mmol/L — ABNORMAL LOW (ref 101–111)
Creatinine, Ser: 11.46 mg/dL — ABNORMAL HIGH (ref 0.61–1.24)
GFR, EST AFRICAN AMERICAN: 5 mL/min — AB (ref >60)
GFR, EST NON AFRICAN AMERICAN: 4 mL/min — AB (ref >60)
Glucose, Bld: 100 mg/dL — ABNORMAL HIGH (ref 65–99)
Potassium: 4.9 mmol/L (ref 3.5–5.1)
SODIUM: 137 mmol/L (ref 135–145)

## 2015-08-24 NOTE — ED Notes (Signed)
99% pulse ox ambulating

## 2015-08-24 NOTE — ED Provider Notes (Signed)
CSN: [id number]     Arrival date & time 08/24/15  0617 History   First MD Initiated Contact with Patient 08/24/15 0805     Chief Complaint  Patient presents with  . Vascular Access Problem    needs dialysis     (Consider location/radiation/quality/duration/timing/severity/associated sxs/prior Treatment) HPI Comments: Pt comes in with cc of Hemodialysis need. Hx of ESRD (m/w/f), last session was Friday. Pt reports that he went to his HD yday, and he was told that he is no longer welcomed at the center due to his behavior. Pt asked to come to the ER. Currently, pt has some dyspnea on exertion, and increased leg swelling. Pt has no palpitations, chest pains, dizziness.   ROS 10 Systems reviewed and are negative for acute change except as noted in the HPI.     The history is provided by the patient.    Past Medical History  Diagnosis Date  . Myocardial infarction (HCC)   . Hypertension   . GERD (gastroesophageal reflux disease)   . Pulmonary edema     Just prior to starting HD and resolved with HD. ECHO in 2008 showed normal LVEF, mild diiastolic dysfunction.   . Neuropathy (HCC)     Hx: of in toes  . Hepatitis     Hc: of Hep C  . Cataracts, both eyes     Hx: of "slightly"  . Pancreatitis     Hx: of  . CHF (congestive heart failure) (HCC)   . Diabetes mellitus     not on medication  . 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  Started HD in 2007, gets HD at [facility] on MWF schedule.     Past Surgical History  Procedure Laterality Date  . Dialysis fistula creation Left   . Cholecystectomy    . Colonoscopy      Hx: of  . Ligation of arteriovenous  fistula Left 11/06/2012    Procedure: PLICATION OF BRACHIO-CEPHALIC ARTERIOVENOUS  FISTULA;  Surgeon: Fransisco Hertz, MD;  Location: [facility] OR;  Service: Vascular;  Laterality: Left;  . Pars plana vitrectomy Right 11/10/2014    Procedure: PARS PLANA VITRECTOMY WITH 25 GAUGE;  Surgeon: Sherrie George, MD;  Location: [facility] OR;  Service: Ophthalmology;  Laterality: Right;  . Membrane peel Right 11/10/2014    Procedure: MEMBRANE PEEL;  Surgeon: Sherrie George, MD;  Location: [facility] OR;  Service: Ophthalmology;  Laterality: Right;  . Gas/fluid exchange Right 11/10/2014    Procedure: GAS/FLUID EXCHANGE;  Surgeon: Sherrie George, MD;  Location: [facility] OR;  Service: Ophthalmology;  Laterality: Right;  . Laser photo ablation Right 11/10/2014    Procedure: LASER PHOTO ABLATION;  Surgeon: Sherrie George, MD;  Location: [facility] OR;  Service: Ophthalmology;  Laterality: Right;  Endo Laser   Family History  Problem Relation Age of Onset  . Diabetes Mother    Social History  Substance Use Topics  . Smoking status: Current Every Day Smoker -- 0.35 packs/day for 43 years    Types: Cigarettes    Last Attempt to Quit: 12/16/2012  . Smokeless tobacco: Never Used  . Alcohol Use: No    Review of Systems    Allergies  Eggs or egg-derived products  Home Medications   Prior to Admission medications   Medication Sig Start Date End Date Taking? Authorizing Provider  amLODipine (NORVASC) 5 MG tablet Take 10 mg by mouth at bedtime.  11/24/14  Yes Historical Provider, MD  b complex-vitamin c-folic acid (NEPHRO-VITE) 0.8 MG TABS Take 0.8 mg by mouth at bedtime.   Yes Historical Provider, MD  carvedilol (COREG) 3.125 MG tablet Take 3.125 mg by mouth 2 (two) times daily. 01/08/15  Yes Historical Provider, MD  FOSRENOL 1000 MG chewable tablet Chew 1,000 mg by mouth 5 (five) times daily. 01/06/15  Yes Historical Provider, MD  hydrALAZINE (APRESOLINE) 100 MG tablet Take 100 mg by mouth 2 (two) times daily.  11/05/14  Yes Historical Provider, MD  lisinopril (PRINIVIL,ZESTRIL) 40 MG tablet Take 40 mg by mouth daily.   Yes Historical Provider, MD  PROAIR HFA 108 (90 BASE) MCG/ACT inhaler Inhale 1 puff into the lungs every 4 (four) hours as needed for wheezing. Wheezing 09/22/14  Yes Historical Provider, MD  spironolactone  (ALDACTONE) 25 MG tablet Take 25 mg by mouth at bedtime. 03/03/15  Yes Historical Provider, MD  albuterol (PROVENTIL HFA;VENTOLIN HFA) 108 (90 BASE) MCG/ACT inhaler Inhale 1-2 puffs into the lungs every 4 (four) hours as needed for wheezing or shortness of breath (cough). Patient not taking: Reported on 04/27/2015 04/12/15   Leta Baptist, MD  famotidine (PEPCID) 20 MG tablet Take 1 tablet (20 mg total) by mouth 2 (two) times daily. Patient not taking: Reported on 08/24/2015 03/19/15   Shawn C Joy, PA-C  ranitidine (ZANTAC) 150 MG capsule Take 1 capsule (150 mg total) by mouth daily. Patient not taking: Reported on 08/24/2015 03/19/15   Shawn C Joy, PA-C   BP 162/70 mmHg  Pulse 71  Temp(Src) 97.4 F (36.3 C) (Oral)  Resp 20  SpO2 99% Physical Exam  Constitutional: He is oriented to person, place, and time. He appears well-developed.  HENT:  Head: Normocephalic and atraumatic.  Eyes: Conjunctivae and EOM are normal. Pupils are equal, round, and reactive to light.  Neck: Normal range of motion. Neck supple. No JVD present.  Cardiovascular: Normal rate and regular rhythm.   Murmur heard. Pulmonary/Chest: Effort normal and breath sounds normal. No respiratory distress. He has no wheezes. He has no rales.  Abdominal: Soft. Bowel sounds are normal. He exhibits no distension. There is no tenderness. There is no rebound and no guarding.  Musculoskeletal: He exhibits edema.  Neurological: He is alert and oriented to person, place, and time.  Skin: Skin is warm.  Nursing note and vitals reviewed.   ED Course  Procedures (including critical care time) Labs Review Labs Reviewed  BASIC METABOLIC PANEL - Abnormal; Notable for the following:    Chloride 98 (*)    CO2 20 (*)    Glucose, Bld 100 (*)    BUN 89 (*)    Creatinine, Ser 11.46 (*)    Calcium 8.1 (*)    GFR calc non Af Amer 4 (*)    GFR calc Af Amer 5 (*)    Anion gap 19 (*)    All other components within normal limits  CBC WITH  DIFFERENTIAL/PLATELET - Abnormal; Notable for the following:    RBC 3.70 (*)    Hemoglobin 11.6 (*)    HCT 34.4 (*)    RDW 17.5 (*)    Platelets 87 (*)    Eosinophils Absolute 1.0 (*)    All other components within normal limits    Imaging Review Dg Chest 2 View  08/24/2015  CLINICAL DATA:  Shortness of breath, missed dialysis on Friday EXAM: CHEST  2 VIEW COMPARISON:  04/04/2015 FINDINGS: Borderline cardiomegaly. Central mild vascular congestion without convincing pulmonary  edema. Bony thorax is unremarkable. No pleural effusion. No focal consolidation. IMPRESSION: Central mild vascular congestion without pulmonary edema. No focal consolidation. Borderline cardiomegaly. Electronically Signed   By: Natasha MeadLiviu  Pop M.D.   On: 08/24/2015 08:33   I have personally reviewed and evaluated these images and lab results as part of my medical decision-making.   EKG Interpretation   Date/Time:  Tuesday August 24 2015 06:33:46 EDT Ventricular Rate:  64 PR Interval:  226 QRS Duration: 168 QT Interval:  502 QTC Calculation: 517 R Axis:   -64 Text Interpretation:  Sinus rhythm with 1st degree A-V block Left axis  deviation Right bundle branch block Septal infarct , age undetermined  Possible Inferior infarct , age undetermined Abnormal ECG No significant  change since last tracing Confirmed by HORTON  MD, Toni AmendOURTNEY ([id number]) on  08/24/2015 6:36:53 AM Also confirmed by Wilkie AyeHORTON  MD, COURTNEY ([id number]),  editor Dan HumphreysWALKER, CCT, SANDRA ([id number])  on 08/24/2015 9:32:33 AM      MDM   Final diagnoses:  ESRD on hemodialysis (HCC)    Pt comes in with cc of dialysis need. He was informed yday that he cannot come back to dialysis due to behavioral problems. Pt's labs are wnl, no hyperK, no hypoxia and CXR shows no pulm edema. Pt ambulated, and had no hypoxia. Nephrology -Dr. Fleet Contraseddering and i spoke about Mr. Zachery DauerBarnes. Since there is no emergent need for HD, pt will not be dialyzd by the nephro service. They have advised  that he seek other HD places, and typically pt get information on the dialysis center they can go to that are not part of the [facility].  Case management consulted - and they will review the case, but currently have no recommendations. Pt lives at homeless shelter, and that will make their task even more difficult.  Results and inability to provide emergent dialysis discussed. Pt is upset, but understanding, and appreciates getting case management involved. Strict ER return precautions discussed.     Derwood KaplanAnkit Shirel Mallis, MD 08/24/15 1029

## 2015-08-24 NOTE — ED Notes (Signed)
Pt is here with complaints of sob and needs dialysis.  Pt states last HD was last Friday.  Pt is alert and oriented.

## 2015-08-24 NOTE — Discharge Instructions (Signed)
Unfortunately, there is no indication for EMERGENT dialysis, and so we cannot further help you.  All i can say is that you need to come to the ER daily until we can help you get dialyzed.  We called Case Management - so they are working on helping you get proper care.  Return to the ER immediately if you have shortness of breath with even resting, chest palpitations, dizziness, fainting.   End-Stage Kidney Disease The kidneys are two organs that lie on either side of the spine between the middle of the back and the front of the abdomen. The kidneys:   Remove wastes and extra water from the blood.   Produce important hormones. These help keep bones strong, regulate blood pressure, and help create red blood cells.   Balance the fluids and chemicals in the blood and tissues. End-stage kidney disease occurs when the kidneys are so damaged that they cannot do their job. When the kidneys cannot do their job, life-threatening problems occur. The body cannot stay clean and strong without the help of the kidneys. In end-stage kidney disease, the kidneys cannot get better.You need a new kidney or treatments to do some of the work healthy kidneys do in order to stay alive. CAUSES  End-stage kidney disease usually occurs when a long-lasting (chronic) kidney disease gets worse. It may also occur after the kidneys are suddenly damaged (acute kidney injury).  SYMPTOMS   Swelling (edema) of the legs, ankles, or feet.   Tiredness (lethargy).   Nausea or vomiting.   Confusion.   Problems with urination, such as:   Decreased urine production.   Frequent urination, especially at night.   Frequent accidents in children who are potty trained.   Muscle twitches and cramps.   Persistent itchiness.   Loss of appetite.   Headaches.   Abnormally dark or light skin.   Numbness in the hands or feet.   Easy bruising.   Frequent hiccups.   Menstruation stops. DIAGNOSIS    Your health care provider will measure your blood pressure and take some tests. These may include:   Urine tests.   Blood tests.   Imaging tests, such as:   An ultrasound exam.   Computed tomography (CT).  A kidney biopsy. TREATMENT  There are two treatments for end-stage kidney disease:   A procedure that removes toxic wastes from the body (dialysis).   Receiving a new kidney (kidney transplant). Both of these treatments have serious risks and consequences. Your health care provider will help you determine which treatment is best for you based on your health, age, and other factors. In addition to having dialysis or a kidney transplant, you may need to take medicines to control high blood pressure (hypertension) and cholesterol and to decrease phosphorus levels in your blood.  HOME CARE INSTRUCTIONS  Follow your prescribed diet.   Take medicines only as directed by your health care provider.   Do not take any new medicines (prescription, over-the-counter, or nutritional supplements) unless approved by your health care provider. Many medicines can worsen your kidney damage or need to have the dose adjusted.   Keep all follow-up visits as directed by your health care provider. MAKE SURE YOU:  Understand these instructions.  Will watch your condition.  Will get help right away if you are not doing well or get worse.   This information is not intended to replace advice given to you by your health care provider. Make sure you discuss any questions you have  with your health care provider.   Document Released: 07/22/2003 Document Revised: 05/22/2014 Document Reviewed: 12/29/2011 Elsevier Interactive Patient Education [employer]2016 Elsevier Inc.

## 2015-08-25 ENCOUNTER — Emergency Department (HOSPITAL_COMMUNITY): Payer: Medicare Other

## 2015-08-25 ENCOUNTER — Non-Acute Institutional Stay (HOSPITAL_COMMUNITY)
Admission: EM | Admit: 2015-08-25 | Discharge: 2015-08-25 | Disposition: A | Discharge status: Home / Self Care | Payer: Medicare Other | Attending: Emergency Medicine | Admitting: Emergency Medicine

## 2015-08-25 ENCOUNTER — Encounter (HOSPITAL_COMMUNITY): Payer: Self-pay | Admitting: Emergency Medicine

## 2015-08-25 DIAGNOSIS — I130 Hypertensive heart and chronic kidney disease with heart failure and stage 1 through stage 4 chronic kidney disease, or unspecified chronic kidney disease: Secondary | ICD-10-CM | POA: Insufficient documentation

## 2015-08-25 DIAGNOSIS — I252 Old myocardial infarction: Secondary | ICD-10-CM | POA: Diagnosis not present

## 2015-08-25 DIAGNOSIS — E1122 Type 2 diabetes mellitus with diabetic chronic kidney disease: Secondary | ICD-10-CM | POA: Insufficient documentation

## 2015-08-25 DIAGNOSIS — N186 End stage renal disease: Secondary | ICD-10-CM | POA: Insufficient documentation

## 2015-08-25 DIAGNOSIS — E8770 Fluid overload, unspecified: Secondary | ICD-10-CM

## 2015-08-25 DIAGNOSIS — I509 Heart failure, unspecified: Secondary | ICD-10-CM | POA: Insufficient documentation

## 2015-08-25 DIAGNOSIS — E1140 Type 2 diabetes mellitus with diabetic neuropathy, unspecified: Secondary | ICD-10-CM | POA: Insufficient documentation

## 2015-08-25 DIAGNOSIS — F17210 Nicotine dependence, cigarettes, uncomplicated: Secondary | ICD-10-CM | POA: Insufficient documentation

## 2015-08-25 DIAGNOSIS — E875 Hyperkalemia: Secondary | ICD-10-CM | POA: Diagnosis present

## 2015-08-25 DIAGNOSIS — I5030 Unspecified diastolic (congestive) heart failure: Secondary | ICD-10-CM | POA: Diagnosis present

## 2015-08-25 LAB — RENAL FUNCTION PANEL
ALBUMIN: 2.7 g/dL — AB (ref 3.5–5.0)
ANION GAP: 20 — AB (ref 5–15)
BUN: 105 mg/dL — ABNORMAL HIGH (ref 6–20)
CALCIUM: 7.3 mg/dL — AB (ref 8.9–10.3)
CHLORIDE: 99 mmol/L — AB (ref 101–111)
CO2: 17 mmol/L — AB (ref 22–32)
Creatinine, Ser: 13.16 mg/dL — ABNORMAL HIGH (ref 0.61–1.24)
GFR calc non Af Amer: 4 mL/min — ABNORMAL LOW (ref >60)
GFR, EST AFRICAN AMERICAN: 4 mL/min — AB (ref >60)
GLUCOSE: 180 mg/dL — AB (ref 65–99)
Phosphorus: 5.6 mg/dL — ABNORMAL HIGH (ref 2.5–4.6)
Potassium: 4.6 mmol/L (ref 3.5–5.1)
SODIUM: 136 mmol/L (ref 135–145)

## 2015-08-25 LAB — CBG MONITORING, ED: GLUCOSE-CAPILLARY: 64 mg/dL — AB (ref 65–99)

## 2015-08-25 LAB — CBC
HCT: 31.8 % — ABNORMAL LOW (ref 39.0–52.0)
HEMOGLOBIN: 10.6 g/dL — AB (ref 13.0–17.0)
MCH: 31.2 pg (ref 26.0–34.0)
MCHC: 33.3 g/dL (ref 30.0–36.0)
MCV: 93.5 fL (ref 78.0–100.0)
Platelets: 74 10*3/uL — ABNORMAL LOW (ref 150–400)
RBC: 3.4 MIL/uL — AB (ref 4.22–5.81)
RDW: 17.6 % — ABNORMAL HIGH (ref 11.5–15.5)
WBC: 6.5 10*3/uL (ref 4.0–10.5)

## 2015-08-25 LAB — I-STAT CHEM 8, ED
BUN: 102 mg/dL — AB (ref 6–20)
CHLORIDE: 101 mmol/L (ref 101–111)
CREATININE: 12.9 mg/dL — AB (ref 0.61–1.24)
Calcium, Ion: 0.87 mmol/L — ABNORMAL LOW (ref 1.12–1.23)
Glucose, Bld: 69 mg/dL (ref 65–99)
HEMATOCRIT: 36 % — AB (ref 39.0–52.0)
Hemoglobin: 12.2 g/dL — ABNORMAL LOW (ref 13.0–17.0)
Potassium: 5.7 mmol/L — ABNORMAL HIGH (ref 3.5–5.1)
SODIUM: 134 mmol/L — AB (ref 135–145)
TCO2: 20 mmol/L (ref 0–100)

## 2015-08-25 MED ORDER — SODIUM POLYSTYRENE SULFONATE 15 GM/60ML PO SUSP
30.0000 g | Freq: Once | ORAL | Status: AC
  Administered 2015-08-25: 30 g via ORAL
  Filled 2015-08-25: qty 120

## 2015-08-25 MED ORDER — INSULIN ASPART 100 UNIT/ML ~~LOC~~ SOLN
5.0000 [IU] | Freq: Once | SUBCUTANEOUS | Status: AC
  Administered 2015-08-25: 5 [IU] via SUBCUTANEOUS
  Filled 2015-08-25: qty 1

## 2015-08-25 MED ORDER — DEXTROSE 50 % IV SOLN
1.0000 | Freq: Once | INTRAVENOUS | Status: AC
  Administered 2015-08-25: 50 mL via INTRAVENOUS
  Filled 2015-08-25: qty 50

## 2015-08-25 NOTE — ED Notes (Signed)
Pt. arrived with EMS from a bus stop reports edema/swelling lower extremities onset this week , pt. did not receive his hemodialysis Monday this week , denies SOB / respirations unlabored . .Marland Kitchen

## 2015-08-25 NOTE — ED Provider Notes (Signed)
CSN: [id number]     Arrival date & time 08/25/15  0137 History   By signing my name below, I, [name], attest that this documentation has been prepared under the direction and in the presence of Jerome Baton, MD.  Electronically Signed: Arlan Branch, ED Scribe. 08/25/2015. 3:44 AM.   Chief Complaint  Patient presents with  . Leg Swelling   The history is provided by the patient. No language interpreter was used.    HPI Comments: [name] brought in by EMS is a 60 y.o. male with a PMHx of MI, HTN, CHF, DM, and ESRD who presents to the Emergency Department complaining of constant, ongoing swelling to bilateral lower extremities x 1 week. Pt also reports ongoing shortness of breath and diarrhea. No prescribed medications or home remedies attempted prior to arrival. No recent fever or chills. [name] typically dialyzes on Mondays, Wednesdays, and Fridays. However, last treatment on Friday 4/7. Pt states he was recently kicked out of dialysis and has not found placement in a new facility. No known allergies to medications.  PCP: Jerome Cloud, MD    Past Medical History  Diagnosis Date  . Myocardial infarction (HCC)   . Hypertension   . GERD (gastroesophageal reflux disease)   . Pulmonary edema     Just prior to starting HD and resolved with HD. ECHO in 2008 showed normal LVEF, mild diiastolic dysfunction.   . Neuropathy (HCC)     Hx: of in toes  . Hepatitis     Hc: of Hep C  . Cataracts, both eyes     Hx: of "slightly"  . Pancreatitis     Hx: of  . CHF (congestive heart failure) (HCC)   . Diabetes mellitus     not on medication  . 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  Started HD in 2007, gets HD at [facility] on MWF schedule.     Past Surgical History  Procedure Laterality Date  . Dialysis fistula creation Left   . Cholecystectomy    . Colonoscopy      Hx: of  . Ligation of arteriovenous  fistula Left 11/06/2012     Procedure: PLICATION OF BRACHIO-CEPHALIC ARTERIOVENOUS  FISTULA;  Surgeon: Jerome Hertz, MD;  Location: [facility] OR;  Service: Vascular;  Laterality: Left;  . Pars plana vitrectomy Right 11/10/2014    Procedure: PARS PLANA VITRECTOMY WITH 25 GAUGE;  Surgeon: Jerome George, MD;  Location: [facility] OR;  Service: Ophthalmology;  Laterality: Right;  . Membrane peel Right 11/10/2014    Procedure: MEMBRANE PEEL;  Surgeon: Jerome George, MD;  Location: [facility] OR;  Service: Ophthalmology;  Laterality: Right;  . Gas/fluid exchange Right 11/10/2014    Procedure: GAS/FLUID EXCHANGE;  Surgeon: Jerome George, MD;  Location: [facility] OR;  Service: Ophthalmology;  Laterality: Right;  . Laser photo ablation Right 11/10/2014    Procedure: LASER PHOTO ABLATION;  Surgeon: Jerome George, MD;  Location: [facility] OR;  Service: Ophthalmology;  Laterality: Right;  Endo Laser   Family History  Problem Relation Age of Onset  . Diabetes Mother    Social History  Substance Use Topics  . Smoking status: Current Every Day Smoker -- 0.35 packs/day for 43 years    Types: Cigarettes    Last Attempt to Quit: 12/16/2012  . Smokeless tobacco: Never Used  . Alcohol Use: No    Review of Systems  Constitutional: Negative for  fever and chills.  Respiratory: Positive for shortness of breath.   Cardiovascular: Positive for leg swelling. Negative for chest pain.  Gastrointestinal: Positive for diarrhea. Negative for nausea, vomiting and abdominal pain.  Neurological: Negative for headaches.  Psychiatric/Behavioral: Negative for confusion.  All other systems reviewed and are negative.     Allergies  Eggs or egg-derived products  Home Medications   Prior to Admission medications   Medication Sig Start Date End Date Taking? Authorizing Provider  amLODipine (NORVASC) 5 MG tablet Take 10 mg by mouth at bedtime.  11/24/14  Yes Historical Provider, MD  b complex-vitamin c-folic acid (NEPHRO-VITE) 0.8 MG TABS Take 0.8 mg by mouth at bedtime.   Yes  Historical Provider, MD  carvedilol (COREG) 3.125 MG tablet Take 3.125 mg by mouth 2 (two) times daily. 01/08/15  Yes Historical Provider, MD  FOSRENOL 1000 MG chewable tablet Chew 1,000 mg by mouth 5 (five) times daily. 01/06/15  Yes Historical Provider, MD  hydrALAZINE (APRESOLINE) 100 MG tablet Take 100 mg by mouth 2 (two) times daily.  11/05/14  Yes Historical Provider, MD  lisinopril (PRINIVIL,ZESTRIL) 40 MG tablet Take 40 mg by mouth daily.   Yes Historical Provider, MD  PROAIR HFA 108 (90 BASE) MCG/ACT inhaler Inhale 1 puff into the lungs every 4 (four) hours as needed for wheezing. Wheezing 09/22/14  Yes Historical Provider, MD  spironolactone (ALDACTONE) 25 MG tablet Take 25 mg by mouth at bedtime. 03/03/15  Yes Historical Provider, MD  albuterol (PROVENTIL HFA;VENTOLIN HFA) 108 (90 BASE) MCG/ACT inhaler Inhale 1-2 puffs into the lungs every 4 (four) hours as needed for wheezing or shortness of breath (cough). Patient not taking: Reported on 04/27/2015 04/12/15   Jerome Baptist, MD  famotidine (PEPCID) 20 MG tablet Take 1 tablet (20 mg total) by mouth 2 (two) times daily. Patient not taking: Reported on 08/24/2015 03/19/15   Jerome C Joy, PA-C  ranitidine (ZANTAC) 150 MG capsule Take 1 capsule (150 mg total) by mouth daily. Patient not taking: Reported on 08/24/2015 03/19/15   Jerome Pancoast, PA-C   Triage Vitals: BP 146/87 mmHg  Pulse 66  Temp(Src) 98.2 F (36.8 C) (Oral)  Resp 13  SpO2 97%   Physical Exam  Constitutional: He is oriented to person, place, and time. No distress.  HENT:  Head: Normocephalic and atraumatic.  Cardiovascular: Normal rate and regular rhythm.   Pulmonary/Chest: Effort normal. No respiratory distress. He has wheezes.  Scant wheezing  Abdominal: Soft. Bowel sounds are normal. There is no tenderness. There is no rebound.  Musculoskeletal: He exhibits edema.  1+ BLE edema  Neurological: He is alert and oriented to person, place, and time.  Skin: Skin is warm and  dry.  Psychiatric: He has a normal mood and affect.  Nursing note and vitals reviewed.   ED Course  Procedures (including critical care time)  CRITICAL CARE Performed by: [name]   Total critical care time: 25 minutes  Critical care time was exclusive of separately billable procedures and treating other patients.  Critical care was necessary to treat or prevent imminent or life-threatening deterioration.  Critical care was time spent personally by me on the following activities: development of treatment plan with patient and/or surrogate as well as nursing, discussions with consultants, evaluation of patient's response to treatment, examination of patient, obtaining history from patient or surrogate, ordering and performing treatments and interventions, ordering and review of laboratory studies, ordering and review of radiographic studies, pulse oximetry and re-evaluation of  patient's condition.   DIAGNOSTIC STUDIES: Oxygen Saturation is 96% on RA, Normal by my interpretation.    COORDINATION OF CARE: 3:41 AM- Will order EKG and blood work. Discussed treatment plan with pt at bedside and pt agreed to plan.     Labs Review Labs Reviewed  I-STAT CHEM 8, ED - Abnormal; Notable for the following:    Sodium 134 (*)    Potassium 5.7 (*)    BUN 102 (*)    Creatinine, Ser 12.90 (*)    Calcium, Ion 0.87 (*)    Hemoglobin 12.2 (*)    HCT 36.0 (*)    All other components within normal limits  CBG MONITORING, ED - Abnormal; Notable for the following:    Glucose-Capillary 64 (*)    All other components within normal limits    Imaging Review Dg Chest 2 View  08/24/2015  CLINICAL DATA:  Shortness of breath, missed dialysis on Friday EXAM: CHEST  2 VIEW COMPARISON:  04/04/2015 FINDINGS: Borderline cardiomegaly. Central mild vascular congestion without convincing pulmonary edema. Bony thorax is unremarkable. No pleural effusion. No focal consolidation. IMPRESSION: Central mild  vascular congestion without pulmonary edema. No focal consolidation. Borderline cardiomegaly. Electronically Signed   By: Natasha MeadLiviu  Pop M.D.   On: 08/24/2015 08:33   Dg Chest Portable 1 View  08/25/2015  CLINICAL DATA:  Chest pain and shortness of breath tonight. History of hypertension, end-stage renal disease on dialysis, diabetes. EXAM: PORTABLE CHEST 1 VIEW COMPARISON:  Chest radiograph August 24, 2015 FINDINGS: The cardiac silhouette appears mildly enlarged, mediastinal silhouette is unremarkable. Pulmonary vascular congestion without pleural effusion or focal consolidation. No pneumothorax. Vascular stent projects in LEFT supraclavicular soft tissues, vascular clips in RIGHT arm. IMPRESSION: Mild cardiomegaly and pulmonary vascular congestion. Electronically Signed   By: Awilda Metroourtnay  Bloomer M.D.   On: 08/25/2015 04:27   I have personally reviewed and evaluated these images and lab results as part of my medical decision-making.   EKG Interpretation   Date/Time:  Wednesday August 25 2015 03:24:06 EDT Ventricular Rate:  67 PR Interval:  232 QRS Duration: 170 QT Interval:  505 QTC Calculation: 533 R Axis:   -66 Text Interpretation:  Sinus rhythm Prolonged PR interval Right bundle  branch block Inferior infarct, old No significant change since last  tracing Confirmed by Mykaylah Ballman  MD, Takao Lizer ([id number]) on 08/25/2015 5:34:27 AM      MDM   Final diagnoses:  Hypervolemia, unspecified hypervolemia type  Hyperkalemia    Patient presents for dialysis. Reports shortness of breath and volume overload. Was assessed yesterday for the same. No emergent need for dialysis at that time.  No respiratory distress. He does have some wheezing on exam. Lab work notable for mild hyperkalemia. He was given insulin, glucose, and Kayexalate. Does have evidence of pulmonary edema on chest x-ray. Nephrology consulted, Dr. Hyman HopesWebb. They will arrange for dialysis later today.   I personally performed the services described  in this documentation, which was scribed in my presence. The recorded information has been reviewed and is accurate.   Jerome Batonourtney F Commodore Bellew, MD 08/25/15 [phone]

## 2015-08-25 NOTE — Progress Notes (Signed)
Pt completed hemodialysis today due shortness of breath and hyperkalemia. Pt was able to tolerated treatment well. Had 2x episodes of diarrhea from Kayexalate. Net UF removed: 4 liters. V/S stable: T:98.74F, P 70, R 17, BP 150/92, Wt. 76 kg. Assisted to ED exit via wheelchair. Discharged to Home via own outpatient dialysis transportation.

## 2015-08-25 NOTE — Procedures (Signed)
I was present at this session.  I have reviewed the session itself and made appropriate changes.  HD via [facility] av[facility].  bp ^, lower vol.  Access prss ok.  Langford Carias L 4/12/20178:44 AM

## 2015-08-28 ENCOUNTER — Encounter (HOSPITAL_COMMUNITY): Payer: Self-pay

## 2015-08-28 ENCOUNTER — Emergency Department (HOSPITAL_COMMUNITY)
Admission: EM | Admit: 2015-08-28 | Discharge: 2015-08-28 | Disposition: A | Discharge status: Home / Self Care | Payer: Medicare Other | Attending: Emergency Medicine | Admitting: Emergency Medicine

## 2015-08-28 ENCOUNTER — Emergency Department (HOSPITAL_COMMUNITY): Payer: Medicare Other

## 2015-08-28 DIAGNOSIS — I252 Old myocardial infarction: Secondary | ICD-10-CM | POA: Diagnosis not present

## 2015-08-28 DIAGNOSIS — Z8719 Personal history of other diseases of the digestive system: Secondary | ICD-10-CM | POA: Diagnosis not present

## 2015-08-28 DIAGNOSIS — N186 End stage renal disease: Secondary | ICD-10-CM | POA: Insufficient documentation

## 2015-08-28 DIAGNOSIS — F17210 Nicotine dependence, cigarettes, uncomplicated: Secondary | ICD-10-CM | POA: Diagnosis not present

## 2015-08-28 DIAGNOSIS — R0602 Shortness of breath: Secondary | ICD-10-CM | POA: Diagnosis not present

## 2015-08-28 DIAGNOSIS — Z8619 Personal history of other infectious and parasitic diseases: Secondary | ICD-10-CM | POA: Insufficient documentation

## 2015-08-28 DIAGNOSIS — I120 Hypertensive chronic kidney disease with stage 5 chronic kidney disease or end stage renal disease: Secondary | ICD-10-CM | POA: Insufficient documentation

## 2015-08-28 DIAGNOSIS — Z79899 Other long term (current) drug therapy: Secondary | ICD-10-CM | POA: Insufficient documentation

## 2015-08-28 DIAGNOSIS — E119 Type 2 diabetes mellitus without complications: Secondary | ICD-10-CM | POA: Insufficient documentation

## 2015-08-28 DIAGNOSIS — Z992 Dependence on renal dialysis: Secondary | ICD-10-CM | POA: Diagnosis not present

## 2015-08-28 DIAGNOSIS — R011 Cardiac murmur, unspecified: Secondary | ICD-10-CM | POA: Diagnosis not present

## 2015-08-28 DIAGNOSIS — Z8669 Personal history of other diseases of the nervous system and sense organs: Secondary | ICD-10-CM | POA: Insufficient documentation

## 2015-08-28 DIAGNOSIS — I509 Heart failure, unspecified: Secondary | ICD-10-CM | POA: Diagnosis not present

## 2015-08-28 LAB — CBC WITH DIFFERENTIAL/PLATELET
BASOS ABS: 0.1 10*3/uL (ref 0.0–0.1)
BASOS PCT: 2 %
Eosinophils Absolute: 1.1 10*3/uL — ABNORMAL HIGH (ref 0.0–0.7)
Eosinophils Relative: 15 %
HEMATOCRIT: 34 % — AB (ref 39.0–52.0)
Hemoglobin: 10.8 g/dL — ABNORMAL LOW (ref 13.0–17.0)
LYMPHS PCT: 15 %
Lymphs Abs: 1.1 10*3/uL (ref 0.7–4.0)
MCH: 29.7 pg (ref 26.0–34.0)
MCHC: 31.8 g/dL (ref 30.0–36.0)
MCV: 93.4 fL (ref 78.0–100.0)
MONO ABS: 0.7 10*3/uL (ref 0.1–1.0)
Monocytes Relative: 9 %
NEUTROS ABS: 4.3 10*3/uL (ref 1.7–7.7)
NEUTROS PCT: 60 %
Platelets: 79 10*3/uL — ABNORMAL LOW (ref 150–400)
RBC: 3.64 MIL/uL — AB (ref 4.22–5.81)
RDW: 17 % — AB (ref 11.5–15.5)
WBC: 7.2 10*3/uL (ref 4.0–10.5)

## 2015-08-28 LAB — I-STAT CHEM 8, ED
BUN: 82 mg/dL — ABNORMAL HIGH (ref 6–20)
Calcium, Ion: 0.83 mmol/L — ABNORMAL LOW (ref 1.12–1.23)
Chloride: 103 mmol/L (ref 101–111)
Creatinine, Ser: 12 mg/dL — ABNORMAL HIGH (ref 0.61–1.24)
Glucose, Bld: 76 mg/dL (ref 65–99)
HEMATOCRIT: 37 % — AB (ref 39.0–52.0)
HEMOGLOBIN: 12.6 g/dL — AB (ref 13.0–17.0)
POTASSIUM: 5.3 mmol/L — AB (ref 3.5–5.1)
Sodium: 137 mmol/L (ref 135–145)
TCO2: 20 mmol/L (ref 0–100)

## 2015-08-28 MED ORDER — LACTULOSE 10 GM/15ML PO SOLN
30.0000 g | Freq: Once | ORAL | Status: AC
  Administered 2015-08-28: 30 g via ORAL
  Filled 2015-08-28: qty 45

## 2015-08-28 NOTE — ED Notes (Signed)
Pt arrived via [facility] for dialysis, last treatment was Wednesday, he has no schedule reports he has been kicked out of dialysis center.  Right arm access.  CBG 104

## 2015-08-28 NOTE — Discharge Instructions (Signed)
End-Stage Kidney Disease [name], call your primary care doctor to help you set up a dialysis center.  If any symptoms worsen, come back to the ED immediately. Thank you. The kidneys are two organs that lie on either side of the spine between the middle of the back and the front of the abdomen. The kidneys:   Remove wastes and extra water from the blood.   Produce important hormones. These help keep bones strong, regulate blood pressure, and help create red blood cells.   Balance the fluids and chemicals in the blood and tissues. End-stage kidney disease occurs when the kidneys are so damaged that they cannot do their job. When the kidneys cannot do their job, life-threatening problems occur. The body cannot stay clean and strong without the help of the kidneys. In end-stage kidney disease, the kidneys cannot get better.You need a new kidney or treatments to do some of the work healthy kidneys do in order to stay alive. CAUSES  End-stage kidney disease usually occurs when a long-lasting (chronic) kidney disease gets worse. It may also occur after the kidneys are suddenly damaged (acute kidney injury).  SYMPTOMS   Swelling (edema) of the legs, ankles, or feet.   Tiredness (lethargy).   Nausea or vomiting.   Confusion.   Problems with urination, such as:   Decreased urine production.   Frequent urination, especially at night.   Frequent accidents in children who are potty trained.   Muscle twitches and cramps.   Persistent itchiness.   Loss of appetite.   Headaches.   Abnormally dark or light skin.   Numbness in the hands or feet.   Easy bruising.   Frequent hiccups.   Menstruation stops. DIAGNOSIS  Your health care provider will measure your blood pressure and take some tests. These may include:   Urine tests.   Blood tests.   Imaging tests, such as:   An ultrasound exam.   Computed tomography (CT).  A kidney biopsy. TREATMENT    There are two treatments for end-stage kidney disease:   A procedure that removes toxic wastes from the body (dialysis).   Receiving a new kidney (kidney transplant). Both of these treatments have serious risks and consequences. Your health care provider will help you determine which treatment is best for you based on your health, age, and other factors. In addition to having dialysis or a kidney transplant, you may need to take medicines to control high blood pressure (hypertension) and cholesterol and to decrease phosphorus levels in your blood.  HOME CARE INSTRUCTIONS  Follow your prescribed diet.   Take medicines only as directed by your health care provider.   Do not take any new medicines (prescription, over-the-counter, or nutritional supplements) unless approved by your health care provider. Many medicines can worsen your kidney damage or need to have the dose adjusted.   Keep all follow-up visits as directed by your health care provider. MAKE SURE YOU:  Understand these instructions.  Will watch your condition.  Will get help right away if you are not doing well or get worse.   This information is not intended to replace advice given to you by your health care provider. Make sure you discuss any questions you have with your health care provider.   Document Released: 07/22/2003 Document Revised: 05/22/2014 Document Reviewed: 12/29/2011 Elsevier Interactive Patient Education [employer]2016 Elsevier Inc.

## 2015-08-28 NOTE — ED Provider Notes (Signed)
CSN: [id number]     Arrival date & time 08/28/15  0442 History   First MD Initiated Contact with Patient 08/28/15 0554     Chief Complaint  Patient presents with  . Vascular Access Problem     (Consider location/radiation/quality/duration/timing/severity/associated sxs/prior Treatment) HPI Jillene BucksMichael A Meenan is a 60 y.o. male with PMH of CHF, ESRD, here with worsening SOB.  Patient states he has been kicked out of dialysis centers in the city.  He was told to report to the emergency department for dialysis. He states his shortness of breath is worse on exertion. He denies any fevers or recent infections.  There are no further complaints.  10 Systems reviewed and are negative for acute change except as noted in the HPI.     Past Medical History  Diagnosis Date  . Myocardial infarction (HCC)   . Hypertension   . GERD (gastroesophageal reflux disease)   . Pulmonary edema     Just prior to starting HD and resolved with HD. ECHO in 2008 showed normal LVEF, mild diiastolic dysfunction.   . Neuropathy (HCC)     Hx: of in toes  . Hepatitis     Hc: of Hep C  . Cataracts, both eyes     Hx: of "slightly"  . Pancreatitis     Hx: of  . CHF (congestive heart failure) (HCC)   . Diabetes mellitus     not on medication  . 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  Started HD in 2007, [facility] MWF schedule.     Past Surgical History  Procedure Laterality Date  . Dialysis fistula creation Left   . Cholecystectomy    . Colonoscopy      Hx: of  . Ligation of arteriovenous  fistula Left 11/06/2012    Procedure: PLICATION OF BRACHIO-CEPHALIC ARTERIOVENOUS  FISTULA;  Surgeon: Fransisco HertzBrian L Chen, MD;  [facility];  Service: Vascular;  Laterality: Left;  . Pars plana vitrectomy Right 11/10/2014    Procedure: PARS PLANA VITRECTOMY WITH 25 GAUGE;  Surgeon: Sherrie GeorgeJohn D Matthews, MD;  [facility];  Service: Ophthalmology;  Laterality: Right;  . Membrane peel  Right 11/10/2014    Procedure: MEMBRANE PEEL;  Surgeon: Sherrie GeorgeJohn D Matthews, MD;  [facility];  Service: Ophthalmology;  Laterality: Right;  . Gas/fluid exchange Right 11/10/2014    Procedure: GAS/FLUID EXCHANGE;  Surgeon: Sherrie GeorgeJohn D Matthews, MD;  [facility];  Service: Ophthalmology;  Laterality: Right;  . Laser photo ablation Right 11/10/2014    Procedure: LASER PHOTO ABLATION;  Surgeon: Sherrie GeorgeJohn D Matthews, MD;  [facility];  Service: Ophthalmology;  Laterality: Right;  Endo Laser   Family History  Problem Relation Age of Onset  . Diabetes Mother    Social History  Substance Use Topics  . Smoking status: Current Every Day Smoker -- 0.35 packs/day for 43 years    Types: Cigarettes    Last Attempt to Quit: 12/16/2012  . Smokeless tobacco: Never Used  . Alcohol Use: No    Review of Systems    Allergies  Eggs or egg-derived products  Home Medications   Prior to Admission medications   Medication Sig Start Date End Date Taking? Authorizing Provider  amLODipine (NORVASC) 5 MG tablet Take 10 mg by mouth at bedtime.  11/24/14  Yes Historical Provider, MD  b complex-vitamin c-folic acid (NEPHRO-VITE) 0.8 MG TABS Take 0.8 mg by mouth at  bedtime.   Yes Historical Provider, MD  carvedilol (COREG) 3.125 MG tablet Take 3.125 mg by mouth 2 (two) times daily. 01/08/15  Yes Historical Provider, MD  FOSRENOL 1000 MG chewable tablet Chew 1,000 mg by mouth 5 (five) times daily. 01/06/15  Yes Historical Provider, MD  hydrALAZINE (APRESOLINE) 100 MG tablet Take 100 mg by mouth 2 (two) times daily.  11/05/14  Yes Historical Provider, MD  lisinopril (PRINIVIL,ZESTRIL) 40 MG tablet Take 40 mg by mouth daily.   Yes Historical Provider, MD  PROAIR HFA 108 (90 BASE) MCG/ACT inhaler Inhale 1 puff into the lungs every 4 (four) hours as needed for wheezing. Wheezing 09/22/14  Yes Historical Provider, MD  spironolactone (ALDACTONE) 25 MG tablet Take 25 mg by mouth at bedtime. 03/03/15  Yes Historical Provider, MD    BP 157/75 mmHg  Pulse 68  Temp(Src) 97.9 F (36.6 C) (Oral)  Resp 18  SpO2 99% Physical Exam  Constitutional: He is oriented to person, place, and time. Vital signs are normal. He appears well-developed and well-nourished.  Non-toxic appearance. He does not appear ill. No distress.  HENT:  Head: Normocephalic and atraumatic.  Nose: Nose normal.  Mouth/Throat: Oropharynx is clear and moist. No oropharyngeal exudate.  Eyes: Conjunctivae and EOM are normal. Pupils are equal, round, and reactive to light. No scleral icterus.  Neck: Normal range of motion. Neck supple. No tracheal deviation, no edema, no erythema and normal range of motion present. No thyroid mass and no thyromegaly present.  Cardiovascular: Normal rate, regular rhythm, S1 normal, S2 normal, intact distal pulses and normal pulses.  Exam reveals no gallop and no friction rub.   Murmur heard. Pulmonary/Chest: Effort normal and breath sounds normal. No respiratory distress. He has no wheezes. He has no rhonchi. He has no rales.  Abdominal: Soft. Normal appearance and bowel sounds are normal. He exhibits no distension, no ascites and no mass. There is no hepatosplenomegaly. There is no tenderness. There is no rebound, no guarding and no CVA tenderness.  Musculoskeletal: Normal range of motion. He exhibits edema. He exhibits no tenderness.  AVF with palpable thrill  Lymphadenopathy:    He has no cervical adenopathy.  Neurological: He is alert and oriented to person, place, and time. He has normal strength. No cranial nerve deficit or sensory deficit.  Skin: Skin is warm, dry and intact. No petechiae and no rash noted. He is not diaphoretic. No erythema. No pallor.  Psychiatric: He has a normal mood and affect. His behavior is normal. Judgment normal.  Nursing note and vitals reviewed.   ED Course  Procedures (including critical care time) Labs Review Labs Reviewed  CBC WITH DIFFERENTIAL/PLATELET - Abnormal; Notable for the  following:    RBC 3.64 (*)    Hemoglobin 10.8 (*)    HCT 34.0 (*)    RDW 17.0 (*)    Platelets 79 (*)    Eosinophils Absolute 1.1 (*)    All other components within normal limits  I-STAT CHEM 8, ED - Abnormal; Notable for the following:    Potassium 5.3 (*)    BUN 82 (*)    Creatinine, Ser 12.00 (*)    Calcium, Ion 0.83 (*)    Hemoglobin 12.6 (*)    HCT 37.0 (*)    All other components within normal limits    Imaging Review Dg Chest 2 View  08/28/2015  CLINICAL DATA:  Dyspnea and cough for 2 days. EXAM: CHEST  2 VIEW COMPARISON:  08/25/2015 FINDINGS: There is  moderate cardiomegaly, unchanged. The lungs are clear. There is no pleural effusion. The pulmonary vasculature is normal. Hilar and mediastinal contours are unremarkable and unchanged. Incidentally noted vascular stent in the left axillary region. IMPRESSION: Unchanged cardiomegaly.  No acute cardiopulmonary findings. Electronically Signed   By: Ellery Plunk M.D.   On: 08/28/2015 06:03   I have personally reviewed and evaluated these images and lab results as part of my medical decision-making.   EKG Interpretation   Date/Time:  Saturday August 28 2015 05:29:44 EDT Ventricular Rate:  69 PR Interval:  229 QRS Duration: 164 QT Interval:  496 QTC Calculation: 531 R Axis:   -64 Text Interpretation:  Sinus rhythm Prolonged PR interval Right bundle  branch block No significant change since last tracing Confirmed by Erroll Luna [phone]) on 08/28/2015 5:41:20 AM      MDM   Final diagnoses:  SOB (shortness of breath)     Patient presents to the ED for dialysis. Unfortunately he does not meet any criteria for emergent dialysis. This is explained to him at length. Patient is upset but he understands. Will return for any worsening. He was advised to try to obtain outpatient dialysis center for further care. Vital signs remain within his normal limits and he is safe for discharge.  Potassium is slightly elevated at  5.5, he was given lactulose prior to discharge.    Tomasita Crumble, MD 08/28/15 [phone]

## 2015-08-29 ENCOUNTER — Emergency Department (HOSPITAL_COMMUNITY)
Admission: EM | Admit: 2015-08-29 | Discharge: 2015-08-29 | Disposition: A | Discharge status: Home / Self Care | Payer: Medicare Other | Attending: Emergency Medicine | Admitting: Emergency Medicine

## 2015-08-29 ENCOUNTER — Emergency Department (HOSPITAL_COMMUNITY): Payer: Medicare Other

## 2015-08-29 ENCOUNTER — Encounter (HOSPITAL_COMMUNITY): Payer: Self-pay | Admitting: *Deleted

## 2015-08-29 DIAGNOSIS — Z992 Dependence on renal dialysis: Secondary | ICD-10-CM | POA: Insufficient documentation

## 2015-08-29 DIAGNOSIS — E119 Type 2 diabetes mellitus without complications: Secondary | ICD-10-CM | POA: Diagnosis not present

## 2015-08-29 DIAGNOSIS — I509 Heart failure, unspecified: Secondary | ICD-10-CM | POA: Insufficient documentation

## 2015-08-29 DIAGNOSIS — I10 Essential (primary) hypertension: Secondary | ICD-10-CM

## 2015-08-29 DIAGNOSIS — I252 Old myocardial infarction: Secondary | ICD-10-CM | POA: Diagnosis not present

## 2015-08-29 DIAGNOSIS — K219 Gastro-esophageal reflux disease without esophagitis: Secondary | ICD-10-CM | POA: Insufficient documentation

## 2015-08-29 DIAGNOSIS — Z79899 Other long term (current) drug therapy: Secondary | ICD-10-CM | POA: Insufficient documentation

## 2015-08-29 DIAGNOSIS — I120 Hypertensive chronic kidney disease with stage 5 chronic kidney disease or end stage renal disease: Secondary | ICD-10-CM | POA: Diagnosis present

## 2015-08-29 DIAGNOSIS — Z8619 Personal history of other infectious and parasitic diseases: Secondary | ICD-10-CM | POA: Diagnosis not present

## 2015-08-29 DIAGNOSIS — N186 End stage renal disease: Secondary | ICD-10-CM | POA: Diagnosis not present

## 2015-08-29 DIAGNOSIS — F17210 Nicotine dependence, cigarettes, uncomplicated: Secondary | ICD-10-CM | POA: Insufficient documentation

## 2015-08-29 LAB — CBC WITH DIFFERENTIAL/PLATELET
BASOS PCT: 1 %
Basophils Absolute: 0.1 10*3/uL (ref 0.0–0.1)
Eosinophils Absolute: 0.9 10*3/uL — ABNORMAL HIGH (ref 0.0–0.7)
Eosinophils Relative: 14 %
HEMATOCRIT: 35.2 % — AB (ref 39.0–52.0)
HEMOGLOBIN: 11.2 g/dL — AB (ref 13.0–17.0)
LYMPHS ABS: 1.2 10*3/uL (ref 0.7–4.0)
Lymphocytes Relative: 17 %
MCH: 29.5 pg (ref 26.0–34.0)
MCHC: 31.8 g/dL (ref 30.0–36.0)
MCV: 92.6 fL (ref 78.0–100.0)
MONO ABS: 0.7 10*3/uL (ref 0.1–1.0)
MONOS PCT: 10 %
NEUTROS ABS: 3.9 10*3/uL (ref 1.7–7.7)
NEUTROS PCT: 58 %
PLATELETS: 86 10*3/uL — AB (ref 150–400)
RBC: 3.8 MIL/uL — ABNORMAL LOW (ref 4.22–5.81)
RDW: 16.7 % — ABNORMAL HIGH (ref 11.5–15.5)
WBC: 6.6 10*3/uL (ref 4.0–10.5)

## 2015-08-29 LAB — BASIC METABOLIC PANEL
ANION GAP: 19 — AB (ref 5–15)
BUN: 99 mg/dL — ABNORMAL HIGH (ref 6–20)
CO2: 16 mmol/L — AB (ref 22–32)
Calcium: 6.9 mg/dL — ABNORMAL LOW (ref 8.9–10.3)
Chloride: 102 mmol/L (ref 101–111)
Creatinine, Ser: 13.02 mg/dL — ABNORMAL HIGH (ref 0.61–1.24)
GFR calc Af Amer: 4 mL/min — ABNORMAL LOW (ref >60)
GFR, EST NON AFRICAN AMERICAN: 4 mL/min — AB (ref >60)
GLUCOSE: 77 mg/dL (ref 65–99)
POTASSIUM: 5.5 mmol/L — AB (ref 3.5–5.1)
Sodium: 137 mmol/L (ref 135–145)

## 2015-08-29 MED ORDER — ALBUTEROL SULFATE (2.5 MG/3ML) 0.083% IN NEBU
5.0000 mg | INHALATION_SOLUTION | Freq: Once | RESPIRATORY_TRACT | Status: AC
  Administered 2015-08-29: 5 mg via RESPIRATORY_TRACT
  Filled 2015-08-29: qty 6

## 2015-08-29 MED ORDER — LISINOPRIL 20 MG PO TABS
40.0000 mg | ORAL_TABLET | Freq: Once | ORAL | Status: AC
  Administered 2015-08-29: 40 mg via ORAL
  Filled 2015-08-29: qty 2

## 2015-08-29 MED ORDER — ACETAMINOPHEN 500 MG PO TABS
1000.0000 mg | ORAL_TABLET | Freq: Once | ORAL | Status: AC
  Administered 2015-08-29: 1000 mg via ORAL
  Filled 2015-08-29: qty 2

## 2015-08-29 MED ORDER — HYDRALAZINE HCL 25 MG PO TABS
100.0000 mg | ORAL_TABLET | Freq: Once | ORAL | Status: AC
  Administered 2015-08-29: 100 mg via ORAL
  Filled 2015-08-29: qty 4

## 2015-08-29 MED ORDER — SPIRONOLACTONE 25 MG PO TABS
25.0000 mg | ORAL_TABLET | Freq: Once | ORAL | Status: AC
  Administered 2015-08-29: 25 mg via ORAL
  Filled 2015-08-29: qty 1

## 2015-08-29 MED ORDER — CARVEDILOL 3.125 MG PO TABS
3.1250 mg | ORAL_TABLET | Freq: Once | ORAL | Status: AC
  Administered 2015-08-29: 3.125 mg via ORAL
  Filled 2015-08-29: qty 1

## 2015-08-29 MED ORDER — AMLODIPINE BESYLATE 5 MG PO TABS
10.0000 mg | ORAL_TABLET | Freq: Once | ORAL | Status: AC
  Administered 2015-08-29: 10 mg via ORAL
  Filled 2015-08-29: qty 2

## 2015-08-29 MED ORDER — TRAMADOL HCL 50 MG PO TABS
50.0000 mg | ORAL_TABLET | Freq: Once | ORAL | Status: AC
  Administered 2015-08-29: 50 mg via ORAL
  Filled 2015-08-29: qty 1

## 2015-08-29 NOTE — ED Notes (Signed)
The pt arrived by gems    He is a dialysis pt and was here earlier today for bi-lateral swelling.  Dialysis pt  Fistula rt arm.  He was unco-operative with ems.  Angry ill tempered  Refusing to answer questions asked

## 2015-08-29 NOTE — ED Notes (Signed)
The pt has pulled  Off his pulse ox and  His bp cuff.  When he was asked why he retorted that it was hurting his arm and nobody was coming in to see him anyway.  edp in the room with him

## 2015-08-29 NOTE — ED Notes (Signed)
Pt c/o 8/10 L Hip pain, applied warm pack to area.

## 2015-08-29 NOTE — ED Provider Notes (Signed)
By signing my name below, I, Linus GalasMaharshi Patel, attest that this documentation has been prepared under the direction and in the presence of Enbridge EnergyKristen N Ward, DO. Electronically Signed: Linus GalasMaharshi Patel, ED Scribe. 08/29/2015. 1:26 AM.  TIME SEEN: 1:26 AM  CHIEF COMPLAINT:  Chief Complaint  Patient presents with  . Leg Swelling   HPI:  HPI Comments: [name] is a 60 y.o. male who presents to the Emergency Department with a PMHx of HTN, DM, and End-stage renal disease on hemodialysis complaining of BLE extremity swelling that worsened today. Pt states he needs dialysis "emergently" because he has swelling in his legs. Pt last dialysis was Wednesday 4 days ago.  Pt also reports he is "hurting all over." He is wheezing. Denies chest pain or shortness of breath. Pt denies any other complaints at this time. Pt is a smoker.  Pt has not taken his blood pressure medication this evening. It appears he is on lisinopril, aspirin lactate, hydralazine, amlodipine and Coreg.  Pt has been on dialysis for the past 10 years but was recently "kicked out". He has a right arm fistula.   ROS: See HPI Constitutional: no fever  Eyes: no drainage  ENT: no runny nose   Cardiovascular:  no chest pain  Resp: no SOB  GI: no vomiting GU: no dysuria Integumentary: no rash  Allergy: no hives  Musculoskeletal: leg swelling  Neurological: no slurred speech ROS otherwise negative  PAST MEDICAL HISTORY/PAST SURGICAL HISTORY:  Past Medical History  Diagnosis Date  . Myocardial infarction (HCC)   . Hypertension   . GERD (gastroesophageal reflux disease)   . Pulmonary edema     Just prior to starting HD and resolved with HD. ECHO in 2008 showed normal LVEF, mild diiastolic dysfunction.   . Neuropathy (HCC)     Hx: of in toes  . Hepatitis     Hc: of Hep C  . Cataracts, both eyes     Hx: of "slightly"  . Pancreatitis     Hx: of  . CHF (congestive heart failure) (HCC)   . Diabetes mellitus     not on  medication  . 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  Started HD in 2007, [facility].      MEDICATIONS:  Prior to Admission medications   Medication Sig Start Date End Date Taking? Authorizing Provider  amLODipine (NORVASC) 5 MG tablet Take 10 mg by mouth at bedtime.  11/24/14   Historical Provider, MD  b complex-vitamin c-folic acid (NEPHRO-VITE) 0.8 MG TABS Take 0.8 mg by mouth at bedtime.    Historical Provider, MD  carvedilol (COREG) 3.125 MG tablet Take 3.125 mg by mouth 2 (two) times daily. 01/08/15   Historical Provider, MD  FOSRENOL 1000 MG chewable tablet Chew 1,000 mg by mouth 5 (five) times daily. 01/06/15   Historical Provider, MD  hydrALAZINE (APRESOLINE) 100 MG tablet Take 100 mg by mouth 2 (two) times daily.  11/05/14   Historical Provider, MD  lisinopril (PRINIVIL,ZESTRIL) 40 MG tablet Take 40 mg by mouth daily.    Historical Provider, MD  PROAIR HFA 108 (90 BASE) MCG/ACT inhaler Inhale 1 puff into the lungs every 4 (four) hours as needed for wheezing. Wheezing 09/22/14   Historical Provider, MD  spironolactone (ALDACTONE) 25 MG tablet Take 25 mg by mouth at bedtime. 03/03/15   Historical Provider, MD    ALLERGIES:  Allergies  Allergen  Reactions  . Eggs Or Egg-Derived Products Other (See Comments)    "Cant Digest" " Yolks"    SOCIAL HISTORY:  Social History  Substance Use Topics  . Smoking status: Current Every Day Smoker -- 0.35 packs/day for 43 years    Types: Cigarettes    Last Attempt to Quit: 12/16/2012  . Smokeless tobacco: Never Used  . Alcohol Use: No    FAMILY HISTORY: Family History  Problem Relation Age of Onset  . Diabetes Mother     EXAM: BP 184/154 mmHg  Pulse 76  Temp(Src) 98.5 F (36.9 C) (Oral)  Resp 18  SpO2 100% CONSTITUTIONAL: Alert and oriented and responds appropriately to questions. Chronically ill appearing; well-nourished,  HEAD: Normocephalic EYES: Conjunctivae clear,  PERRL ENT: normal nose; no rhinorrhea; moist mucous membranes; pharynx without lesions noted NECK: Supple, no meningismus, no LAD  CARD: RRR; S1 and S2 appreciated; no murmurs, no clicks, no rubs, no gallops RESP: Normal chest excursion without splinting or tachypnea; breath sounds equal bilaterally; diffuse expiratory wheezes, no rhonchi, no rales, no hypoxia, no respiratory distress, speaks complete sentences.  ABD/GI: Normal bowel sounds; non-distended; soft, non-tender, no rebound, no guarding BACK:  The back appears normal and is non-tender to palpation, there is no CVA tenderness EXT: +1 pitting edema in bilateral lower extremities; normal capillary refill; no cyanosis, AV fistula in the right upper arm with palpable thrill, no surrounding eythema, warmth, swelling or tenderness, 2+ right radial pulse.    Tender to palpation over the left hip without bony deformity. Normal gait. No leg length discrepancy.  No erythema or warmth. No joint effusion.  Normal ROM in all joints; otherwise extremities are non-tender to palpation SKIN: Normal color for age and race; warm NEURO: Moves all extremities equally; sensation to light touch intact diffusely, cranial nerves II through XII intact PSYCH: The patient's mood and manner are appropriate. Grooming and personal hygiene are appropriate.  MEDICAL DECISION MAKING: Patient here with hypertension, wheezing. No significant edema appreciated in his legs. His hypertension may be secondary to noncompliance with his medications. Will obtain EKG, chest x-ray and basic blood work to see if there is any sign that he needs emergent dialysis. Have discussed with patient that he may not get dialysis tonight and may need to come back on Monday. He states he understands this.  Was seen last night for the same. Bedtime blood pressure was normal. Had potassium of 5.5 but was given Kayexalate prior to discharge.  ED PROGRESS:   3:30 AM  Patient's chest x-ray shows right  midlung airspace opacity concerning for pneumonia. Will prominent than recent study. I suspect that this is more likely edema. He denies any fevers and has no leukocytosis. Does have wheezing and cough intermittently. No hypoxia or respiratory distress. Potassium still 5.5 and he has a worsening metabolic acidosis. His blood pressure is improving. I feel he will likely need dialysis and do not think he needs it him emergently. We'll discuss with nephrology in the morning. He is requesting medication for his chronic left hip pain.  No new injury. No sign of septic arthritis or gout.  6:40 AM Pt's blood pressure has still improved and is now 142/60. He is satting 95% on room air. His lungs sound better to auscultation.  His potassium is only 5.5 with no EKG changes. He does have a metabolic acidosis likely from uremia. Discussed with Dr. Kathrene Bongo on call for nephrology who knows patient well. She states at this time he does not  meet criteria for emergent dialysis. She states he can come back tomorrow morning on Monday and she will prepare the dialysis team to take him for nonemergent dialysis. I have discussed this with patient at length and he verbalized understanding. He is calm and cooperative with me. I do not feel he needs further emergent workup.  Have advised him to come back in the morning for dialysis. Have advised him to take all of his blood pressure medications as prescribed.   At this time, I do not feel there is any life-threatening condition present. I have reviewed and discussed all results (EKG, imaging, lab, urine as appropriate), exam findings with patient. I have reviewed nursing notes and appropriate previous records.  I feel the patient is safe to be discharged home without further emergent workup. Discussed usual and customary return precautions. Patient and family (if present) verbalize understanding and are comfortable with this plan.  Patient will follow-up with their primary care  provider. If they do not have a primary care provider, information for follow-up has been provided to them. All questions have been answered.     EKG Interpretation  Date/Time:  Sunday August 29 2015 02:21:05 EDT Ventricular Rate:  66 PR Interval:  223 QRS Duration: 165 QT Interval:  503 QTC Calculation: 527 R Axis:   -45 Text Interpretation:  Sinus rhythm Atrial premature complexes Prolonged PR interval Right bundle branch block Abnormal inferior Q waves NO SIGNIFICANT CHANGE SINCE LAST TRACING YESTERDAY Confirmed by WARD,  DO, KRISTEN ([id number]) on 08/29/2015 2:23:43 AM       I personally performed the services described in this documentation, which was scribed in my presence. The recorded information has been reviewed and is accurate.        Layla Maw Ward, DO 08/29/15 [phone]

## 2015-08-29 NOTE — Discharge Instructions (Signed)
Please take your blood pressure medication as prescribed. Please follow-up in the emergency department tomorrow morning for dialysis. You do not meet criteria for emergent dialysis today. We have discussed your care with nephrology.   Chronic Kidney Disease Chronic kidney disease occurs when the kidneys are damaged over a long period. The kidneys are two organs that lie on either side of the spine between the middle of the back and the front of the abdomen. The kidneys:  Remove wastes and extra water from the blood.  Produce important hormones. These help keep bones strong, regulate blood pressure, and help create red blood cells.  Balance the fluids and chemicals in the blood and tissues. A small amount of kidney damage may not cause problems, but a large amount of damage may make it difficult or impossible for the kidneys to work the way they should. If steps are not taken to slow down the kidney damage or stop it from getting worse, the kidneys may stop working permanently. Most of the time, chronic kidney disease does not go away. However, it can often be controlled, and those with the disease can usually live normal lives. CAUSES The most common causes of chronic kidney disease are diabetes and high blood pressure (hypertension). Chronic kidney disease may also be caused by:  Diseases that cause the kidneys' filters to become inflamed.  Diseases that affect the immune system.  Genetic diseases.  Medicines that damage the kidneys, such as anti-inflammatory medicines.  Poisoning or exposure to toxic substances.  A reoccurring kidney or urinary infection.  A problem with urine flow. This may be caused by:  Cancer.  Kidney stones.  An enlarged prostate in males. SIGNS AND SYMPTOMS Because the kidney damage in chronic kidney disease occurs slowly, symptoms develop slowly and may not be obvious until the kidney damage becomes severe. A person may have a kidney disease for years  without showing any symptoms. Symptoms can include:  Swelling (edema) of the legs, ankles, or feet.  Tiredness (lethargy).  Nausea or vomiting.  Confusion.  Problems with urination, such as:  Decreased urine production.  Frequent urination, especially at night.  Frequent accidents in children who are potty trained.  Muscle twitches and cramps.  Shortness of breath.  Weakness.  Persistent itchiness.  Loss of appetite.  Metallic taste in the mouth.  Trouble sleeping.  Slowed development in children.  Short stature in children. DIAGNOSIS Chronic kidney disease may be detected and diagnosed by tests, including blood, urine, imaging, or kidney biopsy tests. TREATMENT Most chronic kidney diseases cannot be cured. Treatment usually involves relieving symptoms and preventing or slowing the progression of the disease. Treatment may include:  A special diet. You may need to avoid alcohol and foods thatare salty and high in potassium.  Medicines. These may:  Lower blood pressure.  Relieve anemia.  Relieve swelling.  Protect the bones. HOME CARE INSTRUCTIONS  Follow your prescribed diet. Your health care provider may instruct you to limit daily salt (sodium) and protein intake.  Take medicines only as directed by your health care provider. Do not take any new medicines (prescription, over-the-counter, or nutritional supplements) unless approved by your health care provider. Many medicines can worsen your kidney damage or need to have the dose adjusted.   Quit smoking if you smoke. Talk to your health care provider about a smoking cessation program.  Keep all follow-up visits as directed by your health care provider.  Monitor your blood pressure.  Start or continue an exercise plan.  Get immunizations as directed by your health care provider.  Take vitamin and mineral supplements as directed by your health care provider. SEEK IMMEDIATE MEDICAL CARE  IF:  Your symptoms get worse or you develop new symptoms.  You develop symptoms of end-stage kidney disease. These include:  Headaches.  Abnormally dark or light skin.  Numbness in the hands or feet.  Easy bruising.  Frequent hiccups.  Menstruation stops.  You have a fever.  You have decreased urine production.  You havepain or bleeding when urinating. MAKE SURE YOU:  Understand these instructions.  Will watch your condition.  Will get help right away if you are not doing well or get worse. FOR MORE INFORMATION   American Association of Kidney Patients: ResidentialShow.is  National Kidney Foundation: www.kidney.org  American Kidney Fund: FightingMatch.com.ee  [facility]: www.lifeoptions.org and www.kidneyschool.org   This information is not intended to replace advice given to you by your health care provider. Make sure you discuss any questions you have with your health care provider.   Document Released: 02/08/2008 Document Revised: 05/22/2014 Document Reviewed: 12/29/2011 Elsevier Interactive Patient Education 2016 Elsevier Inc.  Dialysis Diet Dialysis is a treatment that you may undergo if you have significant damage to your kidneys. Dialysis replaces some of the work that the kidneys do. One of the jobs that it takes over is removing wastes, salt, and extra water from your blood. This helps to keep the amount of potassium and other nutrients in your blood at healthy levels. When you need dialysis, it is important to pay careful attention to your diet. Between dialysis sessions, certain nutrients can build up in your blood and cause you to get sick. Vitamins and minerals are an important part of a healthy diet and should not be avoided entirely. However, it is commonly recommended that you limit your intake of potassium, phosphorus, and sodium. It may also be necessary to restrict other nutrients, such as carbohydrates or fat, if you have other health  conditions. Your health care provider or dietitian can help you to determine the amount of these nutrients that is right for you. WHAT IS MY PLAN? Your dietitian will help you to design a meal plan that is specific to your needs. Generally, meal plans include:  Grains, 6-11 servings per day. One serving is equal to 1 slice of bread or  cup of cooked rice or pasta.  Low-potassium vegetables, 2-3 servings per day. One serving is equal to  cup.  Low-potassium fruits, 2-3 servings per day. One serving is equal to  cup.  Meats and other protein sources, 8-11 oz per day.  Dairy,  cup per day. Your dietitian will provide you with specific instructions about the amount of fluids you can have each day. WHAT DO I NEED TO KNOW ABOUT A DIALYSIS DIET?  Limit your intake of potassium. Potassium is found in milk, fruits, and vegetables.  Limit your intake of phosphorus. Phosphorus is found in milk, cheese, beans, nuts, and carbonated beverages. Avoid whole-grain and high-fiber foods because they contain high amounts of phosphorus.  Limit your intake of sodium. Foods that are high in sodium include processed and cured meats, ready-made frozen meals, canned vegetables, and salty snack foods. Do not use salt substitutes because they contain potassium.  If you were instructed to restrict your fluid intake, follow your health care provider's specific instructions. You may be told to:  Write down what you drink and any foods you eat that are made mostly from water,  such as gelatin and soups.  Drink from small cups to help control how much you drink.  Ask your health care provider if you should regularly take an over-the-counter medicine that binds phosphorus, such as antacid products that contain calcium carbonate.  Take vitamin and mineral supplements only as directed by your health care provider.  Eat high-quality proteins, such as meat, poultry, fish, and eggs. Limit low-quality plant-based  proteins, such as nuts and beans.  Cut potatoes into small pieces and boil them in unsalted water before you eat them. This can help to remove some potassium from the potato.  Drain all fluid from cooked vegetables and canned fruits before eating them. WHAT FOODS CAN I EAT? Grains White bread. White rice. Cooked cereal. Unsalted popcorn. Tortillas. Pasta. Vegetables Fresh or frozen broccoli, carrots, and green beans. Cabbage. Cauliflower. Celery. Cucumbers. Eggplant. Radishes. Zucchini. Fruits Apples. Fresh or frozen berries. Fresh or canned pears, peaches, and pineapple. Grapes. Plums. Meats and Other Protein Sources Fresh or frozen beef, pork, chicken, and fish. Eggs. Low-sodium canned tuna or salmon. Dairy Cream cheese. Heavy cream. Ricotta cheese. Beverages Apple cider. Cranberry juice. Grape juice. Lemonade. Black coffee. Condiments Herbs. Spices. Jam and jelly. Honey. Sweets and Desserts Sherbet. Cakes. Cookies. Fats and Oils Olive oil, canola oil, and safflower oil. Other Non-dairy creamer. Non-dairy whipped topping. Homemade broth without salt. The items listed above may not be a complete list of recommended foods or beverages. Contact your dietitian for more options.  WHAT FOODS ARE NOT RECOMMENDED? Grains Whole-grain bread. Whole-grain pasta. High-fiber cereal. Vegetables Potatoes. Beets. Tomatoes. Winter squash and pumpkin. Asparagus. Spinach. Parsnips. Fruits Star fruit. Bananas. Oranges. Kiwi. Nectarines. Prunes. Melon. Dried fruit. Avocado. Meats and Other Protein Sources Canned, smoked, and cured meats. Occupational hygienist. Sardines. Nuts and seeds. Peanut butter. Beans and legumes. Dairy Milk. Buttermilk. Yogurt. Cheese and cottage cheese. Processed cheese spreads. Beverages Orange juice. Prune juice. Carbonated soft drinks. Condiments Salt. Salt substitutes. Soy sauce. Sweets and Desserts Ice cream. Chocolate. Candied nuts. Fats and Oils Butter.  Margarine. Other Ready-made frozen meals. Canned soups. The items listed above may not be a complete list of foods and beverages to avoid. Contact your dietitian for more information.   This information is not intended to replace advice given to you by your health care provider. Make sure you discuss any questions you have with your health care provider.   Document Released: 01/27/2004 Document Revised: 05/22/2014 Document Reviewed: 12/02/2013 Elsevier Interactive Patient Education 2016 [employer].  Hypertension Hypertension, commonly called high blood pressure, is when the force of blood pumping through your arteries is too strong. Your arteries are the blood vessels that carry blood from your heart throughout your body. A blood pressure reading consists of a higher number over a lower number, such as 110/72. The higher number (systolic) is the pressure inside your arteries when your heart pumps. The lower number (diastolic) is the pressure inside your arteries when your heart relaxes. Ideally you want your blood pressure below 120/80. Hypertension forces your heart to work harder to pump blood. Your arteries may become narrow or stiff. Having untreated or uncontrolled hypertension can cause heart attack, stroke, kidney disease, and other problems. RISK FACTORS Some risk factors for high blood pressure are controllable. Others are not.  Risk factors you cannot control include:   Race. You may be at higher risk if you are African American.  Age. Risk increases with age.  Gender. Men are at higher risk than women before age 60  years. After age 12, women are at higher risk than men. Risk factors you can control include:  Not getting enough exercise or physical activity.  Being overweight.  Getting too much fat, sugar, calories, or salt in your diet.  Drinking too much alcohol. SIGNS AND SYMPTOMS Hypertension does not usually cause signs or symptoms. Extremely high blood pressure  (hypertensive crisis) may cause headache, anxiety, shortness of breath, and nosebleed. DIAGNOSIS To check if you have hypertension, your health care provider will measure your blood pressure while you are seated, with your arm held at the level of your heart. It should be measured at least twice using the same arm. Certain conditions can cause a difference in blood pressure between your right and left arms. A blood pressure reading that is higher than normal on one occasion does not mean that you need treatment. If it is not clear whether you have high blood pressure, you may be asked to return on a different day to have your blood pressure checked again. Or, you may be asked to monitor your blood pressure at home for 1 or more weeks. TREATMENT Treating high blood pressure includes making lifestyle changes and possibly taking medicine. Living a healthy lifestyle can help lower high blood pressure. You may need to change some of your habits. Lifestyle changes may include:  Following the DASH diet. This diet is high in fruits, vegetables, and whole grains. It is low in salt, red meat, and added sugars.  Keep your sodium intake below 2,300 mg per day.  Getting at least 30-45 minutes of aerobic exercise at least 4 times per week.  Losing weight if necessary.  Not smoking.  Limiting alcoholic beverages.  Learning ways to reduce stress. Your health care provider may prescribe medicine if lifestyle changes are not enough to get your blood pressure under control, and if one of the following is true:  You are 19-26 years of age and your systolic blood pressure is above 140.  You are 51 years of age or older, and your systolic blood pressure is above 150.  Your diastolic blood pressure is above 90.  You have diabetes, and your systolic blood pressure is over 140 or your diastolic blood pressure is over 90.  You have kidney disease and your blood pressure is above 140/90.  You have heart disease  and your blood pressure is above 140/90. Your personal target blood pressure may vary depending on your medical conditions, your age, and other factors. HOME CARE INSTRUCTIONS  Have your blood pressure rechecked as directed by your health care provider.   Take medicines only as directed by your health care provider. Follow the directions carefully. Blood pressure medicines must be taken as prescribed. The medicine does not work as well when you skip doses. Skipping doses also puts you at risk for problems.  Do not smoke.   Monitor your blood pressure at home as directed by your health care provider. SEEK MEDICAL CARE IF:   You think you are having a reaction to medicines taken.  You have recurrent headaches or feel dizzy.  You have swelling in your ankles.  You have trouble with your vision. SEEK IMMEDIATE MEDICAL CARE IF:  You develop a severe headache or confusion.  You have unusual weakness, numbness, or feel faint.  You have severe chest or abdominal pain.  You vomit repeatedly.  You have trouble breathing. MAKE SURE YOU:   Understand these instructions.  Will watch your condition.  Will get help right  away if you are not doing well or get worse.   This information is not intended to replace advice given to you by your health care provider. Make sure you discuss any questions you have with your health care provider.   Document Released: 05/01/2005 Document Revised: 09/15/2014 Document Reviewed: 02/21/2013 Elsevier Interactive Patient Education [employer]

## 2015-08-30 ENCOUNTER — Non-Acute Institutional Stay (HOSPITAL_COMMUNITY)
Admission: EM | Admit: 2015-08-30 | Discharge: 2015-08-30 | Disposition: A | Discharge status: Home / Self Care | Payer: Medicare Other | Attending: Emergency Medicine | Admitting: Emergency Medicine

## 2015-08-30 ENCOUNTER — Encounter (HOSPITAL_COMMUNITY): Payer: Self-pay | Admitting: Family Medicine

## 2015-08-30 DIAGNOSIS — Z79899 Other long term (current) drug therapy: Secondary | ICD-10-CM | POA: Diagnosis not present

## 2015-08-30 DIAGNOSIS — F17210 Nicotine dependence, cigarettes, uncomplicated: Secondary | ICD-10-CM | POA: Diagnosis not present

## 2015-08-30 DIAGNOSIS — N186 End stage renal disease: Secondary | ICD-10-CM | POA: Diagnosis present

## 2015-08-30 DIAGNOSIS — K219 Gastro-esophageal reflux disease without esophagitis: Secondary | ICD-10-CM | POA: Diagnosis not present

## 2015-08-30 DIAGNOSIS — I132 Hypertensive heart and chronic kidney disease with heart failure and with stage 5 chronic kidney disease, or end stage renal disease: Secondary | ICD-10-CM | POA: Diagnosis not present

## 2015-08-30 DIAGNOSIS — E1122 Type 2 diabetes mellitus with diabetic chronic kidney disease: Secondary | ICD-10-CM | POA: Diagnosis not present

## 2015-08-30 DIAGNOSIS — R197 Diarrhea, unspecified: Secondary | ICD-10-CM | POA: Insufficient documentation

## 2015-08-30 DIAGNOSIS — Z992 Dependence on renal dialysis: Secondary | ICD-10-CM | POA: Insufficient documentation

## 2015-08-30 DIAGNOSIS — I509 Heart failure, unspecified: Secondary | ICD-10-CM | POA: Diagnosis not present

## 2015-08-30 DIAGNOSIS — I252 Old myocardial infarction: Secondary | ICD-10-CM | POA: Insufficient documentation

## 2015-08-30 DIAGNOSIS — E1140 Type 2 diabetes mellitus with diabetic neuropathy, unspecified: Secondary | ICD-10-CM | POA: Diagnosis not present

## 2015-08-30 DIAGNOSIS — N19 Unspecified kidney failure: Secondary | ICD-10-CM | POA: Diagnosis present

## 2015-08-30 LAB — I-STAT CHEM 8, ED
BUN: 107 mg/dL — AB (ref 6–20)
CREATININE: 15.1 mg/dL — AB (ref 0.61–1.24)
Calcium, Ion: 0.71 mmol/L — ABNORMAL LOW (ref 1.12–1.23)
Chloride: 107 mmol/L (ref 101–111)
GLUCOSE: 175 mg/dL — AB (ref 65–99)
HCT: 41 % (ref 39.0–52.0)
Hemoglobin: 13.9 g/dL (ref 13.0–17.0)
Potassium: 5.8 mmol/L — ABNORMAL HIGH (ref 3.5–5.1)
Sodium: 136 mmol/L (ref 135–145)
TCO2: 16 mmol/L (ref 0–100)

## 2015-08-30 MED ORDER — HEPARIN SODIUM (PORCINE) 1000 UNIT/ML DIALYSIS: 1000.0000 [IU] | INTRAMUSCULAR | Status: DC | PRN

## 2015-08-30 MED ORDER — DIPHENOXYLATE-ATROPINE 2.5-0.025 MG PO TABS
2.0000 | ORAL_TABLET | Freq: Once | ORAL | Status: AC
  Administered 2015-08-30: 2 via ORAL
  Filled 2015-08-30: qty 2

## 2015-08-30 MED ORDER — LIDOCAINE-PRILOCAINE 2.5-2.5 % EX CREA: 1.0000 "application " | TOPICAL_CREAM | CUTANEOUS | Status: DC | PRN

## 2015-08-30 MED ORDER — SODIUM CHLORIDE 0.9 % IV SOLN
100.0000 mL | INTRAVENOUS | Status: DC | PRN
Start: 2015-08-30 — End: 2015-08-30

## 2015-08-30 MED ORDER — ALTEPLASE 2 MG IJ SOLR
2.0000 mg | Freq: Once | INTRAMUSCULAR | Status: DC | PRN
Start: 2015-08-30 — End: 2015-08-30

## 2015-08-30 MED ORDER — CLONIDINE HCL 0.1 MG PO TABS
0.1000 mg | ORAL_TABLET | Freq: Once | ORAL | Status: DC
  Filled 2015-08-30: qty 1

## 2015-08-30 MED ORDER — PENTAFLUOROPROP-TETRAFLUOROETH EX AERO: 1.0000 "application " | INHALATION_SPRAY | CUTANEOUS | Status: DC | PRN

## 2015-08-30 MED ORDER — LIDOCAINE HCL (PF) 1 % IJ SOLN: 5.0000 mL | INTRAMUSCULAR | Status: DC | PRN

## 2015-08-30 MED ORDER — SODIUM CHLORIDE 0.9 % IV SOLN: 100.0000 mL | INTRAVENOUS | Status: DC | PRN
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
## 2015-08-30 NOTE — ED Notes (Signed)
Report called to Dialysis.  Aware of holding Clonidine per Lockie ParesJaclyn RN.

## 2015-08-30 NOTE — ED Notes (Signed)
Pt presents with c/o diarrhea x1wk and requests dialysis - last dialysis was last Wednesday. Denies N/V/C.

## 2015-08-30 NOTE — Progress Notes (Signed)
Patient discharged to home after hemodialysis treatment complete per Dr. Arlean HoppingSchertz 4.5L removed without complications, VSS, denies pain.  Patient has all belongings including cane and escorted to elevator.

## 2015-08-30 NOTE — Procedures (Signed)
  I was present at this dialysis session, have reviewed the session itself and made  appropriate changes Vinson Moselleob Delorice Bannister MD [facility] pager [phone]    cell [phone] 08/30/2015, 4:41 PM

## 2015-09-01 ENCOUNTER — Non-Acute Institutional Stay (HOSPITAL_COMMUNITY)
Admission: EM | Admit: 2015-09-01 | Discharge: 2015-09-01 | Disposition: A | Discharge status: Home / Self Care | Payer: Medicare Other | Attending: Emergency Medicine | Admitting: Emergency Medicine

## 2015-09-01 ENCOUNTER — Encounter (HOSPITAL_COMMUNITY): Payer: Self-pay | Admitting: *Deleted

## 2015-09-01 DIAGNOSIS — I509 Heart failure, unspecified: Secondary | ICD-10-CM | POA: Insufficient documentation

## 2015-09-01 DIAGNOSIS — I132 Hypertensive heart and chronic kidney disease with heart failure and with stage 5 chronic kidney disease, or end stage renal disease: Secondary | ICD-10-CM | POA: Insufficient documentation

## 2015-09-01 DIAGNOSIS — N186 End stage renal disease: Secondary | ICD-10-CM | POA: Insufficient documentation

## 2015-09-01 DIAGNOSIS — Z992 Dependence on renal dialysis: Secondary | ICD-10-CM | POA: Insufficient documentation

## 2015-09-01 DIAGNOSIS — F17210 Nicotine dependence, cigarettes, uncomplicated: Secondary | ICD-10-CM | POA: Insufficient documentation

## 2015-09-01 DIAGNOSIS — E1140 Type 2 diabetes mellitus with diabetic neuropathy, unspecified: Secondary | ICD-10-CM | POA: Diagnosis not present

## 2015-09-01 DIAGNOSIS — I252 Old myocardial infarction: Secondary | ICD-10-CM | POA: Diagnosis not present

## 2015-09-01 DIAGNOSIS — E875 Hyperkalemia: Secondary | ICD-10-CM | POA: Diagnosis present

## 2015-09-01 LAB — CBC
HEMATOCRIT: 33.9 % — AB (ref 39.0–52.0)
HEMOGLOBIN: 11.2 g/dL — AB (ref 13.0–17.0)
MCH: 30.8 pg (ref 26.0–34.0)
MCHC: 33 g/dL (ref 30.0–36.0)
MCV: 93.1 fL (ref 78.0–100.0)
Platelets: 65 10*3/uL — ABNORMAL LOW (ref 150–400)
RBC: 3.64 MIL/uL — ABNORMAL LOW (ref 4.22–5.81)
RDW: 16.5 % — ABNORMAL HIGH (ref 11.5–15.5)
WBC: 7.8 10*3/uL (ref 4.0–10.5)

## 2015-09-01 LAB — BASIC METABOLIC PANEL
Anion gap: 16 — ABNORMAL HIGH (ref 5–15)
BUN: 62 mg/dL — AB (ref 6–20)
CHLORIDE: 103 mmol/L (ref 101–111)
CO2: 20 mmol/L — AB (ref 22–32)
CREATININE: 10.72 mg/dL — AB (ref 0.61–1.24)
Calcium: 6.1 mg/dL — CL (ref 8.9–10.3)
GFR calc Af Amer: 5 mL/min — ABNORMAL LOW (ref >60)
GFR calc non Af Amer: 5 mL/min — ABNORMAL LOW (ref >60)
GLUCOSE: 78 mg/dL (ref 65–99)
Potassium: 4.7 mmol/L (ref 3.5–5.1)
SODIUM: 139 mmol/L (ref 135–145)

## 2015-09-01 NOTE — ED Notes (Signed)
Renal breakfast tray ordered ay 0824am.
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
## 2015-09-01 NOTE — H&P (Signed)
Referring Provider: No ref. provider found Primary Care Physician:  Rinaldo Cloud, MD Primary Nephrologist:  Dr. Brent General  Reason for Consultation:  Patient discharged from dialysis clinic and comes to ER for his treatments  Has history of non compliance   HPI:  Patient comes to ER for dialysis some mild dyspnea no dialysis since last week. No acute chest pain or PND has orthopnea  Past Medical History  Diagnosis Date  . Myocardial infarction (HCC)   . Hypertension   . GERD (gastroesophageal reflux disease)   . Pulmonary edema     Just prior to starting HD and resolved with HD. ECHO in 2008 showed normal LVEF, mild diiastolic dysfunction.   . Neuropathy (HCC)     Hx: of in toes  . Hepatitis     Hc: of Hep C  . Cataracts, both eyes     Hx: of "slightly"  . Pancreatitis     Hx: of  . CHF (congestive heart failure) (HCC)   . Diabetes mellitus     not on medication  . 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  Started HD in 2007, gets HD at [facility] on MWF schedule.      Past Surgical History  Procedure Laterality Date  . Dialysis fistula creation Left   . Cholecystectomy    . Colonoscopy      Hx: of  . Ligation of arteriovenous  fistula Left 11/06/2012    Procedure: PLICATION OF BRACHIO-CEPHALIC ARTERIOVENOUS  FISTULA;  Surgeon: Fransisco Hertz, MD;  Location: [facility] OR;  Service: Vascular;  Laterality: Left;  . Pars plana vitrectomy Right 11/10/2014    Procedure: PARS PLANA VITRECTOMY WITH 25 GAUGE;  Surgeon: Sherrie George, MD;  Location: [facility] OR;  Service: Ophthalmology;  Laterality: Right;  . Membrane peel Right 11/10/2014    Procedure: MEMBRANE PEEL;  Surgeon: Sherrie George, MD;  Location: [facility] OR;  Service: Ophthalmology;  Laterality: Right;  . Gas/fluid exchange Right 11/10/2014    Procedure: GAS/FLUID EXCHANGE;  Surgeon: Sherrie George, MD;  Location: [facility] OR;  Service: Ophthalmology;  Laterality: Right;  . Laser photo ablation Right 11/10/2014   Procedure: LASER PHOTO ABLATION;  Surgeon: Sherrie George, MD;  Location: [facility] OR;  Service: Ophthalmology;  Laterality: Right;  Endo Laser    Prior to Admission medications   Medication Sig Start Date End Date Taking? Authorizing Provider  amLODipine (NORVASC) 5 MG tablet Take 10 mg by mouth at bedtime.  11/24/14  Yes Historical Provider, MD  b complex-vitamin c-folic acid (NEPHRO-VITE) 0.8 MG TABS Take 0.8 mg by mouth at bedtime.   Yes Historical Provider, MD  carvedilol (COREG) 3.125 MG tablet Take 3.125 mg by mouth 2 (two) times daily. 01/08/15  Yes Historical Provider, MD  FOSRENOL 1000 MG chewable tablet Chew 1,000 mg by mouth 5 (five) times daily. 01/06/15  Yes Historical Provider, MD  hydrALAZINE (APRESOLINE) 100 MG tablet Take 100 mg by mouth 2 (two) times daily.  11/05/14  Yes Historical Provider, MD  lisinopril (PRINIVIL,ZESTRIL) 40 MG tablet Take 40 mg by mouth 2 (two) times daily.    Yes Historical Provider, MD  PROAIR HFA 108 (90 BASE) MCG/ACT inhaler Inhale 1 puff into the lungs every 4 (four) hours as needed for wheezing. Wheezing 09/22/14  Yes Historical Provider, MD  spironolactone (ALDACTONE) 25 MG tablet Take 25 mg by mouth at bedtime. 03/03/15  Yes Historical Provider, MD    No  current facility-administered medications for this encounter.   Current Outpatient Prescriptions  Medication Sig Dispense Refill  . amLODipine (NORVASC) 5 MG tablet Take 10 mg by mouth at bedtime.     Marland Kitchen b complex-vitamin c-folic acid (NEPHRO-VITE) 0.8 MG TABS Take 0.8 mg by mouth at bedtime.    . carvedilol (COREG) 3.125 MG tablet Take 3.125 mg by mouth 2 (two) times daily.    Marland Kitchen FOSRENOL 1000 MG chewable tablet Chew 1,000 mg by mouth 5 (five) times daily.    . hydrALAZINE (APRESOLINE) 100 MG tablet Take 100 mg by mouth 2 (two) times daily.   11  . lisinopril (PRINIVIL,ZESTRIL) 40 MG tablet Take 40 mg by mouth 2 (two) times daily.     Marland Kitchen PROAIR HFA 108 (90 BASE) MCG/ACT inhaler Inhale 1 puff into the  lungs every 4 (four) hours as needed for wheezing. Wheezing    . spironolactone (ALDACTONE) 25 MG tablet Take 25 mg by mouth at bedtime.  5    Allergies as of 08/25/2015 - Review Complete 08/25/2015  Allergen Reaction Noted  . Eggs or egg-derived products Other (See Comments) 10/20/2013    Family History  Problem Relation Age of Onset  . Diabetes Mother     Social History   Social History  . Marital Status: Divorced    Spouse Name: N/A  . Number of Children: N/A  . Years of Education: N/A   Occupational History  . Not on file.   Social History Main Topics  . Smoking status: Current Every Day Smoker -- 0.35 packs/day for 43 years    Types: Cigarettes    Last Attempt to Quit: 12/16/2012  . Smokeless tobacco: Never Used  . Alcohol Use: No  . Drug Use: Yes    Special: Cocaine, Marijuana  . Sexual Activity: Not Currently   Other Topics Concern  . Not on file   Social History Narrative    Review of Systems:   Physical Exam: Vital signs in last 24 hours: Temp:  [97.4 F (36.3 C)-98.7 F (37.1 C)] 98.7 F (37.1 C) (04/19 1515) Pulse Rate:  [66-76] 72 (04/19 1515) Resp:  [16] 16 (04/19 1515) BP: (108-169)/(63-102) 169/81 mmHg (04/19 1515) SpO2:  [95 %-100 %] 100 % (04/19 1515) Weight:  [73.3 kg (161 lb 9.6 oz)-75.3 kg (166 lb 0.1 oz)] 73.3 kg (161 lb 9.6 oz) (04/19 1515)   General:   Alert,  Well-developed, well-nourished, pleasant and cooperative in NAD Head:  Normocephalic and atraumatic. Eyes:  Sclera clear, no icterus.   Conjunctiva pink. Ears:  Normal auditory acuity. Nose:  No deformity, discharge,  or lesions. Mouth:  No deformity or lesions, dentition normal. Neck:  Supple; no masses or thyromegaly. JVP not elevated Lungs:  Clear throughout to auscultation.   No wheezes, crackles, or rhonchi. No acute distress. Heart:  Regular rate and rhythm; no murmurs, clicks, rubs,  or gallops. Abdomen:  Soft, nontender and nondistended. No masses,  hepatosplenomegaly or hernias noted. Normal bowel sounds, without guarding, and without rebound.   Msk:  Symmetrical without gross deformities. Normal posture. Pulses:  No carotid, renal, femoral bruits. DP and PT symmetrical and equal Extremities:  Without clubbing or edema. Neurologic:  Alert and  oriented x4;  grossly normal neurologically. Skin:  Intact without significant lesions or rashes. Cervical Nodes:  No significant cervical adenopathy. Psych:  Alert and cooperative. Normal mood and affect.  Intake/Output from previous day:   Intake/Output this shift:    Lab Results:  Recent Labs  08/30/15 1212 09/01/15 0403  WBC  --  7.8  HGB 13.9 11.2*  HCT 41.0 33.9*  PLT  --  65*   BMET  Recent Labs  08/30/15 1212 09/01/15 0403  NA 136 139  K 5.8* 4.7  CL 107 103  CO2  --  20*  GLUCOSE 175* 78  BUN 107* 62*  CREATININE 15.10* 10.72*  CALCIUM  --  6.1*   LFT No results for input(s): PROT, ALBUMIN, AST, ALT, ALKPHOS, BILITOT, BILIDIR, IBILI in the last 72 hours. PT/INR No results for input(s): LABPROT, INR in the last 72 hours. Hepatitis Panel No results for input(s): HEPBSAG, HCVAB, HEPAIGM, HEPBIGM in the last 72 hours.  Studies/Results: No results found.  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

## 2015-09-01 NOTE — ED Provider Notes (Signed)
CSN: [id number]     Arrival date & time 09/01/15  0236 History   First MD Initiated Contact with Patient 09/01/15 0315     Chief Complaint  Patient presents with  . Vascular Access Problem     HPI Patient presents to emergency department requesting dialysis.  He does not have a dialysis home at this time and therefore he routinely comes the emergency department for dialysis.  His last dialysis was 2 days ago.  He has no focal complaint this time.  He denies chest pain or shortness of breath.   Past Medical History  Diagnosis Date  . Myocardial infarction (HCC)   . Hypertension   . GERD (gastroesophageal reflux disease)   . Pulmonary edema     Just prior to starting HD and resolved with HD. ECHO in 2008 showed normal LVEF, mild diiastolic dysfunction.   . Neuropathy (HCC)     Hx: of in toes  . Hepatitis     Hc: of Hep C  . Cataracts, both eyes     Hx: of "slightly"  . Pancreatitis     Hx: of  . CHF (congestive heart failure) (HCC)   . Diabetes mellitus     not on medication  . 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  Started HD in 2007, gets HD at [facility] on MWF schedule.     Past Surgical History  Procedure Laterality Date  . Dialysis fistula creation Left   . Cholecystectomy    . Colonoscopy      Hx: of  . Ligation of arteriovenous  fistula Left 11/06/2012    Procedure: PLICATION OF BRACHIO-CEPHALIC ARTERIOVENOUS  FISTULA;  Surgeon: Fransisco Hertz, MD;  Location: [facility] OR;  Service: Vascular;  Laterality: Left;  . Pars plana vitrectomy Right 11/10/2014    Procedure: PARS PLANA VITRECTOMY WITH 25 GAUGE;  Surgeon: Sherrie George, MD;  Location: [facility] OR;  Service: Ophthalmology;  Laterality: Right;  . Membrane peel Right 11/10/2014    Procedure: MEMBRANE PEEL;  Surgeon: Sherrie George, MD;  Location: [facility] OR;  Service: Ophthalmology;  Laterality: Right;  . Gas/fluid exchange Right 11/10/2014    Procedure: GAS/FLUID EXCHANGE;  Surgeon: Sherrie George, MD;  Location: [facility] OR;  Service: Ophthalmology;  Laterality: Right;  . Laser photo ablation Right 11/10/2014    Procedure: LASER PHOTO ABLATION;  Surgeon: Sherrie George, MD;  Location: [facility] OR;  Service: Ophthalmology;  Laterality: Right;  Endo Laser   Family History  Problem Relation Age of Onset  . Diabetes Mother    Social History  Substance Use Topics  . Smoking status: Current Every Day Smoker -- 0.35 packs/day for 43 years    Types: Cigarettes    Last Attempt to Quit: 12/16/2012  . Smokeless tobacco: Never Used  . Alcohol Use: No    Review of Systems  All other systems reviewed and are negative.     Allergies  Eggs or egg-derived products  Home Medications   Prior to Admission medications   Medication Sig Start Date End Date Taking? Authorizing Provider  amLODipine (NORVASC) 5 MG tablet Take 10 mg by mouth at bedtime.  11/24/14   Historical Provider, MD  b complex-vitamin c-folic acid (NEPHRO-VITE) 0.8 MG TABS Take 0.8 mg by mouth at bedtime.    Historical Provider, MD  carvedilol (COREG) 3.125 MG tablet Take 3.125 mg by mouth 2 (two) times daily. 01/08/15   Historical  Provider, MD  FOSRENOL 1000 MG chewable tablet Chew 1,000 mg by mouth 5 (five) times daily. 01/06/15   Historical Provider, MD  hydrALAZINE (APRESOLINE) 100 MG tablet Take 100 mg by mouth 2 (two) times daily.  11/05/14   Historical Provider, MD  lisinopril (PRINIVIL,ZESTRIL) 40 MG tablet Take 40 mg by mouth 2 (two) times daily.     Historical Provider, MD  PROAIR HFA 108 (90 BASE) MCG/ACT inhaler Inhale 1 puff into the lungs every 4 (four) hours as needed for wheezing. Wheezing 09/22/14   Historical Provider, MD  spironolactone (ALDACTONE) 25 MG tablet Take 25 mg by mouth at bedtime. 03/03/15   Historical Provider, MD   BP 162/88 mmHg  Pulse 72  Temp(Src) 97.4 F (36.3 C) (Oral)  Resp 16  SpO2 100% Physical Exam  Constitutional: He is oriented to person, place, and time. He appears well-developed  and well-nourished.  HENT:  Head: Normocephalic.  Eyes: EOM are normal.  Neck: Normal range of motion.  Cardiovascular: Normal rate.   Pulmonary/Chest: Effort normal and breath sounds normal.  Abdominal: He exhibits no distension.  Musculoskeletal: Normal range of motion.  Neurological: He is alert and oriented to person, place, and time.  Psychiatric: He has a normal mood and affect.  Nursing note and vitals reviewed.   ED Course  Procedures (including critical care time) Labs Review Labs Reviewed  CBC - Abnormal; Notable for the following:    RBC 3.64 (*)    Hemoglobin 11.2 (*)    HCT 33.9 (*)    RDW 16.5 (*)    Platelets 65 (*)    All other components within normal limits  BASIC METABOLIC PANEL - Abnormal; Notable for the following:    CO2 20 (*)    BUN 62 (*)    Creatinine, Ser 10.72 (*)    Calcium 6.1 (*)    GFR calc non Af Amer 5 (*)    GFR calc Af Amer 5 (*)    Anion gap 16 (*)    All other components within normal limits    Imaging Review No results found. I have personally reviewed and evaluated these images and lab results as part of my medical decision-making.   EKG Interpretation None      MDM   Final diagnoses:  ESRD (end stage renal disease) on dialysis [facility](HCC)    I spoke with Dr. Hyman HopesWebb of nephrology who will perform dialysis and then discharge    Azalia BilisKevin Louan Base, MD 09/01/15 [phone]

## 2015-09-01 NOTE — ED Notes (Signed)
A renal diet ordered for patient.

## 2015-09-01 NOTE — Procedures (Signed)
  I was present at this dialysis session, have reviewed the session itself and made  appropriate changes Vinson Moselleob Edelmiro Innocent MD [facility] pager [phone]    cell [phone] 09/01/2015, 4:11 PM

## 2015-09-01 NOTE — ED Notes (Signed)
Pt to ED by EMS requesting dialysis. Last received dialysis on Monday; c/o chronic hip pain.

## 2015-09-01 NOTE — ED Notes (Signed)
Pt calling out for pain medication; MD made aware

## 2015-09-01 NOTE — ED Notes (Addendum)
Dialysis called and said they would except pt for dialysis on second shift.

## 2015-09-02 ENCOUNTER — Encounter (HOSPITAL_COMMUNITY): Payer: Self-pay

## 2015-09-02 DIAGNOSIS — N186 End stage renal disease: Secondary | ICD-10-CM | POA: Diagnosis not present

## 2015-09-02 DIAGNOSIS — I252 Old myocardial infarction: Secondary | ICD-10-CM | POA: Diagnosis not present

## 2015-09-02 DIAGNOSIS — F17210 Nicotine dependence, cigarettes, uncomplicated: Secondary | ICD-10-CM | POA: Insufficient documentation

## 2015-09-02 DIAGNOSIS — R110 Nausea: Secondary | ICD-10-CM | POA: Insufficient documentation

## 2015-09-02 DIAGNOSIS — G629 Polyneuropathy, unspecified: Secondary | ICD-10-CM | POA: Diagnosis not present

## 2015-09-02 DIAGNOSIS — Z79899 Other long term (current) drug therapy: Secondary | ICD-10-CM | POA: Diagnosis not present

## 2015-09-02 DIAGNOSIS — Z8669 Personal history of other diseases of the nervous system and sense organs: Secondary | ICD-10-CM | POA: Insufficient documentation

## 2015-09-02 DIAGNOSIS — E119 Type 2 diabetes mellitus without complications: Secondary | ICD-10-CM | POA: Insufficient documentation

## 2015-09-02 DIAGNOSIS — R109 Unspecified abdominal pain: Secondary | ICD-10-CM | POA: Diagnosis present

## 2015-09-02 DIAGNOSIS — Z8709 Personal history of other diseases of the respiratory system: Secondary | ICD-10-CM | POA: Diagnosis not present

## 2015-09-02 DIAGNOSIS — I509 Heart failure, unspecified: Secondary | ICD-10-CM | POA: Diagnosis not present

## 2015-09-02 DIAGNOSIS — Z8719 Personal history of other diseases of the digestive system: Secondary | ICD-10-CM | POA: Insufficient documentation

## 2015-09-02 DIAGNOSIS — I120 Hypertensive chronic kidney disease with stage 5 chronic kidney disease or end stage renal disease: Secondary | ICD-10-CM | POA: Diagnosis not present

## 2015-09-02 LAB — CBC
HEMATOCRIT: 33.5 % — AB (ref 39.0–52.0)
HEMOGLOBIN: 10.8 g/dL — AB (ref 13.0–17.0)
MCH: 30 pg (ref 26.0–34.0)
MCHC: 32.2 g/dL (ref 30.0–36.0)
MCV: 93.1 fL (ref 78.0–100.0)
Platelets: 58 10*3/uL — ABNORMAL LOW (ref 150–400)
RBC: 3.6 MIL/uL — AB (ref 4.22–5.81)
RDW: 16 % — ABNORMAL HIGH (ref 11.5–15.5)
WBC: 6 10*3/uL (ref 4.0–10.5)

## 2015-09-02 LAB — LIPASE, BLOOD: LIPASE: 117 U/L — AB (ref 11–51)

## 2015-09-02 NOTE — ED Notes (Signed)
Pt here with c/o abdominal pain, nausea and diarrhea, blurry vision, but denies vomiting; symptoms started yesterday. He receives dialysis M/W/F, last dialyzed Wednesday.

## 2015-09-03 ENCOUNTER — Emergency Department (HOSPITAL_COMMUNITY)
Admission: EM | Admit: 2015-09-03 | Discharge: 2015-09-03 | Disposition: A | Discharge status: Home / Self Care | Payer: Medicare Other | Attending: Emergency Medicine | Admitting: Emergency Medicine

## 2015-09-03 DIAGNOSIS — R110 Nausea: Secondary | ICD-10-CM

## 2015-09-03 LAB — COMPREHENSIVE METABOLIC PANEL
ALBUMIN: 2.8 g/dL — AB (ref 3.5–5.0)
ALK PHOS: 124 U/L (ref 38–126)
ALT: 25 U/L (ref 17–63)
ANION GAP: 16 — AB (ref 5–15)
AST: 41 U/L (ref 15–41)
BUN: 50 mg/dL — ABNORMAL HIGH (ref 6–20)
CHLORIDE: 102 mmol/L (ref 101–111)
CO2: 22 mmol/L (ref 22–32)
Calcium: 6 mg/dL — CL (ref 8.9–10.3)
Creatinine, Ser: 8.84 mg/dL — ABNORMAL HIGH (ref 0.61–1.24)
GFR calc non Af Amer: 6 mL/min — ABNORMAL LOW (ref >60)
GFR, EST AFRICAN AMERICAN: 7 mL/min — AB (ref >60)
GLUCOSE: 83 mg/dL (ref 65–99)
POTASSIUM: 4.7 mmol/L (ref 3.5–5.1)
SODIUM: 140 mmol/L (ref 135–145)
Total Bilirubin: 0.5 mg/dL (ref 0.3–1.2)
Total Protein: 7 g/dL (ref 6.5–8.1)

## 2015-09-03 NOTE — ED Notes (Signed)
Pt in waiting room sound asleep.  Back in treatment room c/o abd pain asking for food

## 2015-09-03 NOTE — Discharge Instructions (Signed)
You were seen today for nausea. You have no indication at this time for dialysis. If you develop new or worsening symptoms she should be reevaluated immediately.

## 2015-09-03 NOTE — ED Provider Notes (Signed)
CSN: [id number]     Arrival date & time 09/02/15  2251 History  By signing my name below, I, Arianna Nassar, attest that this documentation has been prepared under the direction and in the presence of Shon Baton, MD. Electronically Signed: Octavia Heir, ED Scribe. 09/03/2015. 1:47 AM.     Chief Complaint  Patient presents with  . Abdominal Pain      The history is provided by the patient. No language interpreter was used.   HPI Comments: [name] is a 60 y.o. male who has a hx of MI, HTN, GERD, pulmonary edema, neuropathy, pancreatitis, CHF, DM, and ERSD presents to the Emergency Department complaining of sudden onset, gradual worsening, moderate nausea with associated diarrhea onset yesterday. Pt notes his last dialysis Wednesday and he did not have a full session due to having abdominal cramping. Denies abdominal pain at this time. States "I think I need dialysis." Denies fever, blood in stool, sick contacts, or fever.   Patient is homeless. Does not have a regular dialysis center. Currently he is requesting a sandwich.  Past Medical History  Diagnosis Date  . Myocardial infarction (HCC)   . Hypertension   . GERD (gastroesophageal reflux disease)   . Pulmonary edema     Just prior to starting HD and resolved with HD. ECHO in 2008 showed normal LVEF, mild diiastolic dysfunction.   . Neuropathy (HCC)     Hx: of in toes  . Hepatitis     Hc: of Hep C  . Cataracts, both eyes     Hx: of "slightly"  . Pancreatitis     Hx: of  . CHF (congestive heart failure) (HCC)   . Diabetes mellitus     not on medication  . 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  Started HD in 2007, gets HD at [facility] on MWF schedule.     Past Surgical History  Procedure Laterality Date  . Dialysis fistula creation Left   . Cholecystectomy    . Colonoscopy      Hx: of  . Ligation of arteriovenous  fistula Left 11/06/2012    Procedure: PLICATION OF  BRACHIO-CEPHALIC ARTERIOVENOUS  FISTULA;  Surgeon: Fransisco Hertz, MD;  Location: [facility] OR;  Service: Vascular;  Laterality: Left;  . Pars plana vitrectomy Right 11/10/2014    Procedure: PARS PLANA VITRECTOMY WITH 25 GAUGE;  Surgeon: Sherrie George, MD;  Location: [facility] OR;  Service: Ophthalmology;  Laterality: Right;  . Membrane peel Right 11/10/2014    Procedure: MEMBRANE PEEL;  Surgeon: Sherrie George, MD;  Location: [facility] OR;  Service: Ophthalmology;  Laterality: Right;  . Gas/fluid exchange Right 11/10/2014    Procedure: GAS/FLUID EXCHANGE;  Surgeon: Sherrie George, MD;  Location: [facility] OR;  Service: Ophthalmology;  Laterality: Right;  . Laser photo ablation Right 11/10/2014    Procedure: LASER PHOTO ABLATION;  Surgeon: Sherrie George, MD;  Location: [facility] OR;  Service: Ophthalmology;  Laterality: Right;  Endo Laser   Family History  Problem Relation Age of Onset  . Diabetes Mother    Social History  Substance Use Topics  . Smoking status: Current Every Day Smoker -- 0.35 packs/day for 43 years    Types: Cigarettes    Last Attempt to Quit: 12/16/2012  . Smokeless tobacco: Never Used  . Alcohol Use: No    Review of Systems  Constitutional: Negative for fever.  Respiratory: Negative for shortness of  breath.   Cardiovascular: Negative for chest pain and leg swelling.  Gastrointestinal: Positive for nausea and diarrhea. Negative for vomiting, abdominal pain and blood in stool.  All other systems reviewed and are negative.     Allergies  Eggs or egg-derived products  Home Medications   Prior to Admission medications   Medication Sig Start Date End Date Taking? Authorizing Provider  amLODipine (NORVASC) 10 MG tablet Take 10 mg by mouth daily.   Yes Historical Provider, MD  b complex-vitamin c-folic acid (NEPHRO-VITE) 0.8 MG TABS Take 0.8 mg by mouth at bedtime.   Yes Historical Provider, MD  carvedilol (COREG) 3.125 MG tablet Take 3.125 mg by mouth 2 (two) times daily. 01/08/15  Yes Historical  Provider, MD  FOSRENOL 1000 MG chewable tablet Chew 1,000 mg by mouth 5 (five) times daily. 01/06/15  Yes Historical Provider, MD  hydrALAZINE (APRESOLINE) 100 MG tablet Take 100 mg by mouth 2 (two) times daily.  11/05/14  Yes Historical Provider, MD  lisinopril (PRINIVIL,ZESTRIL) 40 MG tablet Take 40 mg by mouth 2 (two) times daily.    Yes Historical Provider, MD  PROAIR HFA 108 (90 BASE) MCG/ACT inhaler Inhale 1 puff into the lungs every 4 (four) hours as needed for wheezing. Wheezing 09/22/14  Yes Historical Provider, MD   Triage vitals: BP 155/83 mmHg  Pulse 70  Temp(Src) 98.5 F (36.9 C) (Oral)  Resp 16  SpO2 100% Physical Exam  Constitutional: He is oriented to person, place, and time. No distress.  Disheveled and chronically ill-appearing  HENT:  Head: Normocephalic and atraumatic.  Cardiovascular: Normal rate, regular rhythm and normal heart sounds.   Fistula positive thrill  Pulmonary/Chest: Effort normal and breath sounds normal. No respiratory distress. He has no wheezes. He has no rales.  Abdominal: Soft. Bowel sounds are normal. There is no tenderness. There is no rebound and no guarding.  Musculoskeletal: He exhibits no edema.  Trace bilateral lower extremity edema  Neurological: He is alert and oriented to person, place, and time.  Skin: Skin is warm and dry.  Psychiatric: He has a normal mood and affect.  Nursing note and vitals reviewed.   ED Course  Procedures  DIAGNOSTIC STUDIES: Oxygen Saturation is 100% on RA, normal by my interpretation.  COORDINATION OF CARE:  1:47 AM Discussed treatment plan with pt at bedside and pt agreed to plan.  Labs Review Labs Reviewed  LIPASE, BLOOD - Abnormal; Notable for the following:    Lipase 117 (*)    All other components within normal limits  COMPREHENSIVE METABOLIC PANEL - Abnormal; Notable for the following:    BUN 50 (*)    Creatinine, Ser 8.84 (*)    Calcium 6.0 (*)    Albumin 2.8 (*)    GFR calc non Af Amer 6  (*)    GFR calc Af Amer 7 (*)    Anion gap 16 (*)    All other components within normal limits  CBC - Abnormal; Notable for the following:    RBC 3.60 (*)    Hemoglobin 10.8 (*)    HCT 33.5 (*)    RDW 16.0 (*)    Platelets 58 (*)    All other components within normal limits  URINALYSIS, ROUTINE W REFLEX MICROSCOPIC (NOT AT [facility])    Imaging Review No results found. I have personally reviewed and evaluated these images and lab results as part of my medical decision-making.   EKG Interpretation   Date/Time:  Thursday September 02 2015 23:01:24  EDT Ventricular Rate:  70 PR Interval:  232 QRS Duration: 158 QT Interval:  512 QTC Calculation: 552 R Axis:   -60 Text Interpretation:  Sinus rhythm with 1st degree A-V block Left axis  deviation Right bundle branch block Anterior infarct , age undetermined  Abnormal ECG No significant change since last tracing Confirmed by Alicen Donalson   MD, Toni AmendOURTNEY ([id number]) on 09/03/2015 1:22:32 AM      MDM   Final diagnoses:  Nausea    She presents with nausea and diarrhea. Nontoxic on exam. Feels like he needs dialysis. Currently without complaints of abdominal pain. Did complain of abdominal pain in triage. Signs are reassuring. He is chronically ill-appearing. Otherwise physical exam is largely unremarkable. No respiratory distress or evidence of overt volume overload. Potassium and EKG are reassuring. Lipase is 117. Patient has a history of minimally elevated lipase in the past. Given that he is nontender, doubt this is the etiology of his symptoms. He is able to tolerate food without difficulty.  No emergent indication for dialysis at this time.  Will d/c.  After history, exam, and medical workup I feel the patient has been appropriately medically screened and is safe for discharge home. Pertinent diagnoses were discussed with the patient. Patient was given return precautions.   I personally performed the services described in this documentation, which  was scribed in my presence. The recorded information has been reviewed and is accurate.   Shon Batonourtney F Jenika Chiem, MD 09/03/15 [phone]

## 2015-09-03 NOTE — ED Notes (Signed)
Critical Ca+ reported to Dr Mora Bellmanni. No new orders received.

## 2015-09-05 ENCOUNTER — Observation Stay (HOSPITAL_COMMUNITY)
Admission: EM | Admit: 2015-09-05 | Discharge: 2015-09-05 | Disposition: A | Discharge status: Home / Self Care | Payer: Medicare Other | Attending: Nephrology | Admitting: Nephrology

## 2015-09-05 ENCOUNTER — Encounter (HOSPITAL_COMMUNITY): Payer: Self-pay | Admitting: Emergency Medicine

## 2015-09-05 DIAGNOSIS — Z590 Homelessness: Secondary | ICD-10-CM | POA: Insufficient documentation

## 2015-09-05 DIAGNOSIS — E1140 Type 2 diabetes mellitus with diabetic neuropathy, unspecified: Secondary | ICD-10-CM | POA: Insufficient documentation

## 2015-09-05 DIAGNOSIS — I2510 Atherosclerotic heart disease of native coronary artery without angina pectoris: Secondary | ICD-10-CM | POA: Insufficient documentation

## 2015-09-05 DIAGNOSIS — R600 Localized edema: Secondary | ICD-10-CM | POA: Diagnosis not present

## 2015-09-05 DIAGNOSIS — Z79899 Other long term (current) drug therapy: Secondary | ICD-10-CM | POA: Diagnosis not present

## 2015-09-05 DIAGNOSIS — E875 Hyperkalemia: Secondary | ICD-10-CM | POA: Diagnosis present

## 2015-09-05 DIAGNOSIS — Z992 Dependence on renal dialysis: Secondary | ICD-10-CM | POA: Diagnosis not present

## 2015-09-05 DIAGNOSIS — N19 Unspecified kidney failure: Secondary | ICD-10-CM | POA: Diagnosis present

## 2015-09-05 DIAGNOSIS — N186 End stage renal disease: Secondary | ICD-10-CM | POA: Diagnosis present

## 2015-09-05 DIAGNOSIS — I5042 Chronic combined systolic (congestive) and diastolic (congestive) heart failure: Secondary | ICD-10-CM | POA: Diagnosis not present

## 2015-09-05 DIAGNOSIS — E8770 Fluid overload, unspecified: Secondary | ICD-10-CM | POA: Diagnosis present

## 2015-09-05 DIAGNOSIS — I132 Hypertensive heart and chronic kidney disease with heart failure and with stage 5 chronic kidney disease, or end stage renal disease: Secondary | ICD-10-CM | POA: Insufficient documentation

## 2015-09-05 DIAGNOSIS — F17210 Nicotine dependence, cigarettes, uncomplicated: Secondary | ICD-10-CM | POA: Diagnosis not present

## 2015-09-05 DIAGNOSIS — E1122 Type 2 diabetes mellitus with diabetic chronic kidney disease: Secondary | ICD-10-CM | POA: Diagnosis not present

## 2015-09-05 DIAGNOSIS — B1920 Unspecified viral hepatitis C without hepatic coma: Secondary | ICD-10-CM | POA: Insufficient documentation

## 2015-09-05 DIAGNOSIS — R110 Nausea: Secondary | ICD-10-CM | POA: Diagnosis present

## 2015-09-05 DIAGNOSIS — I252 Old myocardial infarction: Secondary | ICD-10-CM | POA: Diagnosis not present

## 2015-09-05 DIAGNOSIS — I10 Essential (primary) hypertension: Secondary | ICD-10-CM | POA: Diagnosis present

## 2015-09-05 LAB — CBC WITH DIFFERENTIAL/PLATELET
BASOS ABS: 0.1 10*3/uL (ref 0.0–0.1)
Basophils Relative: 1 %
EOS ABS: 1.7 10*3/uL — AB (ref 0.0–0.7)
EOS PCT: 16 %
HCT: 37.2 % — ABNORMAL LOW (ref 39.0–52.0)
Hemoglobin: 11.8 g/dL — ABNORMAL LOW (ref 13.0–17.0)
Lymphocytes Relative: 15 %
Lymphs Abs: 1.5 10*3/uL (ref 0.7–4.0)
MCH: 29.1 pg (ref 26.0–34.0)
MCHC: 31.7 g/dL (ref 30.0–36.0)
MCV: 91.9 fL (ref 78.0–100.0)
Monocytes Absolute: 0.9 10*3/uL (ref 0.1–1.0)
Monocytes Relative: 8 %
Neutro Abs: 6.1 10*3/uL (ref 1.7–7.7)
Neutrophils Relative %: 60 %
PLATELETS: 109 10*3/uL — AB (ref 150–400)
RBC: 4.05 MIL/uL — AB (ref 4.22–5.81)
RDW: 15.8 % — AB (ref 11.5–15.5)
WBC: 10.2 10*3/uL (ref 4.0–10.5)

## 2015-09-05 LAB — BASIC METABOLIC PANEL
Anion gap: 22 — ABNORMAL HIGH (ref 5–15)
BUN: 81 mg/dL — AB (ref 6–20)
CO2: 15 mmol/L — ABNORMAL LOW (ref 22–32)
CREATININE: 13.2 mg/dL — AB (ref 0.61–1.24)
Calcium: 6.1 mg/dL — CL (ref 8.9–10.3)
Chloride: 100 mmol/L — ABNORMAL LOW (ref 101–111)
GFR calc Af Amer: 4 mL/min — ABNORMAL LOW (ref >60)
GFR, EST NON AFRICAN AMERICAN: 4 mL/min — AB (ref >60)
Glucose, Bld: 64 mg/dL — ABNORMAL LOW (ref 65–99)
Potassium: 5.6 mmol/L — ABNORMAL HIGH (ref 3.5–5.1)
SODIUM: 137 mmol/L (ref 135–145)

## 2015-09-05 MED ORDER — HEPARIN SODIUM (PORCINE) 1000 UNIT/ML DIALYSIS
1000.0000 [IU] | INTRAMUSCULAR | Status: DC | PRN
  Filled 2015-09-05: qty 1

## 2015-09-05 MED ORDER — ACETAMINOPHEN 650 MG RE SUPP: 650.0000 mg | Freq: Four times a day (QID) | RECTAL | Status: DC | PRN

## 2015-09-05 MED ORDER — SODIUM CHLORIDE 0.9 % IV SOLN: 100.0000 mL | INTRAVENOUS | Status: DC | PRN

## 2015-09-05 MED ORDER — ALTEPLASE 2 MG IJ SOLR: 2.0000 mg | Freq: Once | INTRAMUSCULAR | Status: DC | PRN

## 2015-09-05 MED ORDER — ONDANSETRON HCL 4 MG PO TABS: 4.0000 mg | ORAL_TABLET | Freq: Four times a day (QID) | ORAL | Status: DC | PRN

## 2015-09-05 MED ORDER — HEPARIN SODIUM (PORCINE) 1000 UNIT/ML DIALYSIS
2000.0000 [IU] | INTRAMUSCULAR | Status: DC | PRN
  Filled 2015-09-05: qty 2

## 2015-09-05 MED ORDER — PENTAFLUOROPROP-TETRAFLUOROETH EX AERO: 1.0000 "application " | INHALATION_SPRAY | CUTANEOUS | Status: DC | PRN

## 2015-09-05 MED ORDER — LIDOCAINE-PRILOCAINE 2.5-2.5 % EX CREA
1.0000 "application " | TOPICAL_CREAM | CUTANEOUS | Status: DC | PRN
  Filled 2015-09-05: qty 5

## 2015-09-05 MED ORDER — ACETAMINOPHEN 325 MG PO TABS: 650.0000 mg | ORAL_TABLET | Freq: Four times a day (QID) | ORAL | Status: DC | PRN

## 2015-09-05 MED ORDER — LIDOCAINE HCL (PF) 1 % IJ SOLN
5.0000 mL | INTRAMUSCULAR | Status: DC | PRN
  Filled 2015-09-05: qty 5

## 2015-09-05 MED ORDER — ONDANSETRON HCL 4 MG/2ML IJ SOLN: 4.0000 mg | Freq: Four times a day (QID) | INTRAMUSCULAR | Status: DC | PRN

## 2015-09-05 NOTE — H&P (Signed)
[facility] History and Physical  Jerome BucksMichael A Branch ZOX:[id number] DOB: 11/13/1955 DOA: 09/05/2015  Referring physician: Dr Criss AlvineGoldston ED PCP: Rinaldo CloudHarwani, Mohan, MD   Chief Complaint: nausea, leg swelling, need for dialysis  HPI: Jerome BucksMichael A Branch is a 60 y.o. male with hx of ESRD '08, DM, HTN, prior substance abuse/ cirrhosis/ pancreatitis, now homeless and recently discharged from his outpatient dialysis due to behavior problems.  Presents to ED requesting dialysis. K 5.6.  Reports nausea, fatigue and painful swollen legs main issues. Denies fevers, chills, CP or SOB    Chart review: Depression/ hx crack cocaine/ etoh abuse/ cirrhosis/ depression / SI/ DM2/ CAD/ HTN / mult suicide attempts/ ansarca/ CHFsyst-diast/ esrd start HD '08 / pericarditis '09/ heart cath '09 nonobst dz/ CDif 2011/ chron panc/ acute panc 2014  ROS  denies CP  no joint pain   no HA  no blurry vision  no rash  no diarrhea  no nausea/ vomiting  no dysuria  no difficulty voiding  no change in urine color    Where does patient live homeless Can patient participate in ADLs  Past Medical History  Past Medical History  Diagnosis Date  . Myocardial infarction (HCC)   . Hypertension   . GERD (gastroesophageal reflux disease)   . Pulmonary edema     Just prior to starting HD and resolved with HD. ECHO in 2008 showed normal LVEF, mild diiastolic dysfunction.   . Neuropathy (HCC)     Hx: of in toes  . Hepatitis     Hc: of Hep C  . Cataracts, both eyes     Hx: of "slightly"  . Pancreatitis     Hx: of  . CHF (congestive heart failure) (HCC)   . Diabetes mellitus     not on medication  . 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  Started HD in 2007, [facility] M[facility].     Past Surgical History  Past Surgical History  Procedure Laterality Date  . Dialysis fistula creation Left   . Cholecystectomy    . Colonoscopy      Hx: of  . Ligation of  arteriovenous  fistula Left 11/06/2012    Procedure: PLICATION OF BRACHIO-CEPHALIC ARTERIOVENOUS  FISTULA;  Surgeon: Fransisco HertzBrian L Chen, MD;  [facility];  Service: Vascular;  Laterality: Left;  . Pars plana vitrectomy Right 11/10/2014    Procedure: PARS PLANA VITRECTOMY WITH 25 GAUGE;  Surgeon: Sherrie GeorgeJohn D Matthews, MD;  [facility];  Service: Ophthalmology;  Laterality: Right;  . Membrane peel Right 11/10/2014    Procedure: MEMBRANE PEEL;  Surgeon: Sherrie GeorgeJohn D Matthews, MD;  [facility];  Service: Ophthalmology;  Laterality: Right;  . Gas/fluid exchange Right 11/10/2014    Procedure: GAS/FLUID EXCHANGE;  Surgeon: Sherrie GeorgeJohn D Matthews, MD;  [facility];  Service: Ophthalmology;  Laterality: Right;  . Laser photo ablation Right 11/10/2014    Procedure: LASER PHOTO ABLATION;  Surgeon: Sherrie GeorgeJohn D Matthews, MD;  [facility];  Service: Ophthalmology;  Laterality: Right;  Endo Laser   Family History  Family History  Problem Relation Age of Onset  . Diabetes Mother    Social History  reports that he has been smoking Cigarettes.  He has a 15.05 pack-year smoking history. He has never used smokeless tobacco. He reports that he uses illicit drugs (Cocaine and Marijuana). He reports that he does not drink alcohol. Allergies  Allergies  Allergen  Reactions  . Eggs Or Egg-Derived Products Other (See Comments)    "Cant Digest" " Yolks"   Home medications Prior to Admission medications   Medication Sig Start Date End Date Taking? Authorizing Provider  amLODipine (NORVASC) 10 MG tablet Take 10 mg by mouth daily.   Yes Historical Provider, MD  b complex-vitamin c-folic acid (NEPHRO-VITE) 0.8 MG TABS Take 0.8 mg by mouth at bedtime.   Yes Historical Provider, MD  carvedilol (COREG) 3.125 MG tablet Take 3.125 mg by mouth 2 (two) times daily. 01/08/15  Yes Historical Provider, MD  FOSRENOL 1000 MG chewable tablet Chew 1,000 mg by mouth 5 (five) times daily. 01/06/15  Yes Historical Provider, MD  hydrALAZINE  (APRESOLINE) 100 MG tablet Take 100 mg by mouth 2 (two) times daily.  11/05/14  Yes Historical Provider, MD  lisinopril (PRINIVIL,ZESTRIL) 40 MG tablet Take 40 mg by mouth 2 (two) times daily.    Yes Historical Provider, MD  PROAIR HFA 108 (90 BASE) MCG/ACT inhaler Inhale 1 puff into the lungs every 4 (four) hours as needed for wheezing. Wheezing 09/22/14  Yes Historical Provider, MD   Liver Function Tests  Recent Labs Lab 09/02/15 2313  AST 41  ALT 25  ALKPHOS 124  BILITOT 0.5  PROT 7.0  ALBUMIN 2.8*    Recent Labs Lab 09/02/15 2313  LIPASE 117*   CBC  Recent Labs Lab 09/01/15 0403 09/02/15 2313 09/05/15 0623  WBC 7.8 6.0 10.2  NEUTROABS  --   --  6.1  HGB 11.2* 10.8* 11.8*  HCT 33.9* 33.5* 37.2*  MCV 93.1 93.1 91.9  PLT 65* 58* 109*   Basic Metabolic Panel  Recent Labs Lab 08/30/15 1212 09/01/15 0403 09/02/15 2313 09/05/15 0623  NA 136 139 140 137  K 5.8* 4.7 4.7 5.6*  CL 107 103 102 100*  CO2  --  20* 22 15*  GLUCOSE 175* 78 83 64*  BUN 107* 62* 50* 81*  CREATININE 15.10* 10.72* 8.84* 13.20*  CALCIUM  --  6.1* 6.0* 6.1*     Filed Vitals:   09/05/15 0510 09/05/15 0546 09/05/15 0730  BP: 168/88  111/90  Pulse: 92  70  Temp: 97.4 F (36.3 C)    TempSrc: Oral    Resp: 16    SpO2: 99% 98% 99%   Exam: Gen alert, no distress, calm No rash, cyanosis or gangrene Sclera anicteric, throat clear  No jvd or bruits Chest clear bilat RRR no MRG Abd soft ntnd no mass or ascites +bs GU normal male MS no joint effusions or deformity Ext 1-2+ bilat LE edema / no wounds or ulcers Neuro is alert, Ox 3    Assessment: 1 Uremia 2 Mild hyperkalemia 3 ESRD no outpatient unit 4 HTN 5 DM on insulin 5 prior subst abuse  Plan - HD today, dc home after HD   DVT Prophylaxis none  Code Status: full  Family Communication: none  Disposition Plan: home after HD    Maree Krabbe [facility] Pager [phone]  Cell [phone]  If 7PM-7AM,  please contact night-coverage [email or web address] Password [email or web address] 09/05/2015, 10:40 AM

## 2015-09-05 NOTE — ED Notes (Signed)
PT incontinent of stool; linens changed; fresh scrubs given; and patient instructed to pack up his room for impending dialysis.

## 2015-09-05 NOTE — ED Notes (Addendum)
Pt to be transported to 5W10 for dialysis by dialysis RN in the room due to dialysis floor being cleaned. PT to be discharged after dialysis per nephrologist.

## 2015-09-05 NOTE — ED Notes (Signed)
CRITICAL VALUE ALERT  Critical value received:  Calcium 6.1  Date of notification:  09/05/2015  Time of notification:  0722  Critical value read back: yes  Nurse who received alert:  Fredirick MaudlinHolley Kingslee Mairena RN   MD notified:MD Criss AlvineGoldston

## 2015-09-05 NOTE — ED Notes (Addendum)
Cannot give report to floor nurse per 5W charge RN. Attempting to get in touch with dialysis RN. Spoke with dialysis RN Dain,RN who is getting equipment set up now; pt updated on plan. [place] sandwich given. Belongings sent with patient.

## 2015-09-05 NOTE — Care Management Note (Addendum)
Case Management Note  Patient Details  Name: [name] MRN: [id number] Date of Birth: 08/28/1955  Subjective/Objective:  CM received call from EDP for this 60 yo homeless M who is being seen today for Renal issues. MD reports he is non-emergent HD today yet if he is released, liklihood of return with hyperkalemia by tomorrow is high. To be seen by Nephrology and plan established. CM will give resources for homelessness in absence of CSW.  Action/Plan: CM will continue to be available for additional disposition discharge needs. Will also refer to CSW.   Expected Discharge Date:                  Expected Discharge Plan:     In-House Referral:  Clinical Social Work  Discharge planning Services  CM Consult  Post Acute Care Choice:    Choice offered to:  Patient  DME Arranged:    DME Agency:     HH Arranged:    HH Agency:     Status of Service:  In process, will continue to follow  Medicare Important Message Given:    Date Medicare IM Given:    Medicare IM give by:    Date Additional Medicare IM Given:    Additional Medicare Important Message give by:     If discussed at Long Length of Stay Meetings, dates discussed:    Additional Comments: pt asleep and did not respond to CM when arrived to discuss Homelessness and resources available in the community for post discharge. Left [facility] numbers and available free meals locally at the bedside and made the RN aware of same. No further CM needs at this time.   Yvone NeuCrutchfield, Deyna Carbon M, RN 09/05/2015, 8:39 AM

## 2015-09-05 NOTE — ED Notes (Signed)
Patient ambulated to restroom without distress

## 2015-09-05 NOTE — ED Notes (Signed)
Patient is resting comfortably. 

## 2015-09-05 NOTE — Progress Notes (Signed)
Pt completed tx without complications; [place] [employer] RN on a wheelchair. Pt denies n/v; SOB; dizziness.

## 2015-09-05 NOTE — ED Notes (Signed)
Pt eating breakfast; refuses to be on cardiac monitor.

## 2015-09-05 NOTE — ED Notes (Signed)
Pt. arrived with EMS from street requesting hemodialysis , last dialysis Wednesday this week , denies SOB , no pain or discomfort .

## 2015-09-05 NOTE — Procedures (Signed)
Mr [name] is here asking for dialysis. Was discharge/ released from his OP HD unit about 1 month ago due to behavioral problems.  Last HD here was on Wed.  Main c/o's are nausea, swollen legs, fatigue. He is homeless.  Has RUE AVF. Exam shows mild JVD, clear lungs, abd benign, legs 2+ edema. AVF +bruit, alert, no asterixis. K 5.6.  Ca low 6.7. Plan 3.5 h HD today, to return approx 3 x per week to the ED for HD until he can reestablish care in an OP HD unit.      I was present at this dialysis session, have reviewed the session itself and made  appropriate changes Vinson Moselleob Allisha Harter MD [facility] pager [phone]    cell [phone] 09/05/2015, 10:24 AM

## 2015-09-05 NOTE — ED Notes (Signed)
Resources left at bedside by case management.

## 2015-09-05 NOTE — ED Notes (Signed)
Paper scrubs , socks given to pt.

## 2015-09-05 NOTE — ED Provider Notes (Signed)
CSN: [id number]     Arrival date & time 09/05/15  0501 History   First MD Initiated Contact with Patient 09/05/15 [phone]     No chief complaint on file.    (Consider location/radiation/quality/duration/timing/severity/associated sxs/prior Treatment) HPI  Blood pressure 168/88, pulse 92, temperature 97.4 F (36.3 C), temperature source Oral, resp. rate 16, SpO2 98 %.  [name] is a 60 y.o. male with past medical history significant for ACS, hepatitis, CHF, ESRD, diabetes presenting today requesting breakfast and dialysis. CT was last dialyzed on Wednesday in the ED, states he was discharged from his dialysis center on [address]. Patient denies chest pain, shortness of breath, nausea, vomiting. Endorses increasing peripheral edema without orthopnea or PND.  Past Medical History  Diagnosis Date  . Myocardial infarction (HCC)   . Hypertension   . GERD (gastroesophageal reflux disease)   . Pulmonary edema     Just prior to starting HD and resolved with HD. ECHO in 2008 showed normal LVEF, mild diiastolic dysfunction.   . Neuropathy (HCC)     Hx: of in toes  . Hepatitis     Hc: of Hep C  . Cataracts, both eyes     Hx: of "slightly"  . Pancreatitis     Hx: of  . CHF (congestive heart failure) (HCC)   . Diabetes mellitus     not on medication  . 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  Started HD in 2007, gets HD at [facility] on MWF schedule.     Past Surgical History  Procedure Laterality Date  . Dialysis fistula creation Left   . Cholecystectomy    . Colonoscopy      Hx: of  . Ligation of arteriovenous  fistula Left 11/06/2012    Procedure: PLICATION OF BRACHIO-CEPHALIC ARTERIOVENOUS  FISTULA;  Surgeon: Fransisco Hertz, MD;  Location: [facility] OR;  Service: Vascular;  Laterality: Left;  . Pars plana vitrectomy Right 11/10/2014    Procedure: PARS PLANA VITRECTOMY WITH 25 GAUGE;  Surgeon: Sherrie George, MD;  Location: [facility] OR;  Service:  Ophthalmology;  Laterality: Right;  . Membrane peel Right 11/10/2014    Procedure: MEMBRANE PEEL;  Surgeon: Sherrie George, MD;  Location[facility] OR;  Service: Ophthalmology;  Laterality: Right;  . Gas/fluid exchange Right 11/10/2014    Procedure: GAS/FLUID EXCHANGE;  Surgeon: Sherrie George, MD;  Location: [facility] OR;  Service: Ophthalmology;  Laterality: Right;  . Laser photo ablation Right 11/10/2014    Procedure: LASER PHOTO ABLATION;  Surgeon: Sherrie George, MD;  Location: [facility] OR;  Service: Ophthalmology;  Laterality: Right;  Endo Laser   Family History  Problem Relation Age of Onset  . Diabetes Mother    Social History  Substance Use Topics  . Smoking status: Current Every Day Smoker -- 0.35 packs/day for 43 years    Types: Cigarettes    Last Attempt to Quit: 12/16/2012  . Smokeless tobacco: Never Used  . Alcohol Use: No    Review of Systems  10 systems reviewed and found to be negative, except as noted in the HPI.   Allergies  Eggs or egg-derived products  Home Medications   Prior to Admission medications   Medication Sig Start Date End Date Taking? Authorizing Provider  amLODipine (NORVASC) 10 MG tablet Take 10 mg by mouth daily.   Yes Historical Provider, MD  b complex-vitamin c-folic acid (NEPHRO-VITE) 0.8 MG TABS Take 0.8 mg  by mouth at bedtime.   Yes Historical Provider, MD  carvedilol (COREG) 3.125 MG tablet Take 3.125 mg by mouth 2 (two) times daily. 01/08/15  Yes Historical Provider, MD  FOSRENOL 1000 MG chewable tablet Chew 1,000 mg by mouth 5 (five) times daily. 01/06/15  Yes Historical Provider, MD  hydrALAZINE (APRESOLINE) 100 MG tablet Take 100 mg by mouth 2 (two) times daily.  11/05/14  Yes Historical Provider, MD  lisinopril (PRINIVIL,ZESTRIL) 40 MG tablet Take 40 mg by mouth 2 (two) times daily.    Yes Historical Provider, MD  PROAIR HFA 108 (90 BASE) MCG/ACT inhaler Inhale 1 puff into the lungs every 4 (four) hours as needed for wheezing. Wheezing 09/22/14  Yes  Historical Provider, MD   BP 111/90 mmHg  Pulse 70  Temp(Src) 97.4 F (36.3 C) (Oral)  Resp 16  SpO2 99% Physical Exam  Constitutional: He is oriented to person, place, and time. He appears well-developed and well-nourished. No distress.  HENT:  Head: Normocephalic and atraumatic.  Mouth/Throat: Oropharynx is clear and moist.  Eyes: Conjunctivae and EOM are normal. Pupils are equal, round, and reactive to light.  Neck: Normal range of motion.  Cardiovascular: Normal rate, regular rhythm and intact distal pulses.   Fistula with good thrill  Pulmonary/Chest: Effort normal and breath sounds normal. No stridor. No respiratory distress. He has no wheezes. He has no rales. He exhibits no tenderness.  Abdominal: Soft. He exhibits no distension and no mass. There is no tenderness. There is no rebound and no guarding.  Musculoskeletal: Normal range of motion. He exhibits edema.  2+ pitting edema to midshin bilaterally  Neurological: He is alert and oriented to person, place, and time.  Psychiatric: He has a normal mood and affect.  Nursing note and vitals reviewed.   ED Course  Procedures (including critical care time) Labs Review Labs Reviewed  BASIC METABOLIC PANEL - Abnormal; Notable for the following:    Potassium 5.6 (*)    Chloride 100 (*)    CO2 15 (*)    Glucose, Bld 64 (*)    BUN 81 (*)    Creatinine, Ser 13.20 (*)    Calcium 6.1 (*)    GFR calc non Af Amer 4 (*)    GFR calc Af Amer 4 (*)    Anion gap 22 (*)    All other components within normal limits  CBC WITH DIFFERENTIAL/PLATELET - Abnormal; Notable for the following:    RBC 4.05 (*)    Hemoglobin 11.8 (*)    HCT 37.2 (*)    RDW 15.8 (*)    Platelets 109 (*)    Eosinophils Absolute 1.7 (*)    All other components within normal limits    Imaging Review No results found. I have personally reviewed and evaluated these images and lab results as part of my medical decision-making.   EKG  Interpretation   Date/Time:  Sunday September 05 2015 07:40:48 EDT Ventricular Rate:  72 PR Interval:  229 QRS Duration: 165 QT Interval:  513 QTC Calculation: 561 R Axis:   -39 Text Interpretation:  Sinus or ectopic atrial rhythm Prolonged PR interval  IVCD, consider atypical RBBB Abnormal T, consider ischemia, lateral leads  no significant change since September 02 2015 Confirmed by Criss AlvineGOLDSTON  MD, SCOTT  [phone]([id number]) on 09/05/2015 7:44:53 AM Also confirmed by Criss AlvineGOLDSTON  MD, SCOTT  ([id number]), editor [place], Cala BradfordKIMBERLY [phone]([id number])  on 09/05/2015 10:25:52 AM      MDM   Final diagnoses:  None  Filed Vitals:   09/05/15 0510 09/05/15 0546 09/05/15 0730  BP: 168/88  111/90  Pulse: 92  70  Temp: 97.4 F (36.3 C)    TempSrc: Oral    Resp: 16    SpO2: 99% 98% 99%    [name] is 60 y.o. male presenting with request for dialysis, last dialyzed 4 days ago. States he was discharged from his outpatient dialysis center. Positive lower extremity peripheral edema with clear lung sounds, will check basic blood work and discuss with nephrology.   Potassium and is 5.6, calcium 6.1. Discussed with Dr. Margarito Courser who will come to evaluate the patient.  Patient will be dialyzed today.    Wynetta Emery, PA-C 09/05/15 1052  Dione Booze, MD 09/05/15 [phone]

## 2015-09-05 NOTE — ED Notes (Signed)
Attempted report 

## 2015-09-06 ENCOUNTER — Encounter (HOSPITAL_COMMUNITY): Payer: Self-pay

## 2015-09-06 ENCOUNTER — Emergency Department (HOSPITAL_COMMUNITY)
Admission: EM | Admit: 2015-09-06 | Discharge: 2015-09-06 | Disposition: A | Discharge status: Home / Self Care | Payer: Medicare Other | Attending: Emergency Medicine | Admitting: Emergency Medicine

## 2015-09-06 DIAGNOSIS — N186 End stage renal disease: Secondary | ICD-10-CM | POA: Insufficient documentation

## 2015-09-06 DIAGNOSIS — Z79899 Other long term (current) drug therapy: Secondary | ICD-10-CM | POA: Diagnosis not present

## 2015-09-06 DIAGNOSIS — Z992 Dependence on renal dialysis: Secondary | ICD-10-CM | POA: Insufficient documentation

## 2015-09-06 DIAGNOSIS — I120 Hypertensive chronic kidney disease with stage 5 chronic kidney disease or end stage renal disease: Secondary | ICD-10-CM | POA: Insufficient documentation

## 2015-09-06 DIAGNOSIS — E119 Type 2 diabetes mellitus without complications: Secondary | ICD-10-CM | POA: Insufficient documentation

## 2015-09-06 DIAGNOSIS — I252 Old myocardial infarction: Secondary | ICD-10-CM | POA: Insufficient documentation

## 2015-09-06 DIAGNOSIS — F17210 Nicotine dependence, cigarettes, uncomplicated: Secondary | ICD-10-CM | POA: Diagnosis not present

## 2015-09-06 DIAGNOSIS — K219 Gastro-esophageal reflux disease without esophagitis: Secondary | ICD-10-CM | POA: Insufficient documentation

## 2015-09-06 DIAGNOSIS — Z8619 Personal history of other infectious and parasitic diseases: Secondary | ICD-10-CM | POA: Diagnosis not present

## 2015-09-06 DIAGNOSIS — I509 Heart failure, unspecified: Secondary | ICD-10-CM | POA: Insufficient documentation

## 2015-09-06 LAB — BASIC METABOLIC PANEL
Anion gap: 15 (ref 5–15)
BUN: 46 mg/dL — ABNORMAL HIGH (ref 6–20)
CO2: 25 mmol/L (ref 22–32)
Calcium: 6.3 mg/dL — CL (ref 8.9–10.3)
Chloride: 102 mmol/L (ref 101–111)
Creatinine, Ser: 9.02 mg/dL — ABNORMAL HIGH (ref 0.61–1.24)
GFR calc Af Amer: 7 mL/min — ABNORMAL LOW (ref >60)
GFR calc non Af Amer: 6 mL/min — ABNORMAL LOW (ref >60)
Glucose, Bld: 125 mg/dL — ABNORMAL HIGH (ref 65–99)
Potassium: 4.5 mmol/L (ref 3.5–5.1)
Sodium: 142 mmol/L (ref 135–145)

## 2015-09-06 NOTE — ED Notes (Signed)
Pt ambulatory w/ steady gait, states he is leaving now. Pt requesting food voucher. Explained that we do not have vouchers for food. Pt has no further questions concerns. Will make EDP aware that pt is leaving.

## 2015-09-06 NOTE — ED Notes (Signed)
Pt is here for dialysis. Ambulatory in room, states he just took his home meds. Pt has no other complaints at this time.

## 2015-09-06 NOTE — ED Provider Notes (Signed)
CSN: [id number]     Arrival date & time 09/06/15  0700 History   First MD Initiated Contact with Patient 09/06/15 [id number]     Chief Complaint  Patient presents with  . Vascular Access Problem     (Consider location/radiation/quality/duration/timing/severity/associated sxs/prior Treatment) HPI   59yM with ESRD presenting for dialysis. Apparently dismissed from his outpatient center for behavior issues. In ED yesterday and had dialysis. He says he was told to come back again today. He denies any acute complaints. No dyspnea. No significant edema currently.   Past Medical History  Diagnosis Date  . Myocardial infarction (HCC)   . Hypertension   . GERD (gastroesophageal reflux disease)   . Pulmonary edema     Just prior to starting HD and resolved with HD. ECHO in 2008 showed normal LVEF, mild diiastolic dysfunction.   . Neuropathy (HCC)     Hx: of in toes  . Hepatitis     Hc: of Hep C  . Cataracts, both eyes     Hx: of "slightly"  . Pancreatitis     Hx: of  . CHF (congestive heart failure) (HCC)   . Diabetes mellitus     not on medication  . 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  Started HD in 2007, [facility] MWF schedule.     Past Surgical History  Procedure Laterality Date  . Dialysis fistula creation Left   . Cholecystectomy    . Colonoscopy      Hx: of  . Ligation of arteriovenous  fistula Left 11/06/2012    Procedure: PLICATION OF BRACHIO-CEPHALIC ARTERIOVENOUS  FISTULA;  Surgeon: Fransisco HertzBrian L Chen, MD;  [facility];  Service: Vascular;  Laterality: Left;  . Pars plana vitrectomy Right 11/10/2014    Procedure: PARS PLANA VITRECTOMY WITH 25 GAUGE;  Surgeon: Sherrie GeorgeJohn D Matthews, MD;  [facility];  Service: Ophthalmology;  Laterality: Right;  . Membrane peel Right 11/10/2014    Procedure: MEMBRANE PEEL;  Surgeon: Sherrie GeorgeJohn D Matthews, MD;  [facility];  Service: Ophthalmology;  Laterality: Right;  . Gas/fluid exchange Right  11/10/2014    Procedure: GAS/FLUID EXCHANGE;  Surgeon: Sherrie GeorgeJohn D Matthews, MD;  [facility];  Service: Ophthalmology;  Laterality: Right;  . Laser photo ablation Right 11/10/2014    Procedure: LASER PHOTO ABLATION;  Surgeon: Sherrie GeorgeJohn D Matthews, MD;  [facility];  Service: Ophthalmology;  Laterality: Right;  Endo Laser   Family History  Problem Relation Age of Onset  . Diabetes Mother    Social History  Substance Use Topics  . Smoking status: Current Every Day Smoker -- 0.35 packs/day for 43 years    Types: Cigarettes    Last Attempt to Quit: 12/16/2012  . Smokeless tobacco: Never Used  . Alcohol Use: No    Review of Systems  All systems reviewed and negative, other than as noted in HPI.   Allergies  Eggs or egg-derived products  Home Medications   Prior to Admission medications   Medication Sig Start Date End Date Taking? Authorizing Provider  amLODipine (NORVASC) 10 MG tablet Take 10 mg by mouth daily.   Yes Historical Provider, MD  b complex-vitamin c-folic acid (NEPHRO-VITE) 0.8 MG TABS Take 0.8 mg by mouth at bedtime.   Yes Historical Provider, MD  carvedilol (COREG) 3.125 MG tablet Take 3.125 mg by mouth 2 (two) times daily. 01/08/15  Yes Historical Provider, MD  FOSRENOL 1000  MG chewable tablet Chew 1,000 mg by mouth 5 (five) times daily. 01/06/15  Yes Historical Provider, MD  hydrALAZINE (APRESOLINE) 100 MG tablet Take 100 mg by mouth 2 (two) times daily.  11/05/14  Yes Historical Provider, MD  lisinopril (PRINIVIL,ZESTRIL) 40 MG tablet Take 40 mg by mouth 2 (two) times daily.    Yes Historical Provider, MD  PROAIR HFA 108 (90 BASE) MCG/ACT inhaler Inhale 1 puff into the lungs every 4 (four) hours as needed for wheezing. Wheezing 09/22/14  Yes Historical Provider, MD   BP 158/136 mmHg  Pulse 67  Temp(Src) 98.5 F (36.9 C) (Oral)  Resp 18  SpO2 97% Physical Exam  Constitutional: He appears well-developed and well-nourished. No distress.  Sitting up in bed eating a  biscuit and drinking coffee. NAD.   HENT:  Head: Normocephalic and atraumatic.  Eyes: Conjunctivae are normal. Right eye exhibits no discharge. Left eye exhibits no discharge.  Neck: Neck supple.  Cardiovascular: Normal rate, regular rhythm and normal heart sounds.  Exam reveals no gallop and no friction rub.   No murmur heard. AV fistula RUE with good thrill  Pulmonary/Chest: Effort normal and breath sounds normal. No respiratory distress.  Abdominal: Soft. He exhibits no distension. There is no tenderness.  Musculoskeletal: He exhibits no edema or tenderness.  Neurological: He is alert.  Skin: Skin is warm and dry.  Psychiatric: He has a normal mood and affect. His behavior is normal. Thought content normal.  Nursing note and vitals reviewed.   ED Course  Procedures (including critical care time) Labs Review Labs Reviewed  BASIC METABOLIC PANEL - Abnormal; Notable for the following:    Glucose, Bld 125 (*)    BUN 46 (*)    Creatinine, Ser 9.02 (*)    Calcium 6.3 (*)    GFR calc non Af Amer 6 (*)    GFR calc Af Amer 7 (*)    All other components within normal limits    Imaging Review No results found. I have personally reviewed and evaluated these images and lab results as part of my medical decision-making.   EKG Interpretation None      MDM   Final diagnoses:  ESRD (end stage renal disease) [facility])    Mr Leather came to the ED today requesting dialysis.  Nephrology documentation stating that he is to come to ED 3x/week for dialysis until he can become reestablished as an outpatient. I do not see specific mention that he should return for another session today. Last dialysis was yesterday and next previous session was last Wednesday.He has no acute complaints. Clinically he does not appear significantly volume overloaded. Will check labs and discuss with nephrology. At this time, I do not think he necessarily needs dilaysis.   Labs ok in light of renal failure.  Discussed briefly with Dr Eliott Nine. She wasn't given specific information that he should return today and agrees there is no emergent reason for it currently. Pt discharged in NAD. It has been determined that no acute conditions requiring further emergency intervention are present at this time. The patient has been advised of the diagnosis and plan. I reviewed any labs and imaging including any potential incidental findings. We have discussed signs and symptoms that warrant return to the ED and they are listed in the discharge instructions.      Raeford Razor, MD 09/07/15 [phone]

## 2015-09-09 ENCOUNTER — Encounter (HOSPITAL_COMMUNITY): Payer: Self-pay | Admitting: Emergency Medicine

## 2015-09-09 ENCOUNTER — Emergency Department (HOSPITAL_COMMUNITY)
Admission: EM | Admit: 2015-09-09 | Discharge: 2015-09-09 | Disposition: A | Discharge status: Home / Self Care | Payer: Medicare Other | Attending: Emergency Medicine | Admitting: Emergency Medicine

## 2015-09-09 DIAGNOSIS — E872 Acidosis, unspecified: Secondary | ICD-10-CM

## 2015-09-09 DIAGNOSIS — I120 Hypertensive chronic kidney disease with stage 5 chronic kidney disease or end stage renal disease: Secondary | ICD-10-CM | POA: Diagnosis present

## 2015-09-09 DIAGNOSIS — I509 Heart failure, unspecified: Secondary | ICD-10-CM | POA: Diagnosis not present

## 2015-09-09 DIAGNOSIS — F17210 Nicotine dependence, cigarettes, uncomplicated: Secondary | ICD-10-CM | POA: Insufficient documentation

## 2015-09-09 DIAGNOSIS — N186 End stage renal disease: Secondary | ICD-10-CM | POA: Insufficient documentation

## 2015-09-09 DIAGNOSIS — E119 Type 2 diabetes mellitus without complications: Secondary | ICD-10-CM | POA: Insufficient documentation

## 2015-09-09 DIAGNOSIS — Z8619 Personal history of other infectious and parasitic diseases: Secondary | ICD-10-CM | POA: Diagnosis not present

## 2015-09-09 DIAGNOSIS — Z79899 Other long term (current) drug therapy: Secondary | ICD-10-CM | POA: Insufficient documentation

## 2015-09-09 DIAGNOSIS — I252 Old myocardial infarction: Secondary | ICD-10-CM | POA: Diagnosis not present

## 2015-09-09 DIAGNOSIS — E875 Hyperkalemia: Secondary | ICD-10-CM

## 2015-09-09 DIAGNOSIS — Z992 Dependence on renal dialysis: Secondary | ICD-10-CM | POA: Insufficient documentation

## 2015-09-09 DIAGNOSIS — K219 Gastro-esophageal reflux disease without esophagitis: Secondary | ICD-10-CM | POA: Diagnosis not present

## 2015-09-09 LAB — I-STAT CHEM 8, ED
BUN: 80 mg/dL — AB (ref 6–20)
CALCIUM ION: 0.56 mmol/L — AB (ref 1.12–1.23)
CHLORIDE: 105 mmol/L (ref 101–111)
Creatinine, Ser: 13.1 mg/dL — ABNORMAL HIGH (ref 0.61–1.24)
GLUCOSE: 74 mg/dL (ref 65–99)
HCT: 35 % — ABNORMAL LOW (ref 39.0–52.0)
Hemoglobin: 11.9 g/dL — ABNORMAL LOW (ref 13.0–17.0)
Potassium: 5.3 mmol/L — ABNORMAL HIGH (ref 3.5–5.1)
Sodium: 135 mmol/L (ref 135–145)
TCO2: 13 mmol/L (ref 0–100)

## 2015-09-09 LAB — CALCIUM: Calcium: 5.4 mg/dL — CL (ref 8.9–10.3)

## 2015-09-09 MED ORDER — CALCIUM GLUCONATE 500 MG PO TABS: 1.0000 | ORAL_TABLET | Freq: Three times a day (TID) | ORAL | Status: AC

## 2015-09-09 MED ORDER — SODIUM CHLORIDE 0.9 % IV SOLN
2.0000 g | Freq: Once | INTRAVENOUS | Status: AC
  Administered 2015-09-09: 2 g via INTRAVENOUS
  Filled 2015-09-09: qty 20

## 2015-09-09 NOTE — ED Provider Notes (Signed)
60 year old male accepted in sign out pending repeat calcium. Calcium again was found to be low. He was given 2 g of IV calcium. At this time there did not appear to be any indication for admission. Earlier provider had discussed with nephrology trying to get him dialyzed but they refused at this time. On my reassessment patient was eating and in no acute distress. Patient requested social work consult for homelessness. Social work evaluated the patient and provided him with resources. He was discharged home with a prescription for supplemental calcium and instruction of follow-up outpatient.  Leta BaptistEmily Roe Nguyen, MD 09/09/15 1126

## 2015-09-09 NOTE — ED Notes (Signed)
Pt has taken off all leads and monitors, refusing to wear.

## 2015-09-09 NOTE — ED Notes (Signed)
[facility] with cc of "I need dialysis". Pt has no outpatient HD center and gets frequent dialysis at this facility. Last HD was on Sunday per patient. Denies sob, cp and pt is a&ox4.

## 2015-09-09 NOTE — ED Provider Notes (Signed)
TIME SEEN: 5:00 AM  CHIEF COMPLAINT: "I need dialysis"  HPI: Pt is a 60 y.o. male with history of end-stage renal disease on hemodialysis, CAD, hypertension who presents to the emergency department requesting dialysis. Patient comes in with a note from Dr. Arlean HoppingSchertz with nephrology that reports he does not have a dialysis center due to behavioral issues and needs dialysis from the emergency department. Patient reports that he feels like he is swollen all over. Denies any chest pain or shortness of breath. Denies any other complaints. No fever, cough, vomiting or diarrhea. States his last dialysis was Sunday, April 23.  ROS: See HPI Constitutional: no fever  Eyes: no drainage  ENT: no runny nose   Cardiovascular:  no chest pain  Resp: no SOB  GI: no vomiting GU: no dysuria Integumentary: no rash  Allergy: no hives  Musculoskeletal: no leg swelling  Neurological: no slurred speech ROS otherwise negative  PAST MEDICAL HISTORY/PAST SURGICAL HISTORY:  Past Medical History  Diagnosis Date  . Myocardial infarction (HCC)   . Hypertension   . GERD (gastroesophageal reflux disease)   . Pulmonary edema     Just prior to starting HD and resolved with HD. ECHO in 2008 showed normal LVEF, mild diiastolic dysfunction.   . Neuropathy (HCC)     Hx: of in toes  . Hepatitis     Hc: of Hep C  . Cataracts, both eyes     Hx: of "slightly"  . Pancreatitis     Hx: of  . CHF (congestive heart failure) (HCC)   . Diabetes mellitus     not on medication  . 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  Started HD in 2007, [facility].      MEDICATIONS:  Prior to Admission medications   Medication Sig Start Date End Date Taking? Authorizing Provider  amLODipine (NORVASC) 10 MG tablet Take 10 mg by mouth daily.    Historical Provider, MD  b complex-vitamin c-folic acid (NEPHRO-VITE) 0.8 MG TABS Take 0.8 mg by mouth at bedtime.    Historical  Provider, MD  carvedilol (COREG) 3.125 MG tablet Take 3.125 mg by mouth 2 (two) times daily. 01/08/15   Historical Provider, MD  FOSRENOL 1000 MG chewable tablet Chew 1,000 mg by mouth 5 (five) times daily. 01/06/15   Historical Provider, MD  hydrALAZINE (APRESOLINE) 100 MG tablet Take 100 mg by mouth 2 (two) times daily.  11/05/14   Historical Provider, MD  lisinopril (PRINIVIL,ZESTRIL) 40 MG tablet Take 40 mg by mouth 2 (two) times daily.     Historical Provider, MD  PROAIR HFA 108 (90 BASE) MCG/ACT inhaler Inhale 1 puff into the lungs every 4 (four) hours as needed for wheezing. Wheezing 09/22/14   Historical Provider, MD    ALLERGIES:  Allergies  Allergen Reactions  . Eggs Or Egg-Derived Products Other (See Comments)    "Cant Digest" " Yolks"    SOCIAL HISTORY:  Social History  Substance Use Topics  . Smoking status: Current Every Day Smoker -- 0.35 packs/day for 43 years    Types: Cigarettes    Last Attempt to Quit: 12/16/2012  . Smokeless tobacco: Never Used  . Alcohol Use: No    FAMILY HISTORY: Family History  Problem Relation Age of Onset  . Diabetes Mother     EXAM: BP 141/69 mmHg  Pulse 74  Temp(Src) 97.5 F (36.4 C) (Oral)  Resp 18  Ht  (1.778 m)  Wt 165 lb (74.844 kg)  BMI 23.68 kg/m2  SpO2 98% CONSTITUTIONAL: Alert and oriented and responds appropriately to questions. Well-appearing; well-nourished HEAD: Normocephalic EYES: Conjunctivae clear, PERRL ENT: normal nose; no rhinorrhea; moist mucous membranes NECK: Supple, no meningismus, no LAD  CARD: RRR; S1 and S2 appreciated; no murmurs, no clicks, no rubs, no gallops RESP: Normal chest excursion without splinting or tachypnea; breath sounds clear and equal bilaterally; no wheezes, no rhonchi, no rales, no hypoxia or respiratory distress, speaking full sentences ABD/GI: Normal bowel sounds; non-distended; soft, non-tender, no rebound, no guarding, no peritoneal signs BACK:  The back appears normal and is  non-tender to palpation, there is no CVA tenderness EXT: Normal ROM in all joints; non-tender to palpation; no edema; normal capillary refill; no cyanosis, no calf tenderness or swelling    SKIN: Normal color for age and race; warm; no rash NEURO: Moves all extremities equally, sensation to light touch intact diffusely, cranial nerves II through XII intact PSYCH: The patient's mood and manner are appropriate. Grooming and personal hygiene are appropriate.  MEDICAL DECISION MAKING: Patient here requesting dialysis. He is normotensive. No signs of volume overload on exam. Patient's labs show potassium of 5.3. He has a metabolic acidosis likely secondary to uremia. EKG shows no new changes. I do not feel he needs emergent dialysis. We will consult nephrology in the morning when dialysis center is open. Patient is resting comfortably in comfortable with this plan.  ED PROGRESS: 6:35 AM  D/w Dr. Darrick Penna with nephrology. He states the patient meets no criteria for emergent dialysis and I agree. I am asking him for non-emergent dialysis in a patient that comes to the emergency department with a note from his nephrologist Dr. Arlean Hopping stating that he should be dialyzed whenever he comes to the emergency department despite what his potassium level is. Have discussed this with Dr. Darrick Penna who states that he has a full dialysis schedule and cannot dialyze the patient today. He recommended the patient come back to the emergency department tomorrow to have his potassium rechecked and if at that time it is elevated and he needs emergent dialysis they will get him in for dialysis. I have expressed my concern about this plan but unfortunately given he does not meet criteria for emergent dialysis, patient will be discharged.  Discussed at length return precautions with patient.   EKG Interpretation  Date/Time:  Thursday September 09 2015 05:42:34 EDT Ventricular Rate:  72 PR Interval:  241 QRS Duration: 169 QT  Interval:  522 QTC Calculation: 571 R Axis:   -73 Text Interpretation:  Sinus rhythm Prolonged PR interval Right bundle branch block Inferior infarct, old No significant change since last tracing Confirmed by Mckenzi Buonomo,  DO, Dezeray Puccio ([id number]) on 09/09/2015 5:47:53 AM             Layla Maw Earlin Sweeden, DO 09/09/15 1610

## 2015-09-09 NOTE — ED Notes (Signed)
Pt was seen by case manager, given list of shelters and pt calling

## 2015-09-09 NOTE — Discharge Instructions (Signed)
Chronic Kidney Disease Chronic kidney disease occurs when the kidneys are damaged over a long period. The kidneys are two organs that lie on either side of the spine between the middle of the back and the front of the abdomen. The kidneys:  Remove wastes and extra water from the blood.  Produce important hormones. These help keep bones strong, regulate blood pressure, and help create red blood cells.  Balance the fluids and chemicals in the blood and tissues. A small amount of kidney damage may not cause problems, but a large amount of damage may make it difficult or impossible for the kidneys to work the way they should. If steps are not taken to slow down the kidney damage or stop it from getting worse, the kidneys may stop working permanently. Most of the time, chronic kidney disease does not go away. However, it can often be controlled, and those with the disease can usually live normal lives. CAUSES The most common causes of chronic kidney disease are diabetes and high blood pressure (hypertension). Chronic kidney disease may also be caused by:  Diseases that cause the kidneys' filters to become inflamed.  Diseases that affect the immune system.  Genetic diseases.  Medicines that damage the kidneys, such as anti-inflammatory medicines.  Poisoning or exposure to toxic substances.  A reoccurring kidney or urinary infection.  A problem with urine flow. This may be caused by:  Cancer.  Kidney stones.  An enlarged prostate in males. SIGNS AND SYMPTOMS Because the kidney damage in chronic kidney disease occurs slowly, symptoms develop slowly and may not be obvious until the kidney damage becomes severe. A person may have a kidney disease for years without showing any symptoms. Symptoms can include:  Swelling (edema) of the legs, ankles, or feet.  Tiredness (lethargy).  Nausea or vomiting.  Confusion.  Problems with urination, such as:  Decreased urine  production.  Frequent urination, especially at night.  Frequent accidents in children who are potty trained.  Muscle twitches and cramps.  Shortness of breath.  Weakness.  Persistent itchiness.  Loss of appetite.  Metallic taste in the mouth.  Trouble sleeping.  Slowed development in children.  Short stature in children. DIAGNOSIS Chronic kidney disease may be detected and diagnosed by tests, including blood, urine, imaging, or kidney biopsy tests. TREATMENT Most chronic kidney diseases cannot be cured. Treatment usually involves relieving symptoms and preventing or slowing the progression of the disease. Treatment may include:  A special diet. You may need to avoid alcohol and foods thatare salty and high in potassium.  Medicines. These may:  Lower blood pressure.  Relieve anemia.  Relieve swelling.  Protect the bones. HOME CARE INSTRUCTIONS  Follow your prescribed diet. Your health care provider may instruct you to limit daily salt (sodium) and protein intake.  Take medicines only as directed by your health care provider. Do not take any new medicines (prescription, over-the-counter, or nutritional supplements) unless approved by your health care provider. Many medicines can worsen your kidney damage or need to have the dose adjusted.   Quit smoking if you smoke. Talk to your health care provider about a smoking cessation program.  Keep all follow-up visits as directed by your health care provider.  Monitor your blood pressure.  Start or continue an exercise plan.  Get immunizations as directed by your health care provider.  Take vitamin and mineral supplements as directed by your health care provider. SEEK IMMEDIATE MEDICAL CARE IF:  Your symptoms get worse or you develop  new symptoms.  You develop symptoms of end-stage kidney disease. These include:  Headaches.  Abnormally dark or light skin.  Numbness in the hands or feet.  Easy  bruising.  Frequent hiccups.  Menstruation stops.  You have a fever.  You have decreased urine production.  You havepain or bleeding when urinating. MAKE SURE YOU:  Understand these instructions.  Will watch your condition.  Will get help right away if you are not doing well or get worse. FOR MORE INFORMATION   American Association of Kidney Patients: ResidentialShow.iswww.aakp.org  National Kidney Foundation: www.kidney.org  American Kidney Fund: FightingMatch.com.eewww.akfinc.org  [employer] [facility]: www.lifeoptions.org and www.kidneyschool.org   This information is not intended to replace advice given to you by your health care provider. Make sure you discuss any questions you have with your health care provider.   Document Released: 02/08/2008 Document Revised: 05/22/2014 Document Reviewed: 12/29/2011 Elsevier Interactive Patient Education [employer]2016 Elsevier Inc.  Hemodialysis Dialysis is a procedure that replaces some of the work that healthy kidneys do. It is done when you lose about 85-90% of your kidney function and sometimes earlier if your symptoms may be improved by dialysis. During dialysis, wastes, salt, and extra water are removed from the blood, and the level of certain chemicals in the blood (such as potassium) is maintained. Hemodialysis is a type of dialysis in which a machine called a dialyzer is used to filter the blood. Hemodialysis is usually performed by a health care provider at a hospital or dialysis center 3 times a week for 3-5 hours during each visit. It may also be performed more frequently and for longer periods of time at home with training. LET [facility] LLC[facility]OUR HEALTH CARE PROVIDER KNOW ABOUT:  Any allergies you have.  All medicines you are taking, including vitamins, herbs, eye drops, creams, and over-the-counter medicines.  Any blood disorders you have.  Other health problems you have. RISKS AND COMPLICATIONS Generally, hemodialysis is a safe procedure. However, problems  can occur and include:  Low blood pressure (hypotension).  Narrowing or ballooning of a blood vessel or bleeding at the site where blood is removed from the body and returned to the body (vascular access).  An infection or blockage at the vascular access site. BEFORE THE PROCEDURE Before having hemodialysis for the first time, you will need surgery to create a site where blood can be removed from the body and returned to the body (vascular access). There are three types of vascular accesses:  Arteriovenous fistula. This type of access is created when an artery and a vein (usually in the arm) are connected during surgery. The arteriovenous fistula takes 1-6 months to develop after surgery.  Arteriovenous graft. This type of access is created when an artery and a vein in the arm are connected during surgery with a tube. An arteriovenous graft can be used within 2-3 weeks of surgery.  A venous catheter. To create this type of access, a thin, flexible tube (catheter) is placed in a large vein in your neck, chest, or groin. A venous catheter can be used right away. PROCEDURE  Hemodialysis is performed while you are sitting or reclining. During the procedure, you may sleep, read, watch television, or perform any other tasks that can be done while you are in this position. If you experience side effects or any other discomfort during the procedure, let your health care provider know. He or she may be able to make adjustments to help you feel more comfortable. Your hemodialysis process may look  something like this:  Your weight, blood pressure, pulse, and temperature will be measured.  The skin around your vascular access will be sterilized.  Your vascular access will be connected to the dialyzer. If your access is a fistula or graft, two needles will be inserted through the vascular access. The needles will be connected to a plastic tube that is connected to the dialyzer. They will be taped to your  skin so that they do not move out of place. If your access is a catheter, it will be connected to a plastic tube that is connected to the dialyzer.  The dialysis machine will be turned on. This will cause your blood to flow to the dialyzer. The dialyzer will filter and clean your blood before returning it to your body. While the dialysis machine is working, your blood pressure and pulse will be checked several times.  Once dialysis is complete, your vascular access will be disconnected from the dialyzer. If your access is a fistula or graft, the needles will be removed and a bandage (dressing) will be placed over the access. AFTER THE PROCEDURE  Your weight will be measured.  Your blood may be tested to see whether your treatments are removing enough wastes. This is usually done once a month.  You may experience or continue to experience side effects, including:  Dizziness.  Muscle cramps.  Nausea.  Headaches.  Feeling tired. Energy levels usually return to normal the day after the procedure.  Itchiness. Your health care provider may be able to prescribe medicine to relieve it.  Achy or jittery legs. You may feel like kicking your legs. This sometimes causes sleeping problems.  Allergic reaction to the chemicals used to sterilize the dialyzer.   This information is not intended to replace advice given to you by your health care provider. Make sure you discuss any questions you have with your health care provider.   Document Released: 08/26/2012 Document Revised: 05/22/2014 Document Reviewed: 08/26/2012 Elsevier Interactive Patient Education 2016 [employer].  Hypocalcemia, Adult - take the calcium prescribed Hypocalcemia is low blood calcium. Calcium is important for cells to function in the body. Low blood calcium can cause a variety of symptoms and problems. CAUSES   Low levels of a body protein called albumin.  Problems with the parathyroid glands or surgical removal of  the parathyroid glands. The parathyroid glands maintain the body's level of calcium.  Decreased production or improper use of parathyroid hormone.  Lack (deficiency) of vitamin D or magnesium or both.  Intestinal problems that interfere with nutrient absorption.  Alcoholism.  Kidney problems.  Inflammation of the pancreas (pancreatitis).  Certain medicines.  Severe infections (sepsis).  Infiltrative diseases. With these diseases the parathyroid glands are filled with cells or substances that are not normally present. Examples include:  Sarcoidosis.  Hemachromatosis.  Breakdown of large amounts of muscle fiber.  High levels of phosphate in the body.  Cancer.  Massive blood transfusions which usually occur with severe trauma. SYMPTOMS   Numbness and tingling in the fingers, toes, or around the mouth.  Muscle aches or cramps, especially in the legs, feet, and back.  Muscle twitches.  Shortness of breath or wheezing.  Difficulty swallowing.  Changes in the sound of the voice.  General weakness.  Fainting.  Fast heart beats (palpitations).  Chest pain.  Irritability.  Difficulty thinking.  Memory problems or confusion.  Severe fatigue.  Changes in personality.  Depression and anxiety.  Shaking uncontrollably (seizures).  Coarse, brittle hair  and nails.  Dry skin or lasting (chronic) skin diseases (psoriasis, eczema, or dermatitis).  Clouding of the eye lens (cataracts).  Abdominal cramping or pain. DIAGNOSIS  Hypocalcemia is usually diagnosed through blood tests that reveal a low level of blood calcium. Other tests, such as a recording of the electrical activity of the heart (electrocardiogram, EKG), may be performed in order to diagnose the underlying cause of the condition. TREATMENT  Treatment for hypocalcemia includes giving calcium supplements. These can be given by mouth or by intravenous (IV) access tube, depending on the severity of the  symptoms and deficiency. Other minerals (electrolytes), such as magnesium, may also be given. HOME CARE INSTRUCTIONS   Meet with a dietitian to make sure you are eating the most healthful diet possible, or follow diet instructions as directed by your caregiver.  Follow up with your caregiver as directed. SEEK IMMEDIATE MEDICAL CARE IF:   You develop chest pain.  You develop persistent rapid or irregular heartbeats.  You have difficulty breathing.  You faint.  You develop increased fatigue.  You have new swelling in the feet, ankles, or legs.  You develop increased muscle twitching.  You start to have seizures.  You develop confusion.  You develop mood, memory, or personality changes. MAKE SURE YOU:   Understand these instructions.  Will watch your condition.  Will get help right away if you are not doing well or get worse.   This information is not intended to replace advice given to you by your health care provider. Make sure you discuss any questions you have with your health care provider.   Document Released: 10/19/2009 Document Revised: 07/24/2011 Document Reviewed: 09/16/2014 Elsevier Interactive Patient Education [employer].

## 2015-09-09 NOTE — ED Notes (Signed)
Renal lunch tray ordered @ 1050

## 2015-09-09 NOTE — Progress Notes (Signed)
CSW engaged with Patient at his bedside to discuss homelessness concerns. Patient reports that he has been homeless for about 1 month since his father's home was foreclosed. Patient reports that his father is living with a cousin now but notes that none of his family has offered to help him with housing at this point so that is not an option. Patient reports that [facility] [place] but his bed was given away. [facility] where he can go to utilize  showers, laundry, barbershop, phone bank, mailroom, computer lab, medical clinic, among other critical resources. CSW provided Patient with a list of area shelters and contact information for each shelter. Patient denies any further needs at this time. CSW signing off. Please contact if new need(s) arise.      Lance MussAshley Gardner,MSW, LCSW [facility] Clinical Social Worker [phone]

## 2015-09-10 ENCOUNTER — Emergency Department (HOSPITAL_COMMUNITY): Payer: Medicare Other

## 2015-09-10 ENCOUNTER — Non-Acute Institutional Stay (HOSPITAL_COMMUNITY)
Admission: EM | Admit: 2015-09-10 | Discharge: 2015-09-10 | Disposition: A | Discharge status: Home / Self Care | Payer: Medicare Other | Attending: Emergency Medicine | Admitting: Emergency Medicine

## 2015-09-10 ENCOUNTER — Other Ambulatory Visit: Payer: Self-pay

## 2015-09-10 ENCOUNTER — Encounter (HOSPITAL_COMMUNITY): Payer: Self-pay

## 2015-09-10 DIAGNOSIS — I2510 Atherosclerotic heart disease of native coronary artery without angina pectoris: Secondary | ICD-10-CM | POA: Insufficient documentation

## 2015-09-10 DIAGNOSIS — Z79899 Other long term (current) drug therapy: Secondary | ICD-10-CM | POA: Insufficient documentation

## 2015-09-10 DIAGNOSIS — E1142 Type 2 diabetes mellitus with diabetic polyneuropathy: Secondary | ICD-10-CM | POA: Diagnosis not present

## 2015-09-10 DIAGNOSIS — Z590 Homelessness: Secondary | ICD-10-CM | POA: Insufficient documentation

## 2015-09-10 DIAGNOSIS — I132 Hypertensive heart and chronic kidney disease with heart failure and with stage 5 chronic kidney disease, or end stage renal disease: Secondary | ICD-10-CM | POA: Diagnosis not present

## 2015-09-10 DIAGNOSIS — I252 Old myocardial infarction: Secondary | ICD-10-CM | POA: Insufficient documentation

## 2015-09-10 DIAGNOSIS — I509 Heart failure, unspecified: Secondary | ICD-10-CM | POA: Insufficient documentation

## 2015-09-10 DIAGNOSIS — F17210 Nicotine dependence, cigarettes, uncomplicated: Secondary | ICD-10-CM | POA: Insufficient documentation

## 2015-09-10 DIAGNOSIS — R0602 Shortness of breath: Secondary | ICD-10-CM | POA: Diagnosis present

## 2015-09-10 DIAGNOSIS — Z992 Dependence on renal dialysis: Secondary | ICD-10-CM | POA: Diagnosis not present

## 2015-09-10 DIAGNOSIS — E872 Acidosis, unspecified: Secondary | ICD-10-CM

## 2015-09-10 DIAGNOSIS — N186 End stage renal disease: Secondary | ICD-10-CM | POA: Diagnosis not present

## 2015-09-10 DIAGNOSIS — E875 Hyperkalemia: Secondary | ICD-10-CM | POA: Diagnosis not present

## 2015-09-10 DIAGNOSIS — E1122 Type 2 diabetes mellitus with diabetic chronic kidney disease: Secondary | ICD-10-CM | POA: Insufficient documentation

## 2015-09-10 DIAGNOSIS — K219 Gastro-esophageal reflux disease without esophagitis: Secondary | ICD-10-CM | POA: Insufficient documentation

## 2015-09-10 LAB — CBC WITH DIFFERENTIAL/PLATELET
BASOS PCT: 1 %
Basophils Absolute: 0.1 10*3/uL (ref 0.0–0.1)
EOS PCT: 16 %
Eosinophils Absolute: 1.5 10*3/uL — ABNORMAL HIGH (ref 0.0–0.7)
HEMATOCRIT: 36.2 % — AB (ref 39.0–52.0)
HEMOGLOBIN: 11.7 g/dL — AB (ref 13.0–17.0)
LYMPHS PCT: 15 %
Lymphs Abs: 1.4 10*3/uL (ref 0.7–4.0)
MCH: 30.2 pg (ref 26.0–34.0)
MCHC: 32.3 g/dL (ref 30.0–36.0)
MCV: 93.3 fL (ref 78.0–100.0)
MONO ABS: 0.9 10*3/uL (ref 0.1–1.0)
MONOS PCT: 9 %
NEUTROS PCT: 59 %
Neutro Abs: 5.7 10*3/uL (ref 1.7–7.7)
Platelets: 99 10*3/uL — ABNORMAL LOW (ref 150–400)
RBC: 3.88 MIL/uL — ABNORMAL LOW (ref 4.22–5.81)
RDW: 15.7 % — ABNORMAL HIGH (ref 11.5–15.5)
WBC: 9.6 10*3/uL (ref 4.0–10.5)

## 2015-09-10 LAB — I-STAT CHEM 8, ED
BUN: 104 mg/dL — AB (ref 6–20)
CALCIUM ION: 0.65 mmol/L — AB (ref 1.12–1.23)
CHLORIDE: 104 mmol/L (ref 101–111)
Creatinine, Ser: 16.2 mg/dL — ABNORMAL HIGH (ref 0.61–1.24)
Glucose, Bld: 64 mg/dL — ABNORMAL LOW (ref 65–99)
HEMATOCRIT: 38 % — AB (ref 39.0–52.0)
Hemoglobin: 12.9 g/dL — ABNORMAL LOW (ref 13.0–17.0)
Potassium: 6.5 mmol/L (ref 3.5–5.1)
Sodium: 135 mmol/L (ref 135–145)
TCO2: 16 mmol/L (ref 0–100)

## 2015-09-10 LAB — BASIC METABOLIC PANEL
ANION GAP: 22 — AB (ref 5–15)
BUN: 109 mg/dL — ABNORMAL HIGH (ref 6–20)
CHLORIDE: 101 mmol/L (ref 101–111)
CO2: 16 mmol/L — AB (ref 22–32)
Calcium: 6.3 mg/dL — CL (ref 8.9–10.3)
Creatinine, Ser: 15.83 mg/dL — ABNORMAL HIGH (ref 0.61–1.24)
GFR calc Af Amer: 3 mL/min — ABNORMAL LOW (ref >60)
GFR calc non Af Amer: 3 mL/min — ABNORMAL LOW (ref >60)
GLUCOSE: 70 mg/dL (ref 65–99)
POTASSIUM: 7.2 mmol/L — AB (ref 3.5–5.1)
Sodium: 139 mmol/L (ref 135–145)

## 2015-09-10 LAB — I-STAT TROPONIN, ED: Troponin i, poc: 0.05 ng/mL (ref 0.00–0.08)

## 2015-09-10 MED ORDER — DEXTROSE 50 % IV SOLN
1.0000 | Freq: Once | INTRAVENOUS | Status: AC
  Administered 2015-09-10: 50 mL via INTRAVENOUS
  Filled 2015-09-10: qty 50

## 2015-09-10 MED ORDER — CALCIUM CARBONATE ANTACID 500 MG PO CHEW
3.0000 | CHEWABLE_TABLET | Freq: Once | ORAL | Status: AC
  Administered 2015-09-10: 600 mg via ORAL

## 2015-09-10 MED ORDER — CALCIUM GLUCONATE 10 % IV SOLN: 1.0000 g | Freq: Once | INTRAVENOUS | Status: DC

## 2015-09-10 MED ORDER — ACETAMINOPHEN 325 MG PO TABS
650.0000 mg | ORAL_TABLET | Freq: Once | ORAL | Status: AC
  Administered 2015-09-10: 650 mg via ORAL
  Filled 2015-09-10: qty 2

## 2015-09-10 MED ORDER — CALCITRIOL 0.25 MCG PO CAPS
ORAL_CAPSULE | ORAL | Status: AC
  Filled 2015-09-10: qty 3

## 2015-09-10 MED ORDER — INSULIN ASPART 100 UNIT/ML IV SOLN
5.0000 [IU] | Freq: Once | INTRAVENOUS | Status: AC
  Administered 2015-09-10: 5 [IU] via INTRAVENOUS
  Filled 2015-09-10 (×2): qty 1

## 2015-09-10 MED ORDER — CALCITRIOL 0.5 MCG PO CAPS
0.7500 ug | ORAL_CAPSULE | Freq: Once | ORAL | Status: AC
  Administered 2015-09-10: 0.75 ug via ORAL

## 2015-09-10 MED ORDER — CALCIUM CARBONATE ANTACID 500 MG PO CHEW
CHEWABLE_TABLET | ORAL | Status: AC
  Filled 2015-09-10: qty 3

## 2015-09-10 MED ORDER — ALBUTEROL SULFATE (2.5 MG/3ML) 0.083% IN NEBU
20.0000 mg | INHALATION_SOLUTION | Freq: Once | RESPIRATORY_TRACT | Status: AC
  Administered 2015-09-10: 20 mg via RESPIRATORY_TRACT
  Filled 2015-09-10 (×2): qty 24

## 2015-09-10 NOTE — ED Provider Notes (Signed)
CSN: [id number]     Arrival date & time 09/10/15  1029 History   First MD Initiated Contact with Patient 09/10/15 1111     Chief Complaint  Patient presents with  . Vascular Access Problem  . Shortness of Breath     (Consider location/radiation/quality/duration/timing/severity/associated sxs/prior Treatment) HPI [name] is a 60 y.o. male with PMH significant for ESRD on HD, CAD, HTN who who presents requesting dialysis.  Patient does not have a dialysis center.  He has been discharged from his dialysis center, Dr. Arlean Hopping, due to behavioral issues and needs dialysis from the ED.  Patient was seen in ED yesterday with K 5.3.  He did not meet criteria for emergent HD, dialysis was full to capacity, and patient was told to return to ED today.  His last dialysis was Sunday, 09/05/15.  He states he hurts all over.  He endorses shortness of breath and generalized chest pain.  Denies fever, cough, abdominal pain, N/V.  Patient is homeless, and SW provided him with resources yesterday in the ED. Symptom onset gradual, constant, and worsening.   Past Medical History  Diagnosis Date  . Myocardial infarction (HCC)   . Hypertension   . GERD (gastroesophageal reflux disease)   . Pulmonary edema     Just prior to starting HD and resolved with HD. ECHO in 2008 showed normal LVEF, mild diiastolic dysfunction.   . Neuropathy (HCC)     Hx: of in toes  . Hepatitis     Hc: of Hep C  . Cataracts, both eyes     Hx: of "slightly"  . Pancreatitis     Hx: of  . CHF (congestive heart failure) (HCC)   . Diabetes mellitus     not on medication  . 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  Started HD in 2007, gets HD at [facility] on MWF schedule.     Past Surgical History  Procedure Laterality Date  . Dialysis fistula creation Left   . Cholecystectomy    . Colonoscopy      Hx: of  . Ligation of arteriovenous  fistula Left 11/06/2012    Procedure: PLICATION OF  BRACHIO-CEPHALIC ARTERIOVENOUS  FISTULA;  Surgeon: Fransisco Hertz, MD;  Location: [facility] OR;  Service: Vascular;  Laterality: Left;  . Pars plana vitrectomy Right 11/10/2014    Procedure: PARS PLANA VITRECTOMY WITH 25 GAUGE;  Surgeon: Sherrie George, MD;  Location: [facility] OR;  Service: Ophthalmology;  Laterality: Right;  . Membrane peel Right 11/10/2014    Procedure: MEMBRANE PEEL;  Surgeon: Sherrie George, MD;  Location: [facility] OR;  Service: Ophthalmology;  Laterality: Right;  . Gas/fluid exchange Right 11/10/2014    Procedure: GAS/FLUID EXCHANGE;  Surgeon: Sherrie George, MD;  Location: [facility] OR;  Service: Ophthalmology;  Laterality: Right;  . Laser photo ablation Right 11/10/2014    Procedure: LASER PHOTO ABLATION;  Surgeon: Sherrie George, MD;  Location: [facility] OR;  Service: Ophthalmology;  Laterality: Right;  Endo Laser   Family History  Problem Relation Age of Onset  . Diabetes Mother    Social History  Substance Use Topics  . Smoking status: Current Every Day Smoker -- 0.35 packs/day for 43 years    Types: Cigarettes    Last Attempt to Quit: 12/16/2012  . Smokeless tobacco: Never Used  . Alcohol Use: No    Review of Systems All other systems negative unless otherwise stated in  HPI    Allergies  Eggs or egg-derived products  Home Medications   Prior to Admission medications   Medication Sig Start Date End Date Taking? Authorizing Provider  amLODipine (NORVASC) 10 MG tablet Take 10 mg by mouth daily.   Yes Historical Provider, MD  b complex-vitamin c-folic acid (NEPHRO-VITE) 0.8 MG TABS Take 0.8 mg by mouth at bedtime.   Yes Historical Provider, MD  calcium gluconate 500 MG tablet Take 1 tablet (500 mg total) by mouth 3 (three) times daily. 09/09/15  Yes Leta Baptist, MD  carvedilol (COREG) 3.125 MG tablet Take 3.125 mg by mouth 2 (two) times daily. 01/08/15  Yes Historical Provider, MD  FOSRENOL 1000 MG chewable tablet Chew 1,000 mg by mouth 5 (five) times daily. 01/06/15  Yes Historical  Provider, MD  hydrALAZINE (APRESOLINE) 100 MG tablet Take 100 mg by mouth 2 (two) times daily.  11/05/14  Yes Historical Provider, MD  lisinopril (PRINIVIL,ZESTRIL) 40 MG tablet Take 40 mg by mouth 2 (two) times daily.    Yes Historical Provider, MD  PROAIR HFA 108 (90 BASE) MCG/ACT inhaler Inhale 1 puff into the lungs every 4 (four) hours as needed for wheezing. Wheezing 09/22/14  Yes Historical Provider, MD   BP 200/176 mmHg  Pulse 66  Temp(Src) 98 F (36.7 C) (Oral)  Resp 20  Ht  (1.778 m)  Wt 74.844 kg  BMI 23.68 kg/m2  SpO2 96% Physical Exam  Constitutional: He is oriented to person, place, and time. He appears well-developed and well-nourished.  Non-toxic appearance. He does not have a sickly appearance. He does not appear ill.  Patient wearing sun glasses and smells of marijuana.  HENT:  Head: Normocephalic and atraumatic.  Mouth/Throat: Oropharynx is clear and moist.  Eyes: Conjunctivae are normal.  Neck: Normal range of motion. Neck supple.  Cardiovascular: Normal rate, regular rhythm and normal heart sounds.   No murmur heard. Pulmonary/Chest: Effort normal and breath sounds normal. No accessory muscle usage or stridor. No respiratory distress. He has no wheezes. He has no rhonchi. He has no rales.  Abdominal: Soft. Bowel sounds are normal. He exhibits no distension. There is no tenderness.  Musculoskeletal: Normal range of motion.  Lymphadenopathy:    He has no cervical adenopathy.  Neurological: He is alert and oriented to person, place, and time.  Speech clear without dysarthria.   Appears high.   Skin: Skin is warm and dry.  AV fistula in RUE with palpable thrill.   Psychiatric: He has a normal mood and affect. His behavior is normal.    ED Course  Procedures (including critical care time)  CRITICAL CARE Performed by: Cheri Fowler   Total critical care time: 30 minutes  Critical care time was exclusive of separately billable procedures and treating other  patients.  Critical care was necessary to treat or prevent imminent or life-threatening deterioration.  Critical care was time spent personally by me on the following activities: development of treatment plan with patient and/or surrogate as well as nursing, discussions with consultants, evaluation of patient's response to treatment, examination of patient, obtaining history from patient or surrogate, ordering and performing treatments and interventions, ordering and review of laboratory studies, ordering and review of radiographic studies, pulse oximetry and re-evaluation of patient's condition.  Labs Review Labs Reviewed  CBC WITH DIFFERENTIAL/PLATELET - Abnormal; Notable for the following:    RBC 3.88 (*)    Hemoglobin 11.7 (*)    HCT 36.2 (*)    RDW 15.7 (*)  Platelets 99 (*)    Eosinophils Absolute 1.5 (*)    All other components within normal limits  BASIC METABOLIC PANEL - Abnormal; Notable for the following:    Potassium 7.2 (*)    CO2 16 (*)    BUN 109 (*)    Creatinine, Ser 15.83 (*)    Calcium 6.3 (*)    GFR calc non Af Amer 3 (*)    GFR calc Af Amer 3 (*)    Anion gap 22 (*)    All other components within normal limits  I-STAT CHEM 8, ED - Abnormal; Notable for the following:    Potassium 6.5 (*)    BUN 104 (*)    Creatinine, Ser 16.20 (*)    Glucose, Bld 64 (*)    Calcium, Ion 0.65 (*)    Hemoglobin 12.9 (*)    HCT 38.0 (*)    All other components within normal limits  Rosezena SensorI-STAT TROPOININ, ED    Imaging Review Dg Chest 2 View  09/10/2015  CLINICAL DATA:  Shortness of breath.  Lethargy. EXAM: CHEST  2 VIEW COMPARISON:  Radiographs 08/29/2015 FINDINGS: The previous right mid lung zone opacity is no longer seen. No new consolidation. Cardiomegaly is stable. Vascular congestion is unchanged. No alveolar edema. No evidence of pleural effusion. No pneumothorax. Vascular stent in the region of the left subclavian. No acute osseous abnormalities are seen. IMPRESSION: 1.  Unchanged cardiomegaly and vascular congestion. 2. Previous right mid lung zone opacity has resolved. Electronically Signed   By: Rubye OaksMelanie  Ehinger M.D.   On: 09/10/2015 12:02   I have personally reviewed and evaluated these images and lab results as part of my medical decision-making.   EKG Interpretation   Date/Time:  Friday September 10 2015 11:17:29 EDT Ventricular Rate:  82 PR Interval:  231 QRS Duration: 177 QT Interval:  473 QTC Calculation: 552 R Axis:   66 Text Interpretation:  Sinus rhythm Prolonged PR interval Right bundle  branch block No significant change since last tracing Confirmed by  [facility]LOS[facility] MD, ERIN ([id number]) on 09/10/2015 11:57:37 AM Also confirmed by  [facility], ERIN ([id number]), editor [place] CT, Jola BabinskiMarilyn [phone]([id number])  on  09/10/2015 11:58:50 AM      MDM   Final diagnoses:  ESRD on hemodialysis (HCC)  Hyperkalemia  Hypocalcemia  Metabolic acidosis   He presents to the emergency department requesting dialysis. VSS, NAD. Patient was seen yesterday with potassium 5.3, no EKG changes. Patient instructed to return today for HD. Today, K 6.5 on chem8, with no EKG changes. Concern for hemolysis on chem8 by lab.  Held D50 and insulin.  BMP shows K 7.2.  Will give calcium gluconate, D50, insulin, and albuterol.  Spoke with Dr. Allena KatzPatel, nephrology, who will admit for emergent dialysis.  Case has been discussed with Dr. Dalene SeltzerSchlossman who agrees with the above plan for emergent dialysis.      Cheri FowlerKayla Rhiley Solem, PA-C 09/10/15 1459  Cheri FowlerKayla Audley Hinojos, PA-C 09/10/15 1500  Alvira MondayErin Schlossman, MD 09/12/15 [id number]

## 2015-09-10 NOTE — ED Notes (Signed)
Pt here for dialysis. He is a MWF patient, last dialyzed on Sunday. Pt appears to be lethargic, room smells of marijuana. No respiratory distress noted.

## 2015-09-10 NOTE — ED Notes (Signed)
Critical potassium of 7.2 and calcium 6.3 MD made aware.

## 2015-09-10 NOTE — Progress Notes (Addendum)
K 7.2 and  Ca 6.3 Cr 15.83 BUN 109 Hgb 11.7 - last HD. He said his last HD tmt was 4/23 - was here yesterday K was 5.3 and Ca very low at 5.4 which was treated with 2 gm IV calcium.  Will give calcitriol 0.75 and 3 tums today with HD to help with calcium. He was on 0.75 of calcitriol prior to losing his place at his outpt unit.  Plan 1 hours 1 K bath then 2 K 3.5 Ca bath. He had been given 1 amp of Ca gluconate in the ED for ^ K

## 2015-09-13 NOTE — Discharge Summary (Signed)
Physician Discharge Summary  Patient ID: [name] MRN: [id number] DOB/AGE: 60/06/1955 60 y.o.  Admit date: 09/05/2015 Discharge date: 09/13/2015  Admission Diagnoses: Diagnoses:   Uremia   HTN (hypertension)   End stage renal disease (HCC)   Hyperkalemia   Nausea   Volume overload  Discharge Diagnoses:  Principal Problem:   Uremia Active Problems:   HTN (hypertension)   End stage renal disease (HCC)   Hyperkalemia   Nausea   Volume overload   Discharged Condition: good  Hospital Course: [name] is a 60 y.o. male with hx of ESRD '08, DM, HTN, prior substance abuse/ cirrhosis/ pancreatitis, now homeless and recently discharged from his outpatient dialysis due to behavior problems. Presents to ED requesting dialysis. K 5.6. Reports nausea, fatigue and painful swollen legs main issues. Asked to see for dialysis. Had to admit patient as the inpatient HD unit was closed for cleaning.   Probs >  1) Uremia - improved w HD 2) Vol excess improved w HD 3) ESRD no outpatient unit 4) Hyperkalemia sp HD  Consults: None   Treatments: dialysis: Hemodialysis  Discharge Exam: Blood pressure 168/85, pulse 72, temperature 98.4 F (36.9 C), temperature source Oral, resp. rate 18, SpO2 98 %. Gen alert, no distress, calm No rash, cyanosis or gangrene Sclera anicteric, throat clear  No jvd or bruits Chest clear bilat RRR no MRG Abd soft ntnd no mass or ascites +bs GU normal male MS no joint effusions or deformity Ext mild bilat LE edema / no wounds or ulcers Neuro is alert, Ox 3   Disposition: 01-Home or Self Care     Medication List    ASK your doctor about these medications        amLODipine 10 MG tablet  Commonly known as:  NORVASC  Take 10 mg by mouth daily.     b complex-vitamin c-folic acid 0.8 MG Tabs tablet  Take 0.8 mg by mouth at bedtime.     carvedilol 3.125 MG tablet  Commonly known as:  COREG  Take 3.125 mg by mouth 2 (two) times daily.      FOSRENOL 1000 MG chewable tablet  Generic drug:  lanthanum  Chew 1,000 mg by mouth 5 (five) times daily.     hydrALAZINE 100 MG tablet  Commonly known as:  APRESOLINE  Take 100 mg by mouth 2 (two) times daily.     lisinopril 40 MG tablet  Commonly known as:  PRINIVIL,ZESTRIL  Take 40 mg by mouth 2 (two) times daily.     PROAIR HFA 108 (90 Base) MCG/ACT inhaler  Generic drug:  albuterol  Inhale 1 puff into the lungs every 4 (four) hours as needed for wheezing. Wheezing         Signed: Kyo Cocuzza D 09/13/2015, 10:03 AM

## 2015-09-15 ENCOUNTER — Encounter (HOSPITAL_COMMUNITY): Payer: Self-pay | Admitting: Emergency Medicine

## 2015-09-15 ENCOUNTER — Non-Acute Institutional Stay (HOSPITAL_COMMUNITY)
Admission: EM | Admit: 2015-09-15 | Discharge: 2015-09-16 | Discharge status: Against Medical Advice | Payer: Medicare Other | Source: Home / Self Care

## 2015-09-15 DIAGNOSIS — I509 Heart failure, unspecified: Secondary | ICD-10-CM | POA: Diagnosis not present

## 2015-09-15 DIAGNOSIS — R197 Diarrhea, unspecified: Secondary | ICD-10-CM | POA: Diagnosis not present

## 2015-09-15 DIAGNOSIS — I120 Hypertensive chronic kidney disease with stage 5 chronic kidney disease or end stage renal disease: Secondary | ICD-10-CM | POA: Diagnosis not present

## 2015-09-15 DIAGNOSIS — T82898A Other specified complication of vascular prosthetic devices, implants and grafts, initial encounter: Secondary | ICD-10-CM | POA: Diagnosis present

## 2015-09-15 DIAGNOSIS — Z992 Dependence on renal dialysis: Secondary | ICD-10-CM | POA: Diagnosis not present

## 2015-09-15 DIAGNOSIS — N186 End stage renal disease: Secondary | ICD-10-CM | POA: Diagnosis present

## 2015-09-15 DIAGNOSIS — E1122 Type 2 diabetes mellitus with diabetic chronic kidney disease: Secondary | ICD-10-CM | POA: Diagnosis not present

## 2015-09-15 DIAGNOSIS — F17210 Nicotine dependence, cigarettes, uncomplicated: Secondary | ICD-10-CM | POA: Diagnosis not present

## 2015-09-15 DIAGNOSIS — Y658 Other specified misadventures during surgical and medical care: Secondary | ICD-10-CM | POA: Diagnosis not present

## 2015-09-15 DIAGNOSIS — I252 Old myocardial infarction: Secondary | ICD-10-CM | POA: Diagnosis not present

## 2015-09-15 DIAGNOSIS — Z5321 Procedure and treatment not carried out due to patient leaving prior to being seen by health care provider: Secondary | ICD-10-CM

## 2015-09-15 DIAGNOSIS — R05 Cough: Secondary | ICD-10-CM | POA: Diagnosis not present

## 2015-09-15 LAB — COMPREHENSIVE METABOLIC PANEL
ALT: 23 U/L (ref 17–63)
AST: 33 U/L (ref 15–41)
Albumin: 2.9 g/dL — ABNORMAL LOW (ref 3.5–5.0)
Alkaline Phosphatase: 117 U/L (ref 38–126)
Anion gap: 21 — ABNORMAL HIGH (ref 5–15)
BUN: 120 mg/dL — AB (ref 6–20)
CHLORIDE: 100 mmol/L — AB (ref 101–111)
CO2: 15 mmol/L — AB (ref 22–32)
Calcium: 6.7 mg/dL — ABNORMAL LOW (ref 8.9–10.3)
Creatinine, Ser: 16.66 mg/dL — ABNORMAL HIGH (ref 0.61–1.24)
GFR calc Af Amer: 3 mL/min — ABNORMAL LOW (ref >60)
GFR, EST NON AFRICAN AMERICAN: 3 mL/min — AB (ref >60)
GLUCOSE: 136 mg/dL — AB (ref 65–99)
Potassium: 6.2 mmol/L (ref 3.5–5.1)
SODIUM: 136 mmol/L (ref 135–145)
TOTAL PROTEIN: 7.3 g/dL (ref 6.5–8.1)
Total Bilirubin: 0.6 mg/dL (ref 0.3–1.2)

## 2015-09-15 LAB — CBC
HEMATOCRIT: 31.4 % — AB (ref 39.0–52.0)
HEMOGLOBIN: 10.3 g/dL — AB (ref 13.0–17.0)
MCH: 29.9 pg (ref 26.0–34.0)
MCHC: 32.8 g/dL (ref 30.0–36.0)
MCV: 91 fL (ref 78.0–100.0)
Platelets: 93 10*3/uL — ABNORMAL LOW (ref 150–400)
RBC: 3.45 MIL/uL — ABNORMAL LOW (ref 4.22–5.81)
RDW: 15.5 % (ref 11.5–15.5)
WBC: 6.1 10*3/uL (ref 4.0–10.5)

## 2015-09-15 NOTE — Progress Notes (Signed)
This RN spoke with [name].  He was reached at telephone number: [phone].  [name] stated that he was "getting the run around" in the emergency department.  Stated that "they told me they weren't going to treat me."  He informed this RN that [facility].  This RN explained that in his situation (outpatient dialyzing in hospital), there are often times extended waits due to scheduling and emergent dialysis.  Also explained and emphasized that his potassium level of 6.1 is dangerously elevated and merits dialysis today.  He expressed a desire to dialyze tomorrow, stating that HDU closes at six thirty.  This RN explained that HDU is open 24 hours and re emphasized the need for dialysis this evening, and gave instruction to request the ED triage to phone HDU upon his arrival so that HDU staff could give him a general estimation of wait time.    [name] verbalized understanding of situation and stated he would come back to the ED this evening for dialysis.

## 2015-09-15 NOTE — ED Notes (Signed)
Pt called multiple times and not answer to take to room.

## 2015-09-15 NOTE — Progress Notes (Signed)
This RN attempted to call ED for report.  Spoke with Jacki ConesLaurie, Consulting civil engineercharge RN.  Per Jacki ConesLaurie, pt was called from triage several times to be escorted to ED room.  Pt did not answer telephone call.  Patient is now listed at OTF.  This RN to attempt to reach patients telephone.

## 2015-09-15 NOTE — ED Notes (Signed)
Pt here for routine dialysis. Pt states he missed Monday. Pt comes here for routine dialysis and was not treated Monday due to potassium being normal. Pt c/o diarrhea and cough.

## 2015-09-16 ENCOUNTER — Emergency Department (HOSPITAL_COMMUNITY)
Admission: EM | Admit: 2015-09-16 | Discharge: 2015-09-16 | Disposition: A | Discharge status: Home / Self Care | Payer: Medicare Other | Attending: Emergency Medicine | Admitting: Emergency Medicine

## 2015-09-16 ENCOUNTER — Emergency Department (HOSPITAL_COMMUNITY): Payer: Medicare Other

## 2015-09-16 ENCOUNTER — Encounter (HOSPITAL_COMMUNITY): Payer: Self-pay | Admitting: Emergency Medicine

## 2015-09-16 DIAGNOSIS — E875 Hyperkalemia: Secondary | ICD-10-CM

## 2015-09-16 DIAGNOSIS — T82898A Other specified complication of vascular prosthetic devices, implants and grafts, initial encounter: Secondary | ICD-10-CM | POA: Diagnosis not present

## 2015-09-16 DIAGNOSIS — R197 Diarrhea, unspecified: Secondary | ICD-10-CM | POA: Insufficient documentation

## 2015-09-16 DIAGNOSIS — Y658 Other specified misadventures during surgical and medical care: Secondary | ICD-10-CM | POA: Insufficient documentation

## 2015-09-16 DIAGNOSIS — R05 Cough: Secondary | ICD-10-CM | POA: Insufficient documentation

## 2015-09-16 DIAGNOSIS — E1122 Type 2 diabetes mellitus with diabetic chronic kidney disease: Secondary | ICD-10-CM | POA: Insufficient documentation

## 2015-09-16 DIAGNOSIS — N186 End stage renal disease: Secondary | ICD-10-CM | POA: Insufficient documentation

## 2015-09-16 DIAGNOSIS — I252 Old myocardial infarction: Secondary | ICD-10-CM | POA: Insufficient documentation

## 2015-09-16 DIAGNOSIS — F17210 Nicotine dependence, cigarettes, uncomplicated: Secondary | ICD-10-CM | POA: Insufficient documentation

## 2015-09-16 DIAGNOSIS — I509 Heart failure, unspecified: Secondary | ICD-10-CM | POA: Insufficient documentation

## 2015-09-16 DIAGNOSIS — I120 Hypertensive chronic kidney disease with stage 5 chronic kidney disease or end stage renal disease: Secondary | ICD-10-CM | POA: Insufficient documentation

## 2015-09-16 DIAGNOSIS — Z992 Dependence on renal dialysis: Secondary | ICD-10-CM | POA: Insufficient documentation

## 2015-09-16 LAB — CBC
HCT: 30 % — ABNORMAL LOW (ref 39.0–52.0)
HEMOGLOBIN: 10 g/dL — AB (ref 13.0–17.0)
MCH: 29.7 pg (ref 26.0–34.0)
MCHC: 33.3 g/dL (ref 30.0–36.0)
MCV: 89 fL (ref 78.0–100.0)
PLATELETS: 91 10*3/uL — AB (ref 150–400)
RBC: 3.37 MIL/uL — ABNORMAL LOW (ref 4.22–5.81)
RDW: 15.4 % (ref 11.5–15.5)
WBC: 5.9 10*3/uL (ref 4.0–10.5)

## 2015-09-16 LAB — BASIC METABOLIC PANEL
ANION GAP: 21 — AB (ref 5–15)
BUN: 126 mg/dL — ABNORMAL HIGH (ref 6–20)
CALCIUM: 6.6 mg/dL — AB (ref 8.9–10.3)
CO2: 14 mmol/L — AB (ref 22–32)
Chloride: 99 mmol/L — ABNORMAL LOW (ref 101–111)
Creatinine, Ser: 17.43 mg/dL — ABNORMAL HIGH (ref 0.61–1.24)
GFR calc non Af Amer: 3 mL/min — ABNORMAL LOW (ref >60)
GFR, EST AFRICAN AMERICAN: 3 mL/min — AB (ref >60)
Glucose, Bld: 74 mg/dL (ref 65–99)
Potassium: 6.5 mmol/L (ref 3.5–5.1)
Sodium: 134 mmol/L — ABNORMAL LOW (ref 135–145)

## 2015-09-16 MED ORDER — ALBUTEROL SULFATE (2.5 MG/3ML) 0.083% IN NEBU
2.5000 mg | INHALATION_SOLUTION | Freq: Once | RESPIRATORY_TRACT | Status: AC
Start: 2015-09-16 — End: 2015-09-16
  Administered 2015-09-16: 2.5 mg via RESPIRATORY_TRACT
  Filled 2015-09-16: qty 3

## 2015-09-16 NOTE — ED Provider Notes (Signed)
CSN: [id number]     Arrival date & time 09/16/15  0047 History  By signing my name below, I, Phillis Haggis, attest that this documentation has been prepared under the direction and in the presence of Gilda Crease, MD. Electronically Signed: Phillis Haggis, ED Scribe. 09/16/2015. 1:14 AM.  Chief Complaint  Patient presents with  . Vascular Access Problem  . Shortness of Breath   The history is provided by the patient. No language interpreter was used.    HPI Comments: [name] is a 60 y.o. male with a hx of MI, HTN, Hepatitis C, ESRD on hemodialysis, CHF, and PE brought in by EMS who presents to the Emergency Department complaining of SOB onset earlier this evening. Pt states that he last had dialysis on 09/10/15. Per triage note, pt was seen here yesterday to receive hemodialysis but left because he did not want to wait. He states that he was called to the ED from dialysis for a high potassium level. Pt is requesting a breathing treatment. He denies fever or chills.    Past Medical History  Diagnosis Date  . Myocardial infarction (HCC)   . Hypertension   . GERD (gastroesophageal reflux disease)   . Pulmonary edema     Just prior to starting HD and resolved with HD. ECHO in 2008 showed normal LVEF, mild diiastolic dysfunction.   . Neuropathy (HCC)     Hx: of in toes  . Hepatitis     Hc: of Hep C  . Cataracts, both eyes     Hx: of "slightly"  . Pancreatitis     Hx: of  . CHF (congestive heart failure) (HCC)   . Diabetes mellitus     not on medication  . 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  Started HD in 2007, gets HD at [facility] on MWF schedule.     Past Surgical History  Procedure Laterality Date  . Dialysis fistula creation Left   . Cholecystectomy    . Colonoscopy      Hx: of  . Ligation of arteriovenous  fistula Left 11/06/2012    Procedure: PLICATION OF BRACHIO-CEPHALIC ARTERIOVENOUS  FISTULA;  Surgeon: Fransisco Hertz,  MD;  Location[facility] OR;  Service: Vascular;  Laterality: Left;  . Pars plana vitrectomy Right 11/10/2014    Procedure: PARS PLANA VITRECTOMY WITH 25 GAUGE;  Surgeon: Sherrie George, MD;  Location: [facility] OR;  Service: Ophthalmology;  Laterality: Right;  . Membrane peel Right 11/10/2014    Procedure: MEMBRANE PEEL;  Surgeon: Sherrie George, MD;  Location: [facility] OR;  Service: Ophthalmology;  Laterality: Right;  . Gas/fluid exchange Right 11/10/2014    Procedure: GAS/FLUID EXCHANGE;  Surgeon: Sherrie George, MD;  Location: [facility] OR;  Service: Ophthalmology;  Laterality: Right;  . Laser photo ablation Right 11/10/2014    Procedure: LASER PHOTO ABLATION;  Surgeon: Sherrie George, MD;  Location: [facility] OR;  Service: Ophthalmology;  Laterality: Right;  Endo Laser   Family History  Problem Relation Age of Onset  . Diabetes Mother    Social History  Substance Use Topics  . Smoking status: Current Every Day Smoker -- 0.35 packs/day for 43 years    Types: Cigarettes    Last Attempt to Quit: 12/16/2012  . Smokeless tobacco: Never Used  . Alcohol Use: No    Review of Systems  Constitutional: Negative for fever and chills.  Respiratory: Positive for shortness of  breath.   All other systems reviewed and are negative.  Allergies  Eggs or egg-derived products  Home Medications   Prior to Admission medications   Medication Sig Start Date End Date Taking? Authorizing Provider  amLODipine (NORVASC) 10 MG tablet Take 10 mg by mouth daily.    Historical Provider, MD  b complex-vitamin c-folic acid (NEPHRO-VITE) 0.8 MG TABS Take 0.8 mg by mouth at bedtime.    Historical Provider, MD  calcium gluconate 500 MG tablet Take 1 tablet (500 mg total) by mouth 3 (three) times daily. 09/09/15   Leta BaptistEmily Roe Nguyen, MD  carvedilol (COREG) 3.125 MG tablet Take 3.125 mg by mouth 2 (two) times daily. 01/08/15   Historical Provider, MD  FOSRENOL 1000 MG chewable tablet Chew 1,000 mg by mouth 5 (five) times daily. 01/06/15   Historical  Provider, MD  hydrALAZINE (APRESOLINE) 100 MG tablet Take 100 mg by mouth 2 (two) times daily.  11/05/14   Historical Provider, MD  lisinopril (PRINIVIL,ZESTRIL) 40 MG tablet Take 40 mg by mouth 2 (two) times daily.     Historical Provider, MD  PROAIR HFA 108 (90 BASE) MCG/ACT inhaler Inhale 1 puff into the lungs every 4 (four) hours as needed for wheezing. Wheezing 09/22/14   Historical Provider, MD   BP 104/73 mmHg  Pulse 78  Temp(Src) 97.5 F (36.4 C) (Oral)  Resp 18  Ht 6' (1.829 m)  Wt 155 lb (70.308 kg)  BMI 21.02 kg/m2  SpO2 97% Physical Exam  Constitutional: He is oriented to person, place, and time. He appears well-developed and well-nourished. No distress.  HENT:  Head: Normocephalic and atraumatic.  Right Ear: Hearing normal.  Left Ear: Hearing normal.  Nose: Nose normal.  Mouth/Throat: Oropharynx is clear and moist and mucous membranes are normal.  Eyes: Conjunctivae and EOM are normal. Pupils are equal, round, and reactive to light.  Neck: Normal range of motion. Neck supple.  Cardiovascular: Regular rhythm, S1 normal and S2 normal.  Exam reveals no gallop and no friction rub.   No murmur heard. Pulmonary/Chest: Effort normal and breath sounds normal. No respiratory distress. He exhibits no tenderness.  Abdominal: Soft. Normal appearance and bowel sounds are normal. There is no hepatosplenomegaly. There is no tenderness. There is no rebound, no guarding, no tenderness at McBurney's point and negative Murphy's sign. No hernia.  Musculoskeletal: Normal range of motion.  Neurological: He is alert and oriented to person, place, and time. He has normal strength. No cranial nerve deficit or sensory deficit. Coordination normal. GCS eye subscore is 4. GCS verbal subscore is 5. GCS motor subscore is 6.  Skin: Skin is warm, dry and intact. No rash noted. No cyanosis.  Psychiatric: He has a normal mood and affect. His speech is normal and behavior is normal. Thought content normal.   Nursing note and vitals reviewed.   ED Course  Procedures (including critical care time) DIAGNOSTIC STUDIES: Oxygen Saturation is 97% on RA, normal by my interpretation.    COORDINATION OF CARE: 1:10 AM-Discussed treatment plan which includes breathing treatment with pt at bedside and pt agreed to plan.    Labs Review Labs Reviewed  CBC - Abnormal; Notable for the following:    RBC 3.37 (*)    Hemoglobin 10.0 (*)    HCT 30.0 (*)    All other components within normal limits  BASIC METABOLIC PANEL - Abnormal; Notable for the following:    Sodium 134 (*)    Potassium 6.5 (*)    Chloride  99 (*)    CO2 14 (*)    BUN 126 (*)    Creatinine, Ser 17.43 (*)    Calcium 6.6 (*)    GFR calc non Af Amer 3 (*)    GFR calc Af Amer 3 (*)    Anion gap 21 (*)    All other components within normal limits    Imaging Review Dg Chest 2 View  09/16/2015  CLINICAL DATA:  Dialysis patient, shortness of breath. History of hypertension and hepatitis, diabetes and CHF. EXAM: CHEST  2 VIEW COMPARISON:  Chest radiograph September 10, 2015 FINDINGS: The cardiac silhouette is moderately enlarged unchanged. Similar pulmonary vascular congestion without pleural effusion or focal consolidation. No pneumothorax. LEFT subclavian vascular stent. Osseous structures are normal. IMPRESSION: Stable cardiomegaly and vascular congestion. Electronically Signed   By: Awilda Metro M.D.   On: 09/16/2015 02:07   I have personally reviewed and evaluated these images and lab results as part of my medical decision-making.   EKG Interpretation None      ED ECG REPORT   Date: 09/16/2015  Rate: 64  Rhythm: normal sinus rhythm  QRS Axis: left  Intervals: PR prolonged  ST/T Wave abnormalities: normal  Conduction Disutrbances:right bundle branch block  Narrative Interpretation:   Old EKG Reviewed: unchanged  I have personally reviewed the EKG tracing and agree with the computerized printout as noted.   MDM    Final diagnoses:  None  Hyperkalemia ESRD  Presents to the emergency department for dialysis. Patient has a history of end-stage renal disease on hemodialysis. He does not currently have a dialysis center. Patient was seen in the ER this afternoon and seemed to have hyperkalemia. Arrangements for made for him to be dialyzed, but he eloped from the ER before dialysis center was ready for him. The dialysis nurse contacted enema at home and told him he needed to come back to the ER for the dangerously elevated potassium. He arrives in the ER 9 hours after that call.  Patient reports that he is short of breath. He asked for an albuterol inhaler and this did help his breathing. Chest x-ray does not show significant volume overload. Patient is uremic with potassium of 6.5. Discussed with Dr. Marisue Humble, on call for nephrology. Patient will be held in the ER and dialyzed first thing in the morning.  I personally performed the services described in this documentation, which was scribed in my presence. The recorded information has been reviewed and is accurate.    Gilda Crease, MD 09/16/15 [phone]

## 2015-09-16 NOTE — ED Notes (Signed)
Pt brought to ED by [facility] from home to get HD today, pt were here yesterday and left because he didn't want to wait to long for getting HD. Pt states he is feeling SOB and he will like to have a breathing treatment.  VS WNL 185/98, HR 80, R 19, SPO2 97%.

## 2015-09-16 NOTE — Progress Notes (Signed)
Dialysis treatment ended with 50 minutes remaining at patient's request. AMA paper signed and witnessed. Pt discharged to self care and escorted to ED waiting room via wheelchair. VSS although BP remains high.

## 2015-09-20 ENCOUNTER — Encounter (HOSPITAL_COMMUNITY): Payer: Self-pay

## 2015-09-20 ENCOUNTER — Non-Acute Institutional Stay (HOSPITAL_COMMUNITY)
Admission: EM | Admit: 2015-09-20 | Discharge: 2015-09-20 | Disposition: A | Discharge status: Home / Self Care | Payer: Medicare Other | Attending: Emergency Medicine | Admitting: Emergency Medicine

## 2015-09-20 DIAGNOSIS — Z992 Dependence on renal dialysis: Secondary | ICD-10-CM | POA: Insufficient documentation

## 2015-09-20 DIAGNOSIS — E1122 Type 2 diabetes mellitus with diabetic chronic kidney disease: Secondary | ICD-10-CM | POA: Diagnosis not present

## 2015-09-20 DIAGNOSIS — E875 Hyperkalemia: Secondary | ICD-10-CM | POA: Diagnosis not present

## 2015-09-20 DIAGNOSIS — I132 Hypertensive heart and chronic kidney disease with heart failure and with stage 5 chronic kidney disease, or end stage renal disease: Secondary | ICD-10-CM | POA: Insufficient documentation

## 2015-09-20 DIAGNOSIS — N186 End stage renal disease: Secondary | ICD-10-CM | POA: Diagnosis not present

## 2015-09-20 DIAGNOSIS — F17210 Nicotine dependence, cigarettes, uncomplicated: Secondary | ICD-10-CM | POA: Insufficient documentation

## 2015-09-20 DIAGNOSIS — F22 Delusional disorders: Secondary | ICD-10-CM | POA: Diagnosis present

## 2015-09-20 DIAGNOSIS — E1140 Type 2 diabetes mellitus with diabetic neuropathy, unspecified: Secondary | ICD-10-CM | POA: Insufficient documentation

## 2015-09-20 DIAGNOSIS — I252 Old myocardial infarction: Secondary | ICD-10-CM | POA: Insufficient documentation

## 2015-09-20 LAB — CBC WITH DIFFERENTIAL/PLATELET
BASOS ABS: 0.1 10*3/uL (ref 0.0–0.1)
Basophils Relative: 2 %
Eosinophils Absolute: 0.9 10*3/uL — ABNORMAL HIGH (ref 0.0–0.7)
Eosinophils Relative: 13 %
HEMATOCRIT: 32.4 % — AB (ref 39.0–52.0)
Hemoglobin: 10.4 g/dL — ABNORMAL LOW (ref 13.0–17.0)
LYMPHS ABS: 0.9 10*3/uL (ref 0.7–4.0)
LYMPHS PCT: 13 %
MCH: 29.5 pg (ref 26.0–34.0)
MCHC: 32.1 g/dL (ref 30.0–36.0)
MCV: 92 fL (ref 78.0–100.0)
MONO ABS: 0.6 10*3/uL (ref 0.1–1.0)
MONOS PCT: 8 %
Neutro Abs: 4.7 10*3/uL (ref 1.7–7.7)
Neutrophils Relative %: 65 %
Platelets: 90 10*3/uL — ABNORMAL LOW (ref 150–400)
RBC: 3.52 MIL/uL — ABNORMAL LOW (ref 4.22–5.81)
RDW: 15.7 % — AB (ref 11.5–15.5)
WBC: 7.3 10*3/uL (ref 4.0–10.5)

## 2015-09-20 LAB — COMPREHENSIVE METABOLIC PANEL
ALT: 20 U/L (ref 17–63)
AST: 27 U/L (ref 15–41)
Albumin: 2.9 g/dL — ABNORMAL LOW (ref 3.5–5.0)
Alkaline Phosphatase: 112 U/L (ref 38–126)
Anion gap: 19 — ABNORMAL HIGH (ref 5–15)
BILIRUBIN TOTAL: 0.7 mg/dL (ref 0.3–1.2)
BUN: 92 mg/dL — AB (ref 6–20)
CO2: 17 mmol/L — ABNORMAL LOW (ref 22–32)
Calcium: 6.9 mg/dL — ABNORMAL LOW (ref 8.9–10.3)
Chloride: 98 mmol/L — ABNORMAL LOW (ref 101–111)
Creatinine, Ser: 14.89 mg/dL — ABNORMAL HIGH (ref 0.61–1.24)
GFR calc Af Amer: 4 mL/min — ABNORMAL LOW (ref >60)
GFR, EST NON AFRICAN AMERICAN: 3 mL/min — AB (ref >60)
Glucose, Bld: 77 mg/dL (ref 65–99)
POTASSIUM: 5.6 mmol/L — AB (ref 3.5–5.1)
Sodium: 134 mmol/L — ABNORMAL LOW (ref 135–145)
TOTAL PROTEIN: 7.2 g/dL (ref 6.5–8.1)

## 2015-09-20 MED ORDER — INSULIN ASPART 100 UNIT/ML ~~LOC~~ SOLN: 5.0000 [IU] | Freq: Once | SUBCUTANEOUS | Status: DC

## 2015-09-20 MED ORDER — ALBUTEROL SULFATE (2.5 MG/3ML) 0.083% IN NEBU
2.5000 mg | INHALATION_SOLUTION | Freq: Once | RESPIRATORY_TRACT | Status: AC
  Administered 2015-09-20: 2.5 mg via RESPIRATORY_TRACT
  Filled 2015-09-20: qty 3

## 2015-09-20 MED ORDER — LIDOCAINE-PRILOCAINE 2.5-2.5 % EX CREA: 1.0000 "application " | TOPICAL_CREAM | CUTANEOUS | Status: DC | PRN

## 2015-09-20 MED ORDER — LOPERAMIDE HCL 2 MG PO CAPS: 4.0000 mg | ORAL_CAPSULE | ORAL | Status: DC | PRN

## 2015-09-20 MED ORDER — SODIUM CHLORIDE 0.9 % IV SOLN: 100.0000 mL | INTRAVENOUS | Status: DC | PRN

## 2015-09-20 MED ORDER — ALTEPLASE 2 MG IJ SOLR: 2.0000 mg | Freq: Once | INTRAMUSCULAR | Status: DC | PRN

## 2015-09-20 MED ORDER — CLONIDINE HCL 0.2 MG PO TABS
0.3000 mg | ORAL_TABLET | Freq: Once | ORAL | Status: AC
  Administered 2015-09-20: 0.3 mg via ORAL
  Filled 2015-09-20 (×2): qty 1

## 2015-09-20 MED ORDER — DEXTROSE 50 % IV SOLN: 1.0000 | Freq: Once | INTRAVENOUS | Status: DC

## 2015-09-20 MED ORDER — HEPARIN SODIUM (PORCINE) 1000 UNIT/ML DIALYSIS: 1000.0000 [IU] | INTRAMUSCULAR | Status: DC | PRN

## 2015-09-20 MED ORDER — PENTAFLUOROPROP-TETRAFLUOROETH EX AERO: 1.0000 "application " | INHALATION_SPRAY | CUTANEOUS | Status: DC | PRN

## 2015-09-20 MED ORDER — LIDOCAINE HCL (PF) 1 % IJ SOLN: 5.0000 mL | INTRAMUSCULAR | Status: DC | PRN

## 2015-09-20 NOTE — ED Provider Notes (Signed)
CSN: [id number]     Arrival date & time 09/20/15  0865 History   First MD Initiated Contact with Patient 09/20/15 [phone]     Chief Complaint  Patient presents with  . Vascular Access Problem    Needs dialysis.      (Consider location/radiation/quality/duration/timing/severity/associated sxs/prior Treatment) HPI 60 year old male with a history of diabetes, CHF, hypertension, MI, ESRD on hemodialysis currently not receiving outpatient dialysis and had been recommended to come to the emergency department for his scheduled dialysis sessions presents with concern of needing dialysis. Last received it early Thursday. On ROS, patient also reports very mild cough and shortness of breath. This have been intermittent over months, however began this weekend. Mild. Nonproductive cough. Better with albuterol at home. Told he has hx of bronchitis. Would not have come  To hospital for cough, however is here for dialysis.    Past Medical History  Diagnosis Date  . Myocardial infarction (HCC)   . Hypertension   . GERD (gastroesophageal reflux disease)   . Pulmonary edema     Just prior to starting HD and resolved with HD. ECHO in 2008 showed normal LVEF, mild diiastolic dysfunction.   . Neuropathy (HCC)     Hx: of in toes  . Hepatitis     Hc: of Hep C  . Cataracts, both eyes     Hx: of "slightly"  . Pancreatitis     Hx: of  . CHF (congestive heart failure) (HCC)   . Diabetes mellitus     not on medication  . 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  Started HD in 2007, gets HD at [facility] on MWF schedule.     Past Surgical History  Procedure Laterality Date  . Dialysis fistula creation Left   . Cholecystectomy    . Colonoscopy      Hx: of  . Ligation of arteriovenous  fistula Left 11/06/2012    Procedure: PLICATION OF BRACHIO-CEPHALIC ARTERIOVENOUS  FISTULA;  Surgeon: Fransisco Hertz, MD;  Location: [facility] OR;  Service: Vascular;  Laterality: Left;  . Pars plana  vitrectomy Right 11/10/2014    Procedure: PARS PLANA VITRECTOMY WITH 25 GAUGE;  Surgeon: Sherrie George, MD;  Location: [facility] OR;  Service: Ophthalmology;  Laterality: Right;  . Membrane peel Right 11/10/2014    Procedure: MEMBRANE PEEL;  Surgeon: Sherrie George, MD;  Location: [facility] OR;  Service: Ophthalmology;  Laterality: Right;  . Gas/fluid exchange Right 11/10/2014    Procedure: GAS/FLUID EXCHANGE;  Surgeon: Sherrie George, MD;  Location: [facility] OR;  Service: Ophthalmology;  Laterality: Right;  . Laser photo ablation Right 11/10/2014    Procedure: LASER PHOTO ABLATION;  Surgeon: Sherrie George, MD;  Location: [facility] OR;  Service: Ophthalmology;  Laterality: Right;  Endo Laser   Family History  Problem Relation Age of Onset  . Diabetes Mother    Social History  Substance Use Topics  . Smoking status: Current Every Day Smoker -- 0.35 packs/day for 43 years    Types: Cigarettes    Last Attempt to Quit: 12/16/2012  . Smokeless tobacco: Never Used  . Alcohol Use: No    Review of Systems  Constitutional: Negative for fever.  HENT: Negative for sore throat.   Eyes: Negative for visual disturbance.  Respiratory: Positive for cough and shortness of breath.   Cardiovascular: Positive for leg swelling. Negative for chest pain.  Gastrointestinal: Positive for diarrhea. Negative for nausea, vomiting  and abdominal pain.  Genitourinary: Negative for difficulty urinating.  Musculoskeletal: Negative for back pain and neck stiffness.  Skin: Negative for rash.  Neurological: Negative for syncope and headaches.      Allergies  Eggs or egg-derived products  Home Medications   Prior to Admission medications   Medication Sig Start Date End Date Taking? Authorizing Provider  amLODipine (NORVASC) 10 MG tablet Take 10 mg by mouth daily.   Yes Historical Provider, MD  b complex-vitamin c-folic acid (NEPHRO-VITE) 0.8 MG TABS Take 0.8 mg by mouth at bedtime.   Yes Historical Provider, MD  calcium gluconate  500 MG tablet Take 1 tablet (500 mg total) by mouth 3 (three) times daily. 09/09/15  Yes Leta BaptistEmily Roe Nguyen, MD  carvedilol (COREG) 3.125 MG tablet Take 3.125 mg by mouth 2 (two) times daily. 01/08/15  Yes Historical Provider, MD  FOSRENOL 1000 MG chewable tablet Chew 1,000 mg by mouth 5 (five) times daily. 01/06/15  Yes Historical Provider, MD  hydrALAZINE (APRESOLINE) 100 MG tablet Take 100 mg by mouth 2 (two) times daily.  11/05/14  Yes Historical Provider, MD  lisinopril (PRINIVIL,ZESTRIL) 40 MG tablet Take 40 mg by mouth 2 (two) times daily.    Yes Historical Provider, MD  PROAIR HFA 108 (90 BASE) MCG/ACT inhaler Inhale 1 puff into the lungs every 4 (four) hours as needed for wheezing. Wheezing 09/22/14  Yes Historical Provider, MD   BP 185/90 mmHg  Pulse 76  Temp(Src) 97.4 F (36.3 C) (Oral)  Resp 19  Wt 167 lb 15.9 oz (76.2 kg)  SpO2 100% Physical Exam  Constitutional: He is oriented to person, place, and time. He appears well-developed and well-nourished. No distress.  HENT:  Head: Normocephalic and atraumatic.  Eyes: Conjunctivae and EOM are normal.  Neck: Normal range of motion.  Cardiovascular: Normal rate, regular rhythm, normal heart sounds and intact distal pulses.  Exam reveals no gallop and no friction rub.   No murmur heard. Pulmonary/Chest: Effort normal and breath sounds normal. No respiratory distress. He has no wheezes. He has no rales.  Abdominal: Soft. He exhibits no distension. There is no tenderness. There is no guarding.  Musculoskeletal: He exhibits edema (trace bilateral).  Neurological: He is alert and oriented to person, place, and time.  Skin: Skin is warm and dry. He is not diaphoretic.  Nursing note and vitals reviewed.   ED Course  Procedures (including critical care time) Labs Review Labs Reviewed  CBC WITH DIFFERENTIAL/PLATELET - Abnormal; Notable for the following:    RBC 3.52 (*)    Hemoglobin 10.4 (*)    HCT 32.4 (*)    RDW 15.7 (*)    Platelets  90 (*)    Eosinophils Absolute 0.9 (*)    All other components within normal limits  COMPREHENSIVE METABOLIC PANEL - Abnormal; Notable for the following:    Sodium 134 (*)    Potassium 5.6 (*)    Chloride 98 (*)    CO2 17 (*)    BUN 92 (*)    Creatinine, Ser 14.89 (*)    Calcium 6.9 (*)    Albumin 2.9 (*)    GFR calc non Af Amer 3 (*)    GFR calc Af Amer 4 (*)    Anion gap 19 (*)    All other components within normal limits    Imaging Review No results found. I have personally reviewed and evaluated these images and lab results as part of my medical decision-making.   EKG Interpretation  Date/Time:  Monday Sep 20 2015 07:20:59 EDT Ventricular Rate:  77 PR Interval:  234 QRS Duration: 159 QT Interval:  476 QTC Calculation: 539 R Axis:   -62 Text Interpretation:  Sinus rhythm Prolonged PR interval Right bundle  branch block Inferior infarct, old No significant change since last  tracing Confirmed by [facility] MD, Isabellah Sobocinski ([id number]) on 09/20/2015 7:49:06 AM      MDM   Final diagnoses:  ESRD (end stage renal disease) (HCC)  Hyperkalemia  Dialysis patient [facility])   60 year old male with a history of diabetes, CHF, hypertension, MI, ESRD on hemodialysis currently not receiving outpatient dialysis and had been recommended to come to the emergency department for his scheduled dialysis sessions presents with concern of needing dialysis. Last received it early Thursday. On ROS, patient also reports very mild cough and shortness of breath. He is afebrile, well-appearing, equal breath sounds bilaterally, and reports these symptoms are not unlike the mild symptoms he has had on and off for some time and is requesting albuterol which makes him feel better at home.  Suspect viral etiology of symptoms and dyspnea likely secondary to needing dialysis. Do not feel we need XR at this time, however discussed with patient that if symptoms worsen he would need this for evaluation for pneumonia.  Pt  reports mild diarrhea that he associates with needing dialysis. No abd pain or fevers. Requesting immodium.   Called Nephrology for dialysis. Potassium 5.6. No changes on ECG with chronic RBBB.  Nephrology to take pt to dialysis.  Alvira Monday, MD 09/20/15 [phone]

## 2015-09-20 NOTE — ED Notes (Signed)
Pt is cleared to eat, per Shanda BumpsJessica, RN. Gave pt a Malaw[place]urkey sandwich and 3 juices.

## 2015-09-20 NOTE — ED Notes (Signed)
Renal lunch tray ordered @ 1321. Spoke w/ Lockie ParesJaclyn.

## 2015-09-20 NOTE — ED Notes (Signed)
Per pt he came today for dialysis. Pts last dialysis was "Wednesday or Thursday". Pt states that he feels like his ankles and face are swollen, he also feels congested.

## 2015-09-20 NOTE — ED Notes (Signed)
Pt states he is here to receive hemodialysis. Also reports dry cough and SOB, intermittent for several weeks. Respirations unlabored. Pt reports swelling to bilateral legs and face.

## 2015-09-20 NOTE — ED Notes (Signed)
Pt presents to ER from home with [facility] for needing dialysis (MWF); pt reports missing fridays treatment session

## 2015-09-20 NOTE — ED Notes (Signed)
Pt eating breakfast at the time.  

## 2015-09-20 NOTE — Progress Notes (Signed)
Patient b/p was elevated prior to treatment and remained elevated during treatment. Patient states he had been [facility] and he has not taken his meds since he was released x 1 week ago due to they did not return his medication to him.  Clonidine 0.3 given per dr. Hyman HopesWebb patient refused to wait to recheck b/p. Discharged home with belongings. Last b/p 187/74.

## 2015-09-20 NOTE — ED Notes (Signed)
Pt resting quietly at the time. Vital signs stable. No signs of distress noted. 

## 2015-09-22 ENCOUNTER — Encounter (HOSPITAL_COMMUNITY): Payer: Self-pay

## 2015-09-22 ENCOUNTER — Emergency Department (HOSPITAL_COMMUNITY)
Admission: EM | Admit: 2015-09-22 | Discharge: 2015-09-22 | Disposition: A | Discharge status: Home / Self Care | Payer: Medicare Other | Attending: Emergency Medicine | Admitting: Emergency Medicine

## 2015-09-22 DIAGNOSIS — Z992 Dependence on renal dialysis: Secondary | ICD-10-CM | POA: Diagnosis not present

## 2015-09-22 DIAGNOSIS — I252 Old myocardial infarction: Secondary | ICD-10-CM | POA: Diagnosis not present

## 2015-09-22 DIAGNOSIS — Z79899 Other long term (current) drug therapy: Secondary | ICD-10-CM | POA: Diagnosis not present

## 2015-09-22 DIAGNOSIS — R05 Cough: Secondary | ICD-10-CM | POA: Diagnosis not present

## 2015-09-22 DIAGNOSIS — Z8709 Personal history of other diseases of the respiratory system: Secondary | ICD-10-CM | POA: Diagnosis not present

## 2015-09-22 DIAGNOSIS — I509 Heart failure, unspecified: Secondary | ICD-10-CM | POA: Diagnosis not present

## 2015-09-22 DIAGNOSIS — I120 Hypertensive chronic kidney disease with stage 5 chronic kidney disease or end stage renal disease: Secondary | ICD-10-CM | POA: Diagnosis present

## 2015-09-22 DIAGNOSIS — Z8719 Personal history of other diseases of the digestive system: Secondary | ICD-10-CM | POA: Insufficient documentation

## 2015-09-22 DIAGNOSIS — F17210 Nicotine dependence, cigarettes, uncomplicated: Secondary | ICD-10-CM | POA: Insufficient documentation

## 2015-09-22 DIAGNOSIS — H269 Unspecified cataract: Secondary | ICD-10-CM | POA: Diagnosis not present

## 2015-09-22 DIAGNOSIS — R197 Diarrhea, unspecified: Secondary | ICD-10-CM | POA: Diagnosis not present

## 2015-09-22 DIAGNOSIS — E1122 Type 2 diabetes mellitus with diabetic chronic kidney disease: Secondary | ICD-10-CM | POA: Diagnosis not present

## 2015-09-22 DIAGNOSIS — N186 End stage renal disease: Secondary | ICD-10-CM | POA: Diagnosis not present

## 2015-09-22 LAB — CBC WITH DIFFERENTIAL/PLATELET
BASOS ABS: 0.1 10*3/uL (ref 0.0–0.1)
Basophils Relative: 1 %
EOS PCT: 16 %
Eosinophils Absolute: 0.9 10*3/uL — ABNORMAL HIGH (ref 0.0–0.7)
HCT: 28.2 % — ABNORMAL LOW (ref 39.0–52.0)
Hemoglobin: 8.9 g/dL — ABNORMAL LOW (ref 13.0–17.0)
Lymphocytes Relative: 16 %
Lymphs Abs: 1 10*3/uL (ref 0.7–4.0)
MCH: 29 pg (ref 26.0–34.0)
MCHC: 31.6 g/dL (ref 30.0–36.0)
MCV: 91.9 fL (ref 78.0–100.0)
MONO ABS: 0.5 10*3/uL (ref 0.1–1.0)
Monocytes Relative: 9 %
Neutro Abs: 3.4 10*3/uL (ref 1.7–7.7)
Neutrophils Relative %: 58 %
PLATELETS: 66 10*3/uL — AB (ref 150–400)
RBC: 3.07 MIL/uL — ABNORMAL LOW (ref 4.22–5.81)
RDW: 15.6 % — AB (ref 11.5–15.5)
WBC: 5.9 10*3/uL (ref 4.0–10.5)

## 2015-09-22 LAB — BASIC METABOLIC PANEL
ANION GAP: 16 — AB (ref 5–15)
BUN: 55 mg/dL — ABNORMAL HIGH (ref 6–20)
CALCIUM: 6.2 mg/dL — AB (ref 8.9–10.3)
CO2: 23 mmol/L (ref 22–32)
CREATININE: 10.7 mg/dL — AB (ref 0.61–1.24)
Chloride: 99 mmol/L — ABNORMAL LOW (ref 101–111)
GFR calc Af Amer: 5 mL/min — ABNORMAL LOW (ref >60)
GFR, EST NON AFRICAN AMERICAN: 5 mL/min — AB (ref >60)
GLUCOSE: 119 mg/dL — AB (ref 65–99)
Potassium: 4.3 mmol/L (ref 3.5–5.1)
Sodium: 138 mmol/L (ref 135–145)

## 2015-09-22 NOTE — ED Provider Notes (Signed)
CSN: [id number]     Arrival date & time 09/22/15  0033 History  By signing my name below, I, [name], attest that this documentation has been prepared under the direction and in the presence of Jerome Crumble, MD . Electronically Signed: Freida Branch, Scribe. 09/22/2015. 3:48 AM    Chief Complaint  Patient presents with  . Needs dialysis    The history is provided by the patient. No language interpreter was used.   HPI Comments:  [name] is a 60 y.o. male with a history of ESRD, MWF dialysis who presents to the Emergency Department to the ED to receive dialysis. At this time he complaining of cough x 3 days and diarrhea. He denies fever. No alleviating factors noted. Pt was last dialyzed on 09/20/15. Pt has no other complaints or symptoms at this time. Pt is presenting note from nephrologist that states he should be dialyzed regardless of potassium level.   Past Medical History  Diagnosis Date  . Myocardial infarction (HCC)   . Hypertension   . GERD (gastroesophageal reflux disease)   . Pulmonary edema     Just prior to starting HD and resolved with HD. ECHO in 2008 showed normal LVEF, mild diiastolic dysfunction.   . Neuropathy (HCC)     Hx: of in toes  . Hepatitis     Hc: of Hep C  . Cataracts, both eyes     Hx: of "slightly"  . Pancreatitis     Hx: of  . CHF (congestive heart failure) (HCC)   . Diabetes mellitus     not on medication  . 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  Started HD in 2007, gets HD at [facility] on MWF schedule.     Past Surgical History  Procedure Laterality Date  . Dialysis fistula creation Left   . Cholecystectomy    . Colonoscopy      Hx: of  . Ligation of arteriovenous  fistula Left 11/06/2012    Procedure: PLICATION OF BRACHIO-CEPHALIC ARTERIOVENOUS  FISTULA;  Surgeon: Fransisco Hertz, MD;  Location: [facility] OR;  Service: Vascular;  Laterality: Left;  . Pars plana vitrectomy Right 11/10/2014    Procedure:  PARS PLANA VITRECTOMY WITH 25 GAUGE;  Surgeon: Sherrie George, MD;  Location: [facility] OR;  Service: Ophthalmology;  Laterality: Right;  . Membrane peel Right 11/10/2014    Procedure: MEMBRANE PEEL;  Surgeon: Sherrie George, MD;  Location: [facility] OR;  Service: Ophthalmology;  Laterality: Right;  . Gas/fluid exchange Right 11/10/2014    Procedure: GAS/FLUID EXCHANGE;  Surgeon: Sherrie George, MD;  Location: [facility] OR;  Service: Ophthalmology;  Laterality: Right;  . Laser photo ablation Right 11/10/2014    Procedure: LASER PHOTO ABLATION;  Surgeon: Sherrie George, MD;  Location: [facility] OR;  Service: Ophthalmology;  Laterality: Right;  Endo Laser   Family History  Problem Relation Age of Onset  . Diabetes Mother    Social History  Substance Use Topics  . Smoking status: Current Every Day Smoker -- 0.35 packs/day for 43 years    Types: Cigarettes    Last Attempt to Quit: 12/16/2012  . Smokeless tobacco: Never Used  . Alcohol Use: No    Review of Systems  10 systems reviewed and all are negative for acute change except as noted in the HPI.  Allergies  Eggs or egg-derived products  Home Medications   Prior to Admission medications  Medication Sig Start Date End Date Taking? Authorizing Provider  amLODipine (NORVASC) 10 MG tablet Take 10 mg by mouth daily.   Yes Historical Provider, MD  b complex-vitamin c-folic acid (NEPHRO-VITE) 0.8 MG TABS Take 0.8 mg by mouth at bedtime.   Yes Historical Provider, MD  calcium gluconate 500 MG tablet Take 1 tablet (500 mg total) by mouth 3 (three) times daily. 09/09/15  Yes Leta BaptistEmily Roe Nguyen, MD  carvedilol (COREG) 3.125 MG tablet Take 3.125 mg by mouth 2 (two) times daily. 01/08/15  Yes Historical Provider, MD  FOSRENOL 1000 MG chewable tablet Chew 1,000 mg by mouth 5 (five) times daily. 01/06/15  Yes Historical Provider, MD  hydrALAZINE (APRESOLINE) 100 MG tablet Take 100 mg by mouth 2 (two) times daily.  11/05/14  Yes Historical Provider, MD  lisinopril  (PRINIVIL,ZESTRIL) 40 MG tablet Take 40 mg by mouth 2 (two) times daily.    Yes Historical Provider, MD  PROAIR HFA 108 (90 BASE) MCG/ACT inhaler Inhale 1 puff into the lungs every 4 (four) hours as needed for wheezing. Wheezing 09/22/14  Yes Historical Provider, MD   BP 185/94 mmHg  Pulse 76  Temp(Src) 98.1 F (36.7 C) (Oral)  Resp 18  SpO2 98% Physical Exam  Constitutional: He is oriented to person, place, and time. Vital signs are normal. He appears well-developed and well-nourished.  Non-toxic appearance. He does not appear ill. No distress.  HENT:  Head: Normocephalic and atraumatic.  Nose: Nose normal.  Mouth/Throat: Oropharynx is clear and moist. No oropharyngeal exudate.  Eyes: Conjunctivae and EOM are normal. Pupils are equal, round, and reactive to light. No scleral icterus.  Neck: Normal range of motion. Neck supple. No tracheal deviation, no edema, no erythema and normal range of motion present. No thyroid mass and no thyromegaly present.  Cardiovascular: Normal rate, regular rhythm, S1 normal, S2 normal, normal heart sounds, intact distal pulses and normal pulses.  Exam reveals no gallop and no friction rub.   No murmur heard. Pulmonary/Chest: Effort normal and breath sounds normal. No respiratory distress. He has no wheezes. He has no rhonchi. He has no rales.  Abdominal: Soft. Normal appearance and bowel sounds are normal. He exhibits no distension, no ascites and no mass. There is no hepatosplenomegaly. There is no tenderness. There is no rebound, no guarding and no CVA tenderness.  Musculoskeletal: Normal range of motion. He exhibits no edema or tenderness.  RUE AV fistula with palpable thrill  Lymphadenopathy:    He has no cervical adenopathy.  Neurological: He is alert and oriented to person, place, and time. He has normal strength. No cranial nerve deficit or sensory deficit.  Skin: Skin is warm, dry and intact. No petechiae and no rash noted. He is not diaphoretic. No  erythema. No pallor.  Psychiatric: He has a normal mood and affect. His behavior is normal. Judgment normal.  Nursing note and vitals reviewed.   ED Course  Procedures   DIAGNOSTIC STUDIES:  Oxygen Saturation is 98% on RA, normal by my interpretation.    COORDINATION OF CARE:  2:40 AM Discussed treatment plan with pt at bedside and pt agreed to plan.  3:18 AM Discussed case with Nephrology on call  Labs Review Labs Reviewed  CBC WITH DIFFERENTIAL/PLATELET - Abnormal; Notable for the following:    RBC 3.07 (*)    Hemoglobin 8.9 (*)    HCT 28.2 (*)    RDW 15.6 (*)    Platelets 66 (*)    Eosinophils Absolute 0.9 (*)  All other components within normal limits  BASIC METABOLIC PANEL - Abnormal; Notable for the following:    Chloride 99 (*)    Glucose, Bld 119 (*)    BUN 55 (*)    Creatinine, Ser 10.70 (*)    Calcium 6.2 (*)    GFR calc non Af Amer 5 (*)    GFR calc Af Amer 5 (*)    Anion gap 16 (*)    All other components within normal limits    Imaging Review No results found. I have personally reviewed and evaluated these images and lab results as part of my medical decision-making.   EKG Interpretation   Date/Time:  Wednesday Sep 22 2015 00:44:59 EDT Ventricular Rate:  71 PR Interval:  218 QRS Duration: 154 QT Interval:  500 QTC Calculation: 543 R Axis:   -52 Text Interpretation:  Normal sinus rhythm with 1st degree A-V block  Premature atrial complexes Left axis deviation Right bundle branch block  No significant change since last tracing Confirmed by Erroll Luna [phone]) on 09/22/2015 1:51:29 AM      MDM   Final diagnoses:  None   Patient presents to the ED for dialysis.  He has no emergent need.  He has no SOB, hypoxia and K today is 4.3.  I discussed this letter he has with nephrology on call who state it is not valid.  If potassium is normal he is safe for DC and follow up.  This was relayed to the patient and he was in agreement with  the plan.  I am unsure of where he got the letter from saying he should get dialysis in the ED every time he presents.  He appears well and in NAD.  VS remain within his normal limits and he is safe for DC.     I personally performed the services described in this documentation, which was scribed in my presence. The recorded information has been reviewed and is accurate.      Jerome Crumble, MD 09/22/15 [phone]

## 2015-09-22 NOTE — ED Notes (Signed)
Pt here via [facility] for dialysis today. Pt reports shortness of breath/cough, rr-18, lungs sound clear. NAD. BP-184/110, HR-84 regular, CBG-267

## 2015-09-22 NOTE — ED Notes (Signed)
Last dialysis was Monday, needs dialysis today

## 2015-09-22 NOTE — Discharge Instructions (Signed)
End-Stage Kidney Disease [name], your blood work today was normal for a dialysis patient.  See your primary care doctor within 3 days for close follow up. If symptoms worsen, come back to the ED immediately. Thank you. The kidneys are two organs that lie on either side of the spine between the middle of the back and the front of the abdomen. The kidneys:   Remove wastes and extra water from the blood.   Produce important hormones. These help keep bones strong, regulate blood pressure, and help create red blood cells.   Balance the fluids and chemicals in the blood and tissues. End-stage kidney disease occurs when the kidneys are so damaged that they cannot do their job. When the kidneys cannot do their job, life-threatening problems occur. The body cannot stay clean and strong without the help of the kidneys. In end-stage kidney disease, the kidneys cannot get better.You need a new kidney or treatments to do some of the work healthy kidneys do in order to stay alive. CAUSES  End-stage kidney disease usually occurs when a long-lasting (chronic) kidney disease gets worse. It may also occur after the kidneys are suddenly damaged (acute kidney injury).  SYMPTOMS   Swelling (edema) of the legs, ankles, or feet.   Tiredness (lethargy).   Nausea or vomiting.   Confusion.   Problems with urination, such as:   Decreased urine production.   Frequent urination, especially at night.   Frequent accidents in children who are potty trained.   Muscle twitches and cramps.   Persistent itchiness.   Loss of appetite.   Headaches.   Abnormally dark or light skin.   Numbness in the hands or feet.   Easy bruising.   Frequent hiccups.   Menstruation stops. DIAGNOSIS  Your health care provider will measure your blood pressure and take some tests. These may include:   Urine tests.   Blood tests.   Imaging tests, such as:   An ultrasound exam.   Computed  tomography (CT).  A kidney biopsy. TREATMENT  There are two treatments for end-stage kidney disease:   A procedure that removes toxic wastes from the body (dialysis).   Receiving a new kidney (kidney transplant). Both of these treatments have serious risks and consequences. Your health care provider will help you determine which treatment is best for you based on your health, age, and other factors. In addition to having dialysis or a kidney transplant, you may need to take medicines to control high blood pressure (hypertension) and cholesterol and to decrease phosphorus levels in your blood.  HOME CARE INSTRUCTIONS  Follow your prescribed diet.   Take medicines only as directed by your health care provider.   Do not take any new medicines (prescription, over-the-counter, or nutritional supplements) unless approved by your health care provider. Many medicines can worsen your kidney damage or need to have the dose adjusted.   Keep all follow-up visits as directed by your health care provider. MAKE SURE YOU:  Understand these instructions.  Will watch your condition.  Will get help right away if you are not doing well or get worse.   This information is not intended to replace advice given to you by your health care provider. Make sure you discuss any questions you have with your health care provider.   Document Released: 07/22/2003 Document Revised: 05/22/2014 Document Reviewed: 12/29/2011 Elsevier Interactive Patient Education [employer]2016 Elsevier Inc.

## 2015-09-22 NOTE — ED Notes (Signed)
Attempted to discharge pt however he stated he was going to allow vitals or sign any paperwork until I "fed him."  Pt was angry and began calling RN names.  Security was called and escorted pt from the building.

## 2015-09-24 ENCOUNTER — Emergency Department (HOSPITAL_COMMUNITY)
Admission: EM | Admit: 2015-09-24 | Discharge: 2015-09-24 | Disposition: A | Discharge status: Home / Self Care | Payer: Medicare Other | Attending: Emergency Medicine | Admitting: Emergency Medicine

## 2015-09-24 ENCOUNTER — Encounter (HOSPITAL_COMMUNITY): Payer: Self-pay | Admitting: Emergency Medicine

## 2015-09-24 DIAGNOSIS — I509 Heart failure, unspecified: Secondary | ICD-10-CM | POA: Insufficient documentation

## 2015-09-24 DIAGNOSIS — Z8669 Personal history of other diseases of the nervous system and sense organs: Secondary | ICD-10-CM | POA: Insufficient documentation

## 2015-09-24 DIAGNOSIS — R454 Irritability and anger: Secondary | ICD-10-CM | POA: Insufficient documentation

## 2015-09-24 DIAGNOSIS — Z79899 Other long term (current) drug therapy: Secondary | ICD-10-CM | POA: Diagnosis not present

## 2015-09-24 DIAGNOSIS — I120 Hypertensive chronic kidney disease with stage 5 chronic kidney disease or end stage renal disease: Secondary | ICD-10-CM | POA: Diagnosis present

## 2015-09-24 DIAGNOSIS — E1122 Type 2 diabetes mellitus with diabetic chronic kidney disease: Secondary | ICD-10-CM | POA: Diagnosis not present

## 2015-09-24 DIAGNOSIS — K219 Gastro-esophageal reflux disease without esophagitis: Secondary | ICD-10-CM | POA: Diagnosis not present

## 2015-09-24 DIAGNOSIS — F17210 Nicotine dependence, cigarettes, uncomplicated: Secondary | ICD-10-CM | POA: Insufficient documentation

## 2015-09-24 DIAGNOSIS — R197 Diarrhea, unspecified: Secondary | ICD-10-CM | POA: Diagnosis not present

## 2015-09-24 DIAGNOSIS — R011 Cardiac murmur, unspecified: Secondary | ICD-10-CM | POA: Diagnosis not present

## 2015-09-24 DIAGNOSIS — N186 End stage renal disease: Secondary | ICD-10-CM | POA: Diagnosis not present

## 2015-09-24 DIAGNOSIS — Z992 Dependence on renal dialysis: Secondary | ICD-10-CM | POA: Diagnosis not present

## 2015-09-24 DIAGNOSIS — I252 Old myocardial infarction: Secondary | ICD-10-CM | POA: Insufficient documentation

## 2015-09-24 LAB — CBC WITH DIFFERENTIAL/PLATELET
BASOS ABS: 0.1 10*3/uL (ref 0.0–0.1)
Basophils Relative: 1 %
EOS ABS: 1.1 10*3/uL — AB (ref 0.0–0.7)
Eosinophils Relative: 14 %
HCT: 28.9 % — ABNORMAL LOW (ref 39.0–52.0)
HEMOGLOBIN: 9.3 g/dL — AB (ref 13.0–17.0)
LYMPHS ABS: 1 10*3/uL (ref 0.7–4.0)
Lymphocytes Relative: 13 %
MCH: 29 pg (ref 26.0–34.0)
MCHC: 32.2 g/dL (ref 30.0–36.0)
MCV: 90 fL (ref 78.0–100.0)
Monocytes Absolute: 0.7 10*3/uL (ref 0.1–1.0)
Monocytes Relative: 10 %
NEUTROS PCT: 62 %
Neutro Abs: 4.8 10*3/uL (ref 1.7–7.7)
Platelets: 90 10*3/uL — ABNORMAL LOW (ref 150–400)
RBC: 3.21 MIL/uL — AB (ref 4.22–5.81)
RDW: 15.2 % (ref 11.5–15.5)
WBC: 7.7 10*3/uL (ref 4.0–10.5)

## 2015-09-24 LAB — BASIC METABOLIC PANEL
ANION GAP: 19 — AB (ref 5–15)
BUN: 80 mg/dL — AB (ref 6–20)
CHLORIDE: 96 mmol/L — AB (ref 101–111)
CO2: 20 mmol/L — ABNORMAL LOW (ref 22–32)
Calcium: 7 mg/dL — ABNORMAL LOW (ref 8.9–10.3)
Creatinine, Ser: 14.56 mg/dL — ABNORMAL HIGH (ref 0.61–1.24)
GFR, EST AFRICAN AMERICAN: 4 mL/min — AB (ref >60)
GFR, EST NON AFRICAN AMERICAN: 3 mL/min — AB (ref >60)
Glucose, Bld: 74 mg/dL (ref 65–99)
POTASSIUM: 5.4 mmol/L — AB (ref 3.5–5.1)
SODIUM: 135 mmol/L (ref 135–145)

## 2015-09-24 MED ORDER — LOPERAMIDE HCL 2 MG PO CAPS
2.0000 mg | ORAL_CAPSULE | Freq: Once | ORAL | Status: AC
  Administered 2015-09-24: 2 mg via ORAL
  Filled 2015-09-24: qty 1

## 2015-09-24 MED ORDER — CLONIDINE HCL 0.2 MG PO TABS
0.3000 mg | ORAL_TABLET | Freq: Once | ORAL | Status: AC
  Administered 2015-09-24: 0.3 mg via ORAL
  Filled 2015-09-24: qty 1

## 2015-09-24 MED ORDER — CALCITRIOL 0.5 MCG PO CAPS
0.5000 ug | ORAL_CAPSULE | Freq: Every day | ORAL | Status: DC
  Administered 2015-09-24: 0.5 ug via ORAL
  Filled 2015-09-24: qty 1

## 2015-09-24 NOTE — ED Notes (Addendum)
Pt. arrived with EMS from street ( homeless ) requesting hemodialysis , pt. stated he missed 3 dialysis treatment - last dialysis was last Wednesday . Respirations unlabored / denies fever or chills. Hypertensive at triage .

## 2015-09-24 NOTE — ED Provider Notes (Signed)
CSN: [id number]     Arrival date & time 09/24/15  0126 History   First MD Initiated Contact with Patient 09/24/15 0305     No chief complaint on file.    (Consider location/radiation/quality/duration/timing/severity/associated sxs/prior Treatment) HPI Comments: The patient who is well known to the emergency department presents with concern that he needs dialysis. He states that the last time he dialyzed was Monday of this week. He reports he is not allowed to return to the dialysis center where he had been going and so he comes to the emergency department. He denies SOB, fever, vomiting. He has been having diarrhea.   The history is provided by the patient. No language interpreter was used.    Past Medical History  Diagnosis Date  . Myocardial infarction (HCC)   . Hypertension   . GERD (gastroesophageal reflux disease)   . Pulmonary edema     Just prior to starting HD and resolved with HD. ECHO in 2008 showed normal LVEF, mild diiastolic dysfunction.   . Neuropathy (HCC)     Hx: of in toes  . Hepatitis     Hc: of Hep C  . Cataracts, both eyes     Hx: of "slightly"  . Pancreatitis     Hx: of  . CHF (congestive heart failure) (HCC)   . Diabetes mellitus     not on medication  . 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  Started HD in 2007, gets HD at [facility] on MWF schedule.     Past Surgical History  Procedure Laterality Date  . Dialysis fistula creation Left   . Cholecystectomy    . Colonoscopy      Hx: of  . Ligation of arteriovenous  fistula Left 11/06/2012    Procedure: PLICATION OF BRACHIO-CEPHALIC ARTERIOVENOUS  FISTULA;  Surgeon: Fransisco Hertz, MD;  Location: [facility] OR;  Service: Vascular;  Laterality: Left;  . Pars plana vitrectomy Right 11/10/2014    Procedure: PARS PLANA VITRECTOMY WITH 25 GAUGE;  Surgeon: Sherrie George, MD;  Location: [facility] OR;  Service: Ophthalmology;  Laterality: Right;  . Membrane peel Right 11/10/2014    Procedure:  MEMBRANE PEEL;  Surgeon: Sherrie George, MD;  Location: [facility] OR;  Service: Ophthalmology;  Laterality: Right;  . Gas/fluid exchange Right 11/10/2014    Procedure: GAS/FLUID EXCHANGE;  Surgeon: Sherrie George, MD;  Location: [facility] OR;  Service: Ophthalmology;  Laterality: Right;  . Laser photo ablation Right 11/10/2014    Procedure: LASER PHOTO ABLATION;  Surgeon: Sherrie George, MD;  Location: [facility] OR;  Service: Ophthalmology;  Laterality: Right;  Endo Laser   Family History  Problem Relation Age of Onset  . Diabetes Mother    Social History  Substance Use Topics  . Smoking status: Current Every Day Smoker -- 0.35 packs/day for 43 years    Types: Cigarettes    Last Attempt to Quit: 12/16/2012  . Smokeless tobacco: Never Used  . Alcohol Use: No    Review of Systems  Constitutional: Negative for fever and chills.  HENT: Negative.   Respiratory: Negative.  Negative for cough and shortness of breath.   Cardiovascular: Negative.   Gastrointestinal: Positive for diarrhea.  Musculoskeletal: Negative.  Negative for myalgias.  Skin: Negative.   Neurological: Negative.       Allergies  Eggs or egg-derived products  Home Medications   Prior to Admission medications   Medication Sig Start Date End  Date Taking? Authorizing Provider  amLODipine (NORVASC) 10 MG tablet Take 10 mg by mouth daily.   Yes Historical Provider, MD  b complex-vitamin c-folic acid (NEPHRO-VITE) 0.8 MG TABS Take 0.8 mg by mouth at bedtime.   Yes Historical Provider, MD  calcium gluconate 500 MG tablet Take 1 tablet (500 mg total) by mouth 3 (three) times daily. 09/09/15  Yes Leta Baptist, MD  carvedilol (COREG) 3.125 MG tablet Take 3.125 mg by mouth 2 (two) times daily. 01/08/15  Yes Historical Provider, MD  FOSRENOL 1000 MG chewable tablet Chew 1,000 mg by mouth 5 (five) times daily. 01/06/15  Yes Historical Provider, MD  hydrALAZINE (APRESOLINE) 100 MG tablet Take 100 mg by mouth 2 (two) times daily.  11/05/14  Yes  Historical Provider, MD  lisinopril (PRINIVIL,ZESTRIL) 40 MG tablet Take 40 mg by mouth 2 (two) times daily.    Yes Historical Provider, MD  PROAIR HFA 108 (90 BASE) MCG/ACT inhaler Inhale 1 puff into the lungs every 4 (four) hours as needed for wheezing. Wheezing 09/22/14  Yes Historical Provider, MD   BP 197/74 mmHg  Pulse 74  Temp(Src) 98.2 F (36.8 C) (Oral)  Resp 16  SpO2 98% Physical Exam  Constitutional: He is oriented to person, place, and time. He appears well-developed and well-nourished.  HENT:  Head: Normocephalic.  Neck: Normal range of motion. Neck supple.  Cardiovascular: Normal rate and regular rhythm.   Murmur heard. Pulmonary/Chest: Effort normal and breath sounds normal. He has no wheezes. He has no rales.  Abdominal: Soft. Bowel sounds are normal. There is no tenderness. There is no rebound and no guarding.  Musculoskeletal: Normal range of motion.  Neurological: He is alert and oriented to person, place, and time.  Skin: Skin is warm and dry. No rash noted.  Psychiatric: His affect is angry.    ED Course  Procedures (including critical care time) Labs Review Labs Reviewed  CBC WITH DIFFERENTIAL/PLATELET - Abnormal; Notable for the following:    RBC 3.21 (*)    Hemoglobin 9.3 (*)    HCT 28.9 (*)    Platelets 90 (*)    Eosinophils Absolute 1.1 (*)    All other components within normal limits  BASIC METABOLIC PANEL - Abnormal; Notable for the following:    Potassium 5.4 (*)    Chloride 96 (*)    CO2 20 (*)    BUN 80 (*)    Creatinine, Ser 14.56 (*)    Calcium 7.0 (*)    GFR calc non Af Amer 3 (*)    GFR calc Af Amer 4 (*)    Anion gap 19 (*)    All other components within normal limits    Imaging Review No results found. I have personally reviewed and evaluated these images and lab results as part of my medical decision-making.   EKG Interpretation None      MDM   Final diagnoses:  None    1. ESRD-HD 2. Hyperkalemia  The patient has  not dialyzed in 5 days and has no home dialysis center available to him. He presents with potassium 5.4, up from 4.3 2 days ago. No SOB. Concern that though potassium is only slightly elevated and there are no signs of fluid overload that this patient needs regular treatment and will continue to decline without dialysis. Nephrology paged to arrange treatment in hospital.   Discussed with Dr. Arrie Aran who advises that the dialysis team has several patients waiting and that it would be later  this morning before treatment would be available. Will keep the patient in the ED until he can be taken for treatment. Patient updated on plan of care.   7:20 - patient still waiting for dialysis. Dr. Carmell AustriaNate Pickering made aware of patient status and plan.  Elpidio AnisShari Cassandr Cederberg, PA-C 09/24/15 [id number]  Shon Batonourtney F Horton, MD 09/24/15 2350

## 2015-09-24 NOTE — Progress Notes (Signed)
Pt tolerated the HD procedure well with stable VS. Pt was discharged home ambulatory and not in any acute distress noted.

## 2015-09-26 ENCOUNTER — Emergency Department (HOSPITAL_COMMUNITY)
Admission: EM | Admit: 2015-09-26 | Discharge: 2015-09-26 | Disposition: A | Discharge status: Home / Self Care | Payer: Medicare Other | Attending: Emergency Medicine | Admitting: Emergency Medicine

## 2015-09-26 ENCOUNTER — Encounter (HOSPITAL_COMMUNITY): Payer: Self-pay | Admitting: Emergency Medicine

## 2015-09-26 DIAGNOSIS — F17210 Nicotine dependence, cigarettes, uncomplicated: Secondary | ICD-10-CM | POA: Insufficient documentation

## 2015-09-26 DIAGNOSIS — I120 Hypertensive chronic kidney disease with stage 5 chronic kidney disease or end stage renal disease: Secondary | ICD-10-CM | POA: Diagnosis not present

## 2015-09-26 DIAGNOSIS — R260 Ataxic gait: Secondary | ICD-10-CM | POA: Diagnosis not present

## 2015-09-26 DIAGNOSIS — E1122 Type 2 diabetes mellitus with diabetic chronic kidney disease: Secondary | ICD-10-CM | POA: Insufficient documentation

## 2015-09-26 DIAGNOSIS — I252 Old myocardial infarction: Secondary | ICD-10-CM | POA: Insufficient documentation

## 2015-09-26 DIAGNOSIS — Z8619 Personal history of other infectious and parasitic diseases: Secondary | ICD-10-CM | POA: Insufficient documentation

## 2015-09-26 DIAGNOSIS — Z8719 Personal history of other diseases of the digestive system: Secondary | ICD-10-CM | POA: Diagnosis not present

## 2015-09-26 DIAGNOSIS — Z79899 Other long term (current) drug therapy: Secondary | ICD-10-CM | POA: Diagnosis not present

## 2015-09-26 DIAGNOSIS — R519 Headache, unspecified: Secondary | ICD-10-CM

## 2015-09-26 DIAGNOSIS — N186 End stage renal disease: Secondary | ICD-10-CM | POA: Diagnosis not present

## 2015-09-26 DIAGNOSIS — Z992 Dependence on renal dialysis: Secondary | ICD-10-CM | POA: Insufficient documentation

## 2015-09-26 DIAGNOSIS — H538 Other visual disturbances: Secondary | ICD-10-CM | POA: Diagnosis not present

## 2015-09-26 DIAGNOSIS — R51 Headache: Secondary | ICD-10-CM | POA: Insufficient documentation

## 2015-09-26 DIAGNOSIS — I509 Heart failure, unspecified: Secondary | ICD-10-CM | POA: Insufficient documentation

## 2015-09-26 LAB — COMPREHENSIVE METABOLIC PANEL
ALBUMIN: 2.6 g/dL — AB (ref 3.5–5.0)
ALK PHOS: 101 U/L (ref 38–126)
ALT: 17 U/L (ref 17–63)
AST: 27 U/L (ref 15–41)
Anion gap: 15 (ref 5–15)
BUN: 53 mg/dL — AB (ref 6–20)
CALCIUM: 6.6 mg/dL — AB (ref 8.9–10.3)
CO2: 24 mmol/L (ref 22–32)
CREATININE: 10.13 mg/dL — AB (ref 0.61–1.24)
Chloride: 98 mmol/L — ABNORMAL LOW (ref 101–111)
GFR calc Af Amer: 6 mL/min — ABNORMAL LOW (ref >60)
GFR calc non Af Amer: 5 mL/min — ABNORMAL LOW (ref >60)
GLUCOSE: 92 mg/dL (ref 65–99)
Potassium: 4.6 mmol/L (ref 3.5–5.1)
SODIUM: 137 mmol/L (ref 135–145)
Total Bilirubin: 0.5 mg/dL (ref 0.3–1.2)
Total Protein: 7.1 g/dL (ref 6.5–8.1)

## 2015-09-26 LAB — CBC
HCT: 29.1 % — ABNORMAL LOW (ref 39.0–52.0)
HEMOGLOBIN: 9.1 g/dL — AB (ref 13.0–17.0)
MCH: 29.3 pg (ref 26.0–34.0)
MCHC: 31.3 g/dL (ref 30.0–36.0)
MCV: 93.6 fL (ref 78.0–100.0)
PLATELETS: 82 10*3/uL — AB (ref 150–400)
RBC: 3.11 MIL/uL — ABNORMAL LOW (ref 4.22–5.81)
RDW: 15.5 % (ref 11.5–15.5)
WBC: 6.4 10*3/uL (ref 4.0–10.5)

## 2015-09-26 LAB — ETHANOL

## 2015-09-26 MED ORDER — ACETAMINOPHEN 500 MG PO TABS
1000.0000 mg | ORAL_TABLET | Freq: Once | ORAL | Status: AC
  Administered 2015-09-26: 1000 mg via ORAL
  Filled 2015-09-26: qty 2

## 2015-09-26 NOTE — ED Provider Notes (Signed)
CSN: [id number]     Arrival date & time 09/26/15  0116 History  By signing my name below, I, [name], attest that this documentation has been prepared under the direction and in the presence of Lyndal Pulley, MD. Electronically Signed: Soijett Branch, ED Scribe. 09/26/2015. 3:36 AM.   Chief Complaint  Patient presents with  . Headache      The history is provided by the patient. No language interpreter was used.    HPI Comments: RAFEL Branch is a 60 y.o. male with a medical hx of HTN, ESRD, hepatitis C, who presents to the Emergency Department via EMS complaining of HA onset 2 days. Pt states that his blood pressure has been in the 170's and he thinks that his blood pressure is contributing to his symptoms. Pt states that he came in tonight due to his symptoms worsening. Pt reports that he didn't take any medications, because they took them in jail. He states that he is having associated symptoms of blurred vision and unsteady gait x 1 week. He states that he has not tried any medications for the relief for his symptoms. He denies vomiting and any other symptoms. Denies drinking but notes that he smokes 4-5 cigarettes daily. Denies illegal drug use. Pt is on dialysis with his last dialysis being 2 days ago. Pt next dialysis appointment is tomorrow. Pt states that the dialysis center won't treat him unless his potassium is high. Pt states that he was "fired" and he didn't do anything.    Past Medical History  Diagnosis Date  . Myocardial infarction (HCC)   . Hypertension   . GERD (gastroesophageal reflux disease)   . Pulmonary edema     Just prior to starting HD and resolved with HD. ECHO in 2008 showed normal LVEF, mild diiastolic dysfunction.   . Neuropathy (HCC)     Hx: of in toes  . Hepatitis     Hc: of Hep C  . Cataracts, both eyes     Hx: of "slightly"  . Pancreatitis     Hx: of  . CHF (congestive heart failure) (HCC)   . Diabetes mellitus     not on medication  . 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  Started HD in 2007, gets HD at [facility] on MWF schedule.     Past Surgical History  Procedure Laterality Date  . Dialysis fistula creation Left   . Cholecystectomy    . Colonoscopy      Hx: of  . Ligation of arteriovenous  fistula Left 11/06/2012    Procedure: PLICATION OF BRACHIO-CEPHALIC ARTERIOVENOUS  FISTULA;  Surgeon: Fransisco Hertz, MD;  Location: [facility] OR;  Service: Vascular;  Laterality: Left;  . Pars plana vitrectomy Right 11/10/2014    Procedure: PARS PLANA VITRECTOMY WITH 25 GAUGE;  Surgeon: Sherrie George, MD;  Location: [facility] OR;  Service: Ophthalmology;  Laterality: Right;  . Membrane peel Right 11/10/2014    Procedure: MEMBRANE PEEL;  Surgeon: Sherrie George, MD;  Location[facility] OR;  Service: Ophthalmology;  Laterality: Right;  . Gas/fluid exchange Right 11/10/2014    Procedure: GAS/FLUID EXCHANGE;  Surgeon: Sherrie George, MD;  Location: [facility] OR;  Service: Ophthalmology;  Laterality: Right;  . Laser photo ablation Right 11/10/2014    Procedure: LASER PHOTO ABLATION;  Surgeon: Sherrie George, MD;  Location: [facility] OR;  Service: Ophthalmology;  Laterality: Right;  Endo Laser   Family  History  Problem Relation Age of Onset  . Diabetes Mother    Social History  Substance Use Topics  . Smoking status: Current Every Day Smoker -- 0.35 packs/day for 43 years    Types: Cigarettes    Last Attempt to Quit: 12/16/2012  . Smokeless tobacco: Never Used  . Alcohol Use: No    Review of Systems  Eyes: Positive for visual disturbance.  Musculoskeletal: Positive for gait problem (unsteady).  Skin: Negative for color change, rash and wound.  Neurological: Positive for headaches.  All other systems reviewed and are negative.     Allergies  Eggs or egg-derived products  Home Medications   Prior to Admission medications   Medication Sig Start Date End Date Taking? Authorizing Provider  amLODipine (NORVASC) 10 MG tablet  Take 10 mg by mouth daily.    Historical Provider, MD  b complex-vitamin c-folic acid (NEPHRO-VITE) 0.8 MG TABS Take 0.8 mg by mouth at bedtime.    Historical Provider, MD  calcium gluconate 500 MG tablet Take 1 tablet (500 mg total) by mouth 3 (three) times daily. 09/09/15   Leta Baptist, MD  carvedilol (COREG) 3.125 MG tablet Take 3.125 mg by mouth 2 (two) times daily. 01/08/15   Historical Provider, MD  FOSRENOL 1000 MG chewable tablet Chew 1,000 mg by mouth 5 (five) times daily. 01/06/15   Historical Provider, MD  hydrALAZINE (APRESOLINE) 100 MG tablet Take 100 mg by mouth 2 (two) times daily.  11/05/14   Historical Provider, MD  lisinopril (PRINIVIL,ZESTRIL) 40 MG tablet Take 40 mg by mouth 2 (two) times daily.     Historical Provider, MD  PROAIR HFA 108 (90 BASE) MCG/ACT inhaler Inhale 1 puff into the lungs every 4 (four) hours as needed for wheezing. Wheezing 09/22/14   Historical Provider, MD   BP 150/77 mmHg  Pulse 69  Temp(Src) 98.1 F (36.7 C) (Oral)  Resp 16  SpO2 98% Physical Exam  Constitutional: He is oriented to person, place, and time. He appears well-developed and well-nourished. No distress.  Awake. Speech normal. Pt difficult to exam secondary to participation  HENT:  Head: Normocephalic and atraumatic.  Eyes: EOM are normal.  Neck: Neck supple.  Cardiovascular: Normal rate.   Pulmonary/Chest: Effort normal. No respiratory distress.  Abdominal: He exhibits no distension.  Musculoskeletal: Normal range of motion.  Neurological: He is alert and oriented to person, place, and time. He has normal strength. No cranial nerve deficit or sensory deficit. Coordination and gait normal. GCS eye subscore is 4. GCS verbal subscore is 5. GCS motor subscore is 6.  Skin: Skin is warm and dry.  Psychiatric: He has a normal mood and affect. His behavior is normal.  Nursing note and vitals reviewed.   ED Course  Procedures (including critical care time) DIAGNOSTIC STUDIES: Oxygen  Saturation is 98% on RA, nl by my interpretation.    COORDINATION OF CARE: 3:36 AM Discussed treatment plan with pt at bedside which includes labs and pt agreed to plan.    Labs Review Labs Reviewed  COMPREHENSIVE METABOLIC PANEL - Abnormal; Notable for the following:    Chloride 98 (*)    BUN 53 (*)    Creatinine, Ser 10.13 (*)    Calcium 6.6 (*)    Albumin 2.6 (*)    GFR calc non Af Amer 5 (*)    GFR calc Af Amer 6 (*)    All other components within normal limits  CBC - Abnormal; Notable for the following:  RBC 3.11 (*)    Hemoglobin 9.1 (*)    HCT 29.1 (*)    Platelets 82 (*)    All other components within normal limits    Imaging Review No results found. I have personally reviewed and evaluated these lab results as part of my medical decision-making.   EKG Interpretation None      MDM   Final diagnoses:  Nonintractable headache, unspecified chronicity pattern, unspecified headache type    60 y.o. male presents with headache and mild blurred vision. Labs reassuring, has not missed dialysis. Has not taken anything for headache so provided tylenol. No red flag headache symptoms. No indication for imaging currently.  Multiple ED visits recently for various complaints. Plan to follow up with PCP as needed and return precautions discussed for worsening or new concerning symptoms.   I personally performed the services described in this documentation, which was scribed in my presence. The recorded information has been reviewed and is accurate.      Lyndal Pulleyaniel Jann Ra, MD 09/26/15 [phone]

## 2015-09-26 NOTE — ED Notes (Signed)
Patient arrives via EMS with complaint of headache and blurred vision. States onset of symptoms yesterday. Denies history of similar. ESRD present; MWF, Last dialysis Friday.

## 2015-09-26 NOTE — Discharge Instructions (Signed)

## 2015-09-26 NOTE — ED Notes (Signed)
Pt eating package of cookies during RN assessment.  Closed eyes to fall asleep shortly after assessment

## 2015-09-28 ENCOUNTER — Emergency Department (HOSPITAL_COMMUNITY)
Admission: EM | Admit: 2015-09-28 | Discharge: 2015-09-28 | Disposition: A | Discharge status: Home / Self Care | Payer: Medicare Other | Attending: Emergency Medicine | Admitting: Emergency Medicine

## 2015-09-28 ENCOUNTER — Encounter (HOSPITAL_COMMUNITY): Payer: Self-pay | Admitting: Emergency Medicine

## 2015-09-28 DIAGNOSIS — I252 Old myocardial infarction: Secondary | ICD-10-CM | POA: Diagnosis not present

## 2015-09-28 DIAGNOSIS — I120 Hypertensive chronic kidney disease with stage 5 chronic kidney disease or end stage renal disease: Secondary | ICD-10-CM | POA: Diagnosis not present

## 2015-09-28 DIAGNOSIS — N186 End stage renal disease: Secondary | ICD-10-CM | POA: Diagnosis not present

## 2015-09-28 DIAGNOSIS — Z79899 Other long term (current) drug therapy: Secondary | ICD-10-CM | POA: Insufficient documentation

## 2015-09-28 DIAGNOSIS — H269 Unspecified cataract: Secondary | ICD-10-CM | POA: Diagnosis not present

## 2015-09-28 DIAGNOSIS — Z8619 Personal history of other infectious and parasitic diseases: Secondary | ICD-10-CM | POA: Insufficient documentation

## 2015-09-28 DIAGNOSIS — Z992 Dependence on renal dialysis: Secondary | ICD-10-CM | POA: Diagnosis not present

## 2015-09-28 DIAGNOSIS — Z8669 Personal history of other diseases of the nervous system and sense organs: Secondary | ICD-10-CM | POA: Diagnosis not present

## 2015-09-28 DIAGNOSIS — Z8719 Personal history of other diseases of the digestive system: Secondary | ICD-10-CM | POA: Insufficient documentation

## 2015-09-28 DIAGNOSIS — F17210 Nicotine dependence, cigarettes, uncomplicated: Secondary | ICD-10-CM | POA: Insufficient documentation

## 2015-09-28 DIAGNOSIS — E119 Type 2 diabetes mellitus without complications: Secondary | ICD-10-CM | POA: Diagnosis not present

## 2015-09-28 DIAGNOSIS — Z4931 Encounter for adequacy testing for hemodialysis: Secondary | ICD-10-CM | POA: Diagnosis present

## 2015-09-28 DIAGNOSIS — I509 Heart failure, unspecified: Secondary | ICD-10-CM | POA: Insufficient documentation

## 2015-09-28 LAB — CBC WITH DIFFERENTIAL/PLATELET
Basophils Absolute: 0.1 10*3/uL (ref 0.0–0.1)
Basophils Relative: 1 %
EOS ABS: 0.7 10*3/uL (ref 0.0–0.7)
Eosinophils Relative: 13 %
HEMATOCRIT: 30 % — AB (ref 39.0–52.0)
HEMOGLOBIN: 9.4 g/dL — AB (ref 13.0–17.0)
LYMPHS ABS: 0.9 10*3/uL (ref 0.7–4.0)
LYMPHS PCT: 16 %
MCH: 29 pg (ref 26.0–34.0)
MCHC: 31.3 g/dL (ref 30.0–36.0)
MCV: 92.6 fL (ref 78.0–100.0)
MONOS PCT: 9 %
Monocytes Absolute: 0.5 10*3/uL (ref 0.1–1.0)
NEUTROS PCT: 61 %
Neutro Abs: 3.5 10*3/uL (ref 1.7–7.7)
Platelets: 73 10*3/uL — ABNORMAL LOW (ref 150–400)
RBC: 3.24 MIL/uL — ABNORMAL LOW (ref 4.22–5.81)
RDW: 15.6 % — ABNORMAL HIGH (ref 11.5–15.5)
WBC: 5.7 10*3/uL (ref 4.0–10.5)

## 2015-09-28 LAB — BASIC METABOLIC PANEL
Anion gap: 18 — ABNORMAL HIGH (ref 5–15)
BUN: 78 mg/dL — ABNORMAL HIGH (ref 6–20)
CHLORIDE: 96 mmol/L — AB (ref 101–111)
CO2: 22 mmol/L (ref 22–32)
CREATININE: 13.65 mg/dL — AB (ref 0.61–1.24)
Calcium: 7.5 mg/dL — ABNORMAL LOW (ref 8.9–10.3)
GFR calc non Af Amer: 3 mL/min — ABNORMAL LOW (ref >60)
GFR, EST AFRICAN AMERICAN: 4 mL/min — AB (ref >60)
Glucose, Bld: 110 mg/dL — ABNORMAL HIGH (ref 65–99)
Potassium: 5 mmol/L (ref 3.5–5.1)
Sodium: 136 mmol/L (ref 135–145)

## 2015-09-28 MED ORDER — LOPERAMIDE HCL 2 MG PO CAPS
2.0000 mg | ORAL_CAPSULE | ORAL | Status: DC | PRN
  Administered 2015-09-28: 2 mg via ORAL
  Filled 2015-09-28: qty 1

## 2015-09-28 NOTE — ED Notes (Signed)
Pt. arrived with [place] a motel requesting hemodialysis , last dialysis Friday last week .Respirations unlabored /ambulatory .

## 2015-09-28 NOTE — ED Notes (Signed)
Patient was prescribed several medications for high blood pressure, diabetes, and cirrhosis of the liver but these medications were seized by [facility].

## 2015-09-28 NOTE — ED Provider Notes (Signed)
CSN: [id number]     Arrival date & time 09/28/15  0111 History   First MD Initiated Contact with Patient 09/28/15 0533     No chief complaint on file.    The history is provided by the patient. No language interpreter was used.   Jillene BucksMichael A Conlee is a 60 y.o. male who presents to the Emergency Department complaining of dialysis.  He has a history of end-stage renal disease and receives dialysis Monday Wednesday Friday. He has not received dialysis since Friday due to difficulties getting to the emergency department. He has no current complaints. No chest pain, shortness breath, no headache, vomiting, swelling. Symptoms are mild and constant.  Past Medical History  Diagnosis Date  . Myocardial infarction (HCC)   . Hypertension   . GERD (gastroesophageal reflux disease)   . Pulmonary edema     Just prior to starting HD and resolved with HD. ECHO in 2008 showed normal LVEF, mild diiastolic dysfunction.   . Neuropathy (HCC)     Hx: of in toes  . Hepatitis     Hc: of Hep C  . Cataracts, both eyes     Hx: of "slightly"  . Pancreatitis     Hx: of  . CHF (congestive heart failure) (HCC)   . Diabetes mellitus     not on medication  . 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  Started HD in 2007, [facility] HD [facility] on MWF schedule.     Past Surgical History  Procedure Laterality Date  . Dialysis fistula creation Left   . Cholecystectomy    . Colonoscopy      Hx: of  . Ligation of arteriovenous  fistula Left 11/06/2012    Procedure: PLICATION OF BRACHIO-CEPHALIC ARTERIOVENOUS  FISTULA;  Surgeon: Fransisco HertzBrian L Chen, MD;  [facility];  Service: Vascular;  Laterality: Left;  . Pars plana vitrectomy Right 11/10/2014    Procedure: PARS PLANA VITRECTOMY WITH 25 GAUGE;  Surgeon: Sherrie GeorgeJohn D Matthews, MD;  [facility];  Service: Ophthalmology;  Laterality: Right;  . Membrane peel Right 11/10/2014    Procedure: MEMBRANE PEEL;  Surgeon: Sherrie GeorgeJohn D Matthews, MD;   [facility];  Service: Ophthalmology;  Laterality: Right;  . Gas/fluid exchange Right 11/10/2014    Procedure: GAS/FLUID EXCHANGE;  Surgeon: Sherrie GeorgeJohn D Matthews, MD;  [facility];  Service: Ophthalmology;  Laterality: Right;  . Laser photo ablation Right 11/10/2014    Procedure: LASER PHOTO ABLATION;  Surgeon: Sherrie GeorgeJohn D Matthews, MD;  [facility];  Service: Ophthalmology;  Laterality: Right;  Endo Laser   Family History  Problem Relation Age of Onset  . Diabetes Mother    Social History  Substance Use Topics  . Smoking status: Current Every Day Smoker -- 0.35 packs/day for 43 years    Types: Cigarettes    Last Attempt to Quit: 12/16/2012  . Smokeless tobacco: Never Used  . Alcohol Use: No    Review of Systems  All other systems reviewed and are negative.     Allergies  Eggs or egg-derived products  Home Medications   Prior to Admission medications   Medication Sig Start Date End Date Taking? Authorizing Provider  amLODipine (NORVASC) 10 MG tablet Take 10 mg by mouth daily.   Yes Historical Provider, MD  b complex-vitamin c-folic acid (NEPHRO-VITE) 0.8 MG TABS Take 0.8 mg by mouth at bedtime.   Yes Historical Provider, MD  calcium gluconate  500 MG tablet Take 1 tablet (500 mg total) by mouth 3 (three) times daily. 09/09/15  Yes Leta Baptist, MD  carvedilol (COREG) 3.125 MG tablet Take 3.125 mg by mouth 2 (two) times daily. 01/08/15  Yes Historical Provider, MD  FOSRENOL 1000 MG chewable tablet Chew 1,000 mg by mouth 5 (five) times daily. 01/06/15  Yes Historical Provider, MD  hydrALAZINE (APRESOLINE) 100 MG tablet Take 100 mg by mouth 2 (two) times daily.  11/05/14  Yes Historical Provider, MD  lisinopril (PRINIVIL,ZESTRIL) 40 MG tablet Take 40 mg by mouth 2 (two) times daily.    Yes Historical Provider, MD  PROAIR HFA 108 (90 BASE) MCG/ACT inhaler Inhale 1 puff into the lungs every 4 (four) hours as needed for wheezing. Wheezing 09/22/14  Yes Historical Provider, MD   BP  174/68 mmHg  Pulse 74  Temp(Src) 98.9 F (37.2 C) (Oral)  Resp 16  SpO2 99% Physical Exam  Constitutional: He is oriented to person, place, and time. He appears well-developed.  Chronically ill-appearing  HENT:  Head: Normocephalic and atraumatic.  Cardiovascular: Normal rate and regular rhythm.   No murmur heard. Pulmonary/Chest: Effort normal. No respiratory distress.  Occasional crackle left lung base  Abdominal: Soft. There is no tenderness. There is no rebound and no guarding.  Musculoskeletal: He exhibits no edema or tenderness.  Fistula in the right upper extremity with palpable thrill  Neurological: He is alert and oriented to person, place, and time.  Skin: Skin is warm and dry.  Psychiatric: He has a normal mood and affect. His behavior is normal.  Nursing note and vitals reviewed.   ED Course  Procedures (including critical care time) Labs Review Labs Reviewed  BASIC METABOLIC PANEL - Abnormal; Notable for the following:    Chloride 96 (*)    Glucose, Bld 110 (*)    BUN 78 (*)    Creatinine, Ser 13.65 (*)    Calcium 7.5 (*)    GFR calc non Af Amer 3 (*)    GFR calc Af Amer 4 (*)    Anion gap 18 (*)    All other components within normal limits  CBC WITH DIFFERENTIAL/PLATELET - Abnormal; Notable for the following:    RBC 3.24 (*)    Hemoglobin 9.4 (*)    HCT 30.0 (*)    RDW 15.6 (*)    Platelets 73 (*)    All other components within normal limits  HEPATITIS B SURFACE ANTIGEN    Imaging Review No results found. I have personally reviewed and evaluated these images and lab results as part of my medical decision-making.   EKG Interpretation None      MDM   Final diagnoses:  ESRD (end stage renal disease) on dialysis [facility])    Patient with end-stage renal disease on hemodialysis here for encounter for dialysis. He has no complaints in the emergency department. Discussed the case with Dr. Marisue Humble with nephrology. He plans to dialyze the patient today,  recommends drawing screening labs as patient may be placed at dialysis Center soon. Patient updated of the plan for dialysis later today.    Tilden Fossa, MD 09/28/15 [phone]

## 2015-09-28 NOTE — Progress Notes (Signed)
The patient is well-known to me. He presented to the emergency room for maintenance dialysis after involuntary discharge from the [facility]. He was under behavioral contract and had a altercation with staff with verbally threatening behavior. Since that time, in early April, he has been [place] Molelice is dependent in the hospital based on blood work.  Surrounding this issue has been significant psychosocial stressors including homelessness. I sat down with him today and discussed. He expresses some understanding and contrition for his behavior. He is hopeful to be reestablished with a kidney Center.  Labs from this morning are stable with no emergent indications for dialysis.  I have reached out to the [facility] Dr Arrie Aranoladonato.  It is possible that he can be accepted to that unit. We will follow-up on this over the course of today. In anticipation of that, we will perform dialysis today.

## 2015-09-28 NOTE — Progress Notes (Signed)
Pt. completed and tolerated HD well today. Net UF: 4000 cc. V/S stable post HD (see flow sheet). Discharged via wheelchair to the the building exit, even though pt. is ambulatory and independent with mobility. Pt. has own means of transportation, he said.

## 2015-09-28 NOTE — ED Notes (Signed)
Pt lying in bed, asking for graham crackers and juice

## 2015-09-28 NOTE — ED Notes (Signed)
Pt refusing to have VS taken.

## 2015-09-29 LAB — HEPATITIS B SURFACE ANTIGEN: HEP B S AG: NEGATIVE

## 2015-10-04 ENCOUNTER — Non-Acute Institutional Stay (HOSPITAL_COMMUNITY)
Admission: EM | Admit: 2015-10-04 | Discharge: 2015-10-04 | Disposition: A | Discharge status: Home / Self Care | Payer: Medicare Other | Attending: Emergency Medicine | Admitting: Emergency Medicine

## 2015-10-04 ENCOUNTER — Encounter (HOSPITAL_COMMUNITY): Payer: Self-pay | Admitting: *Deleted

## 2015-10-04 DIAGNOSIS — I252 Old myocardial infarction: Secondary | ICD-10-CM | POA: Insufficient documentation

## 2015-10-04 DIAGNOSIS — I509 Heart failure, unspecified: Secondary | ICD-10-CM | POA: Diagnosis not present

## 2015-10-04 DIAGNOSIS — F17210 Nicotine dependence, cigarettes, uncomplicated: Secondary | ICD-10-CM | POA: Diagnosis not present

## 2015-10-04 DIAGNOSIS — I132 Hypertensive heart and chronic kidney disease with heart failure and with stage 5 chronic kidney disease, or end stage renal disease: Secondary | ICD-10-CM | POA: Insufficient documentation

## 2015-10-04 DIAGNOSIS — Z992 Dependence on renal dialysis: Secondary | ICD-10-CM | POA: Diagnosis present

## 2015-10-04 DIAGNOSIS — E1122 Type 2 diabetes mellitus with diabetic chronic kidney disease: Secondary | ICD-10-CM | POA: Diagnosis not present

## 2015-10-04 DIAGNOSIS — N186 End stage renal disease: Secondary | ICD-10-CM | POA: Diagnosis not present

## 2015-10-04 DIAGNOSIS — I120 Hypertensive chronic kidney disease with stage 5 chronic kidney disease or end stage renal disease: Secondary | ICD-10-CM | POA: Diagnosis present

## 2015-10-04 LAB — I-STAT CHEM 8, ED
BUN: 86 mg/dL — ABNORMAL HIGH (ref 6–20)
CALCIUM ION: 0.9 mmol/L — AB (ref 1.12–1.23)
Chloride: 100 mmol/L — ABNORMAL LOW (ref 101–111)
Creatinine, Ser: 16 mg/dL — ABNORMAL HIGH (ref 0.61–1.24)
GLUCOSE: 89 mg/dL (ref 65–99)
HCT: 32 % — ABNORMAL LOW (ref 39.0–52.0)
HEMOGLOBIN: 10.9 g/dL — AB (ref 13.0–17.0)
POTASSIUM: 5.4 mmol/L — AB (ref 3.5–5.1)
Sodium: 134 mmol/L — ABNORMAL LOW (ref 135–145)
TCO2: 22 mmol/L (ref 0–100)

## 2015-10-04 MED ORDER — SODIUM CHLORIDE 0.9 % IV SOLN: 100.0000 mL | INTRAVENOUS | Status: DC | PRN

## 2015-10-04 MED ORDER — PENTAFLUOROPROP-TETRAFLUOROETH EX AERO: 1.0000 "application " | INHALATION_SPRAY | CUTANEOUS | Status: DC | PRN

## 2015-10-04 MED ORDER — HEPARIN SODIUM (PORCINE) 1000 UNIT/ML DIALYSIS: 1000.0000 [IU] | INTRAMUSCULAR | Status: DC | PRN

## 2015-10-04 MED ORDER — LOPERAMIDE HCL 2 MG PO CAPS
2.0000 mg | ORAL_CAPSULE | Freq: Once | ORAL | Status: AC
  Administered 2015-10-04: 2 mg via ORAL
  Filled 2015-10-04: qty 1

## 2015-10-04 MED ORDER — LIDOCAINE HCL (PF) 1 % IJ SOLN: 5.0000 mL | INTRAMUSCULAR | Status: DC | PRN

## 2015-10-04 MED ORDER — LIDOCAINE-PRILOCAINE 2.5-2.5 % EX CREA: 1.0000 "application " | TOPICAL_CREAM | CUTANEOUS | Status: DC | PRN

## 2015-10-04 MED ORDER — ALTEPLASE 2 MG IJ SOLR: 2.0000 mg | Freq: Once | INTRAMUSCULAR | Status: DC | PRN

## 2015-10-04 MED ORDER — HEPARIN SODIUM (PORCINE) 1000 UNIT/ML DIALYSIS: 20.0000 [IU]/kg | INTRAMUSCULAR | Status: DC | PRN

## 2015-10-04 NOTE — ED Notes (Signed)
Breakfast tray ordered for pt

## 2015-10-04 NOTE — ED Notes (Signed)
Bruit heard and Thrill felt.

## 2015-10-04 NOTE — ED Notes (Signed)
Pt states he is here for dialysis. Last dialysis on Tuesday.

## 2015-10-04 NOTE — Progress Notes (Signed)
Patient alert and oriented x4, VSS, collected all personal belongings and was escorted to 1st floor entrance, discharged to home per Dr. Jon GillsGoldsborough's orders.

## 2015-10-04 NOTE — ED Notes (Signed)
Meal tray delivered to pt

## 2015-10-04 NOTE — Procedures (Signed)
Patient was seen on dialysis and the procedure was supervised.  BFR 400  Via AVG BP is  187/95.   Patient appears to be tolerating treatment well- his life appears to be falling apart right now- he is homeless- just take things "one day at a time" telling the nurses that he will only run 3.5 hours.  Last HD was Tuesday.  hgb close to 11 so no need for esa.  Says he has no BP meds and has not established with any MD   Hiroshi Krummel A 10/04/2015

## 2015-10-04 NOTE — ED Provider Notes (Signed)
CSN: [id number]     Arrival date & time 10/04/15  0047 History   First MD Initiated Contact with Patient 10/04/15 [phone]     Chief Complaint  Patient presents with  . Dialysis      (Consider location/radiation/quality/duration/timing/severity/associated sxs/prior Treatment) The history is provided by the patient.  Jerome BucksMichael A Branch is a 60 y/o male who presents to the ED complaining of need for dialysis.  He has a PMHx of ESRD and does not currently have a place he dialyses, but previously received dialysis MWF.  His last dialysis was 6 days ago.  He has no complaints and was sleeping in the ER prior to evaluation.  He has not complaints including no CP, abdominal pain, fever, chills, SOB.    Past Medical History  Diagnosis Date  . Myocardial infarction (HCC)   . Hypertension   . GERD (gastroesophageal reflux disease)   . Pulmonary edema     Just prior to starting HD and resolved with HD. ECHO in 2008 showed normal LVEF, mild diiastolic dysfunction.   . Neuropathy (HCC)     Hx: of in toes  . Hepatitis     Hc: of Hep C  . Cataracts, both eyes     Hx: of "slightly"  . Pancreatitis     Hx: of  . CHF (congestive heart failure) (HCC)   . Diabetes mellitus     not on medication  . 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  Started HD in 2007, [facility] on MWF schedule.     Past Surgical History  Procedure Laterality Date  . Dialysis fistula creation Left   . Cholecystectomy    . Colonoscopy      Hx: of  . Ligation of arteriovenous  fistula Left 11/06/2012    Procedure: PLICATION OF BRACHIO-CEPHALIC ARTERIOVENOUS  FISTULA;  Surgeon: Fransisco HertzBrian L Chen, MD;  [facility];  Service: Vascular;  Laterality: Left;  . Pars plana vitrectomy Right 11/10/2014    Procedure: PARS PLANA VITRECTOMY WITH 25 GAUGE;  Surgeon: Sherrie GeorgeJohn D Matthews, MD;  [facility];  Service: Ophthalmology;  Laterality: Right;  . Membrane peel Right 11/10/2014    Procedure:  MEMBRANE PEEL;  Surgeon: Sherrie GeorgeJohn D Matthews, MD;  [facility];  Service: Ophthalmology;  Laterality: Right;  . Gas/fluid exchange Right 11/10/2014    Procedure: GAS/FLUID EXCHANGE;  Surgeon: Sherrie GeorgeJohn D Matthews, MD;  [facility];  Service: Ophthalmology;  Laterality: Right;  . Laser photo ablation Right 11/10/2014    Procedure: LASER PHOTO ABLATION;  Surgeon: Sherrie GeorgeJohn D Matthews, MD;  [facility];  Service: Ophthalmology;  Laterality: Right;  Endo Laser   Family History  Problem Relation Age of Onset  . Diabetes Mother    Social History  Substance Use Topics  . Smoking status: Current Every Day Smoker -- 0.35 packs/day for 43 years    Types: Cigarettes    Last Attempt to Quit: 12/16/2012  . Smokeless tobacco: Never Used  . Alcohol Use: No    Review of Systems  All other systems reviewed and are negative.     Allergies  Eggs or egg-derived products  Home Medications   Prior to Admission medications   Medication Sig Start Date End Date Taking? Authorizing Provider  amLODipine (NORVASC) 10 MG tablet Take 10 mg by mouth daily.   Yes Historical Provider, MD  b complex-vitamin c-folic acid (NEPHRO-VITE) 0.8 MG TABS Take 0.8  mg by mouth at bedtime.   Yes Historical Provider, MD  calcium gluconate 500 MG tablet Take 1 tablet (500 mg total) by mouth 3 (three) times daily. 09/09/15  Yes Leta Baptist, MD  carvedilol (COREG) 3.125 MG tablet Take 3.125 mg by mouth 2 (two) times daily. 01/08/15  Yes Historical Provider, MD  FOSRENOL 1000 MG chewable tablet Chew 1,000 mg by mouth 5 (five) times daily. 01/06/15  Yes Historical Provider, MD  hydrALAZINE (APRESOLINE) 100 MG tablet Take 100 mg by mouth 2 (two) times daily.  11/05/14  Yes Historical Provider, MD  lisinopril (PRINIVIL,ZESTRIL) 40 MG tablet Take 40 mg by mouth 2 (two) times daily.    Yes Historical Provider, MD  PROAIR HFA 108 (90 BASE) MCG/ACT inhaler Inhale 1 puff into the lungs every 4 (four) hours as needed for wheezing. Wheezing  09/22/14  Yes Historical Provider, MD   BP 144/103 mmHg  Pulse 79  Temp(Src) 98.3 F (36.8 C) (Oral)  Resp 18  Ht 5\' 10"  (1.778 m)  Wt 74.844 kg  BMI 23.68 kg/m2  SpO2 97% Physical Exam  Constitutional: He is oriented to person, place, and time. He appears well-developed and well-nourished. No distress.  Chronically ill appearing male, non-toxic in appearance, sleeping in ER, easily arousable  HENT:  Head: Normocephalic and atraumatic.  Nose: Nose normal.  Mouth/Throat: Oropharynx is clear and moist. No oropharyngeal exudate.  Eyes: Conjunctivae and EOM are normal. Pupils are equal, round, and reactive to light. Right eye exhibits no discharge. Left eye exhibits no discharge. No scleral icterus.  Neck: Normal range of motion. No JVD present. No tracheal deviation present. No thyromegaly present.  Cardiovascular: Normal rate, regular rhythm, normal heart sounds and intact distal pulses.  Exam reveals no gallop and no friction rub.   No murmur heard. Pulmonary/Chest: Effort normal. No respiratory distress. He has no wheezes. He has rales. He exhibits no tenderness.  Abdominal: Soft. Bowel sounds are normal. He exhibits no distension and no mass. There is no tenderness. There is no rebound and no guarding.  Musculoskeletal: Normal range of motion. He exhibits no edema or tenderness.  Lymphadenopathy:    He has no cervical adenopathy.  Neurological: He is alert and oriented to person, place, and time. He has normal reflexes. No cranial nerve deficit. He exhibits normal muscle tone. Coordination normal.  Skin: Skin is warm and dry. No rash noted. He is not diaphoretic. No erythema. No pallor.  Right arm palpable thrill AV fistula  Psychiatric: He has a normal mood and affect. His behavior is normal. Judgment and thought content normal.  Nursing note and vitals reviewed.   ED Course  Procedures (including critical care time) Labs Review Labs Reviewed  I-STAT CHEM 8, ED - Abnormal;  Notable for the following:    Sodium 134 (*)    Potassium 5.4 (*)    Chloride 100 (*)    BUN 86 (*)    Creatinine, Ser 16.00 (*)    Calcium, Ion 0.90 (*)    Hemoglobin 10.9 (*)    HCT 32.0 (*)    All other components within normal limits    Imaging Review No results found. I have personally reviewed and evaluated these images and lab results as part of my medical decision-making.   EKG Interpretation None      MDM   Patient with end-stage renal disease on hemodialysis here for encounter for dialysis. He has no complaints in the emergency department.  Nephrology consulted.  Spoke with  Dr. Allena Katz, they will get pt scheduled for dialysis today.  No other labs required per nephrology.  Patient updated of the plan for dialysis later today.   11:26 AM Pt requesting immodium for loose stools.  Ordered.  Final diagnoses:  ESRD (end stage renal disease) on dialysis [facility])       Danelle Berry, PA-C 10/04/15 1126  Shon Baton, MD 10/04/15 2307

## 2015-10-06 ENCOUNTER — Non-Acute Institutional Stay (HOSPITAL_COMMUNITY)
Admission: EM | Admit: 2015-10-06 | Discharge: 2015-10-06 | Payer: Medicare Other | Attending: Emergency Medicine | Admitting: Emergency Medicine

## 2015-10-06 ENCOUNTER — Encounter (HOSPITAL_COMMUNITY): Payer: Self-pay | Admitting: *Deleted

## 2015-10-06 DIAGNOSIS — Z992 Dependence on renal dialysis: Secondary | ICD-10-CM | POA: Diagnosis present

## 2015-10-06 DIAGNOSIS — F17210 Nicotine dependence, cigarettes, uncomplicated: Secondary | ICD-10-CM | POA: Insufficient documentation

## 2015-10-06 DIAGNOSIS — E1122 Type 2 diabetes mellitus with diabetic chronic kidney disease: Secondary | ICD-10-CM | POA: Insufficient documentation

## 2015-10-06 DIAGNOSIS — I132 Hypertensive heart and chronic kidney disease with heart failure and with stage 5 chronic kidney disease, or end stage renal disease: Secondary | ICD-10-CM | POA: Insufficient documentation

## 2015-10-06 DIAGNOSIS — I252 Old myocardial infarction: Secondary | ICD-10-CM | POA: Insufficient documentation

## 2015-10-06 DIAGNOSIS — N186 End stage renal disease: Secondary | ICD-10-CM

## 2015-10-06 DIAGNOSIS — Z79899 Other long term (current) drug therapy: Secondary | ICD-10-CM | POA: Diagnosis not present

## 2015-10-06 DIAGNOSIS — I509 Heart failure, unspecified: Secondary | ICD-10-CM | POA: Insufficient documentation

## 2015-10-06 LAB — CBC
HCT: 29.3 % — ABNORMAL LOW (ref 39.0–52.0)
Hemoglobin: 9.1 g/dL — ABNORMAL LOW (ref 13.0–17.0)
MCH: 29.2 pg (ref 26.0–34.0)
MCHC: 31.1 g/dL (ref 30.0–36.0)
MCV: 93.9 fL (ref 78.0–100.0)
PLATELETS: 79 10*3/uL — AB (ref 150–400)
RBC: 3.12 MIL/uL — AB (ref 4.22–5.81)
RDW: 15.6 % — AB (ref 11.5–15.5)
WBC: 5.5 10*3/uL (ref 4.0–10.5)

## 2015-10-06 LAB — I-STAT CHEM 8, ED
BUN: 43 mg/dL — ABNORMAL HIGH (ref 6–20)
Calcium, Ion: 0.88 mmol/L — ABNORMAL LOW (ref 1.12–1.23)
Chloride: 98 mmol/L — ABNORMAL LOW (ref 101–111)
Creatinine, Ser: 11 mg/dL — ABNORMAL HIGH (ref 0.61–1.24)
GLUCOSE: 76 mg/dL (ref 65–99)
HEMATOCRIT: 32 % — AB (ref 39.0–52.0)
HEMOGLOBIN: 10.9 g/dL — AB (ref 13.0–17.0)
POTASSIUM: 4.4 mmol/L (ref 3.5–5.1)
SODIUM: 137 mmol/L (ref 135–145)
TCO2: 26 mmol/L (ref 0–100)

## 2015-10-06 MED ORDER — SODIUM CHLORIDE 0.9 % IV SOLN: 100.0000 mL | INTRAVENOUS | Status: DC | PRN

## 2015-10-06 MED ORDER — LIDOCAINE HCL (PF) 1 % IJ SOLN: 5.0000 mL | INTRAMUSCULAR | Status: DC | PRN

## 2015-10-06 MED ORDER — HEPARIN SODIUM (PORCINE) 1000 UNIT/ML DIALYSIS
20.0000 [IU]/kg | INTRAMUSCULAR | Status: DC | PRN
  Filled 2015-10-06: qty 2

## 2015-10-06 MED ORDER — HEPARIN SODIUM (PORCINE) 1000 UNIT/ML DIALYSIS
1000.0000 [IU] | INTRAMUSCULAR | Status: DC | PRN
  Filled 2015-10-06: qty 1

## 2015-10-06 MED ORDER — PENTAFLUOROPROP-TETRAFLUOROETH EX AERO
1.0000 "application " | INHALATION_SPRAY | CUTANEOUS | Status: DC | PRN
  Filled 2015-10-06: qty 30

## 2015-10-06 MED ORDER — LIDOCAINE-PRILOCAINE 2.5-2.5 % EX CREA
1.0000 "application " | TOPICAL_CREAM | CUTANEOUS | Status: DC | PRN
  Filled 2015-10-06: qty 5

## 2015-10-06 MED ORDER — ALTEPLASE 2 MG IJ SOLR: 2.0000 mg | Freq: Once | INTRAMUSCULAR | Status: DC | PRN

## 2015-10-06 NOTE — ED Notes (Signed)
Pt here for HD.  He does not have a center at this time and comes here for HD treatment.  Pt had last treatment on Monday.  Pt [facility] with 2 sheriffs [facility].

## 2015-10-06 NOTE — Procedures (Signed)
Patient was seen on dialysis and the procedure was supervised.  BFR 400  Via AVF BP is  180/90.   Patient appears to be tolerating treatment well.  Now [place] violation, brought in by police.  We were able to secure a spot at [facility] Tuesday AM- to be there at 10:30.  He will need one more treatment here over the weekend on Saturday   [name] A 10/06/2015

## 2015-10-06 NOTE — Progress Notes (Signed)
PA updated about patient weight, treatment time decreased and bath changed.

## 2015-10-06 NOTE — ED Provider Notes (Signed)
CSN: [id number]     Arrival date & time 10/06/15  4098 History   First MD Initiated Contact with Patient 10/06/15 0919     Chief Complaint  Patient presents with  . Vascular Access Problem     (Consider location/radiation/quality/duration/timing/severity/associated sxs/prior Treatment) HPI  [name] is a 60 y/o male who presents to the ED complaining of need for dialysis. He has a PMHx of ESRD and does not currently have a Dialysis center, but previously received dialysis MWF. His last dialysis was 2 days ago. He has not complaints including no CP, abdominal pain, fever, chills, SOB.Today he is in custody of law enforcement.  Past Medical History  Diagnosis Date  . Myocardial infarction (HCC)   . Hypertension   . GERD (gastroesophageal reflux disease)   . Pulmonary edema     Just prior to starting HD and resolved with HD. ECHO in 2008 showed normal LVEF, mild diiastolic dysfunction.   . Neuropathy (HCC)     Hx: of in toes  . Hepatitis     Hc: of Hep C  . Cataracts, both eyes     Hx: of "slightly"  . Pancreatitis     Hx: of  . CHF (congestive heart failure) (HCC)   . Diabetes mellitus     not on medication  . 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  Started HD in 2007, gets HD at [facility] on MWF schedule.     Past Surgical History  Procedure Laterality Date  . Dialysis fistula creation Left   . Cholecystectomy    . Colonoscopy      Hx: of  . Ligation of arteriovenous  fistula Left 11/06/2012    Procedure: PLICATION OF BRACHIO-CEPHALIC ARTERIOVENOUS  FISTULA;  Surgeon: Fransisco Hertz, MD;  [facility];  Service: Vascular;  Laterality: Left;  . Pars plana vitrectomy Right 11/10/2014    Procedure: PARS PLANA VITRECTOMY WITH 25 GAUGE;  Surgeon: Sherrie George, MD;  Location: [facility] OR;  Service: Ophthalmology;  Laterality: Right;  . Membrane peel Right 11/10/2014    Procedure: MEMBRANE PEEL;  Surgeon: Sherrie George, MD;  Location:  [facility] OR;  Service: Ophthalmology;  Laterality: Right;  . Gas/fluid exchange Right 11/10/2014    Procedure: GAS/FLUID EXCHANGE;  Surgeon: Sherrie George, MD;  Location: [facility] OR;  Service: Ophthalmology;  Laterality: Right;  . Laser photo ablation Right 11/10/2014    Procedure: LASER PHOTO ABLATION;  Surgeon: Sherrie George, MD;  Location: [facility] OR;  Service: Ophthalmology;  Laterality: Right;  Endo Laser   Family History  Problem Relation Age of Onset  . Diabetes Mother    Social History  Substance Use Topics  . Smoking status: Current Every Day Smoker -- 0.35 packs/day for 43 years    Types: Cigarettes    Last Attempt to Quit: 12/16/2012  . Smokeless tobacco: Never Used  . Alcohol Use: No    Review of Systems  All other systems reviewed and are negative.     Allergies  Eggs or egg-derived products  Home Medications   Prior to Admission medications   Medication Sig Start Date End Date Taking? Authorizing Provider  amLODipine (NORVASC) 10 MG tablet Take 10 mg by mouth daily.    Historical Provider, MD  b complex-vitamin c-folic acid (NEPHRO-VITE) 0.8 MG TABS Take 0.8 mg by mouth at bedtime.    Historical Provider, MD  calcium gluconate 500 MG tablet Take  1 tablet (500 mg total) by mouth 3 (three) times daily. 09/09/15   Leta BaptistEmily Roe Nguyen, MD  carvedilol (COREG) 3.125 MG tablet Take 3.125 mg by mouth 2 (two) times daily. 01/08/15   Historical Provider, MD  FOSRENOL 1000 MG chewable tablet Chew 1,000 mg by mouth 5 (five) times daily. 01/06/15   Historical Provider, MD  hydrALAZINE (APRESOLINE) 100 MG tablet Take 100 mg by mouth 2 (two) times daily.  11/05/14   Historical Provider, MD  lisinopril (PRINIVIL,ZESTRIL) 40 MG tablet Take 40 mg by mouth 2 (two) times daily.     Historical Provider, MD  PROAIR HFA 108 (90 BASE) MCG/ACT inhaler Inhale 1 puff into the lungs every 4 (four) hours as needed for wheezing. Wheezing 09/22/14   Historical Provider, MD   BP 175/91 mmHg  Pulse 76  Temp(Src)  98.3 F (36.8 C) (Oral)  Resp 18  Wt 165 lb (74.844 kg)  SpO2 100% Physical Exam  Constitutional: He is oriented to person, place, and time. He appears well-developed and well-nourished. No distress.  HENT:  Head: Normocephalic and atraumatic.  Eyes: Conjunctivae are normal.  Neck: Neck supple. No tracheal deviation present.  Cardiovascular: Normal rate and regular rhythm.   Pulmonary/Chest: Effort normal. No respiratory distress.  Abdominal: Soft. He exhibits no distension.  Neurological: He is alert and oriented to person, place, and time.  Skin: Skin is warm and dry.  Psychiatric: He has a normal mood and affect.    ED Course  Procedures (including critical care time) Labs Review Labs Reviewed  I-STAT CHEM 8, ED - Abnormal; Notable for the following:    Chloride 98 (*)    BUN 43 (*)    Creatinine, Ser 11.00 (*)    Calcium, Ion 0.88 (*)    Hemoglobin 10.9 (*)    HCT 32.0 (*)    All other components within normal limits    Imaging Review No results found. I have personally reviewed and evaluated these images and lab results as part of my medical decision-making.   EKG Interpretation None      MDM   Final diagnoses:  ESRD needing dialysis [facility](HCC)    60 y.o. male presents requesting dialysis. He has been coming intermittently through the emergency department to receive dialysis but usually delays multiple extra days prior to presentation. Today he is in custody of law enforcement and presents on his normal dialysis day. No emergent indication for dialysis currently, will consult nephrology regarding disposition planning.  Nephrology is planning to dialyze the patient here today and now that he is in custody and has reliable transportation they are working to establish him at a new Center for his Friday treatment.    Lyndal Pulleyaniel Tynetta Bachmann, MD 10/06/15 [phone]

## 2015-10-06 NOTE — ED Notes (Signed)
Pt to HD with RN and sheriff deputies

## 2015-10-06 NOTE — Progress Notes (Signed)
Discharge patient after hemodialysis treatment. Patient was alert and oriented. VSS, escorted by jail officer via wheelchair. Dressing to fistula site intact, no bleeding noted.

## 2015-10-08 ENCOUNTER — Non-Acute Institutional Stay (HOSPITAL_COMMUNITY)
Admission: EM | Admit: 2015-10-08 | Discharge: 2015-10-08 | Payer: Medicare Other | Attending: Emergency Medicine | Admitting: Emergency Medicine

## 2015-10-08 ENCOUNTER — Encounter (HOSPITAL_COMMUNITY): Payer: Self-pay | Admitting: Emergency Medicine

## 2015-10-08 DIAGNOSIS — Z79899 Other long term (current) drug therapy: Secondary | ICD-10-CM | POA: Diagnosis not present

## 2015-10-08 DIAGNOSIS — Z992 Dependence on renal dialysis: Secondary | ICD-10-CM | POA: Diagnosis present

## 2015-10-08 DIAGNOSIS — I252 Old myocardial infarction: Secondary | ICD-10-CM | POA: Diagnosis not present

## 2015-10-08 DIAGNOSIS — F17210 Nicotine dependence, cigarettes, uncomplicated: Secondary | ICD-10-CM | POA: Diagnosis not present

## 2015-10-08 DIAGNOSIS — N186 End stage renal disease: Secondary | ICD-10-CM | POA: Diagnosis present

## 2015-10-08 DIAGNOSIS — I132 Hypertensive heart and chronic kidney disease with heart failure and with stage 5 chronic kidney disease, or end stage renal disease: Secondary | ICD-10-CM | POA: Diagnosis not present

## 2015-10-08 DIAGNOSIS — I509 Heart failure, unspecified: Secondary | ICD-10-CM | POA: Diagnosis not present

## 2015-10-08 DIAGNOSIS — E1140 Type 2 diabetes mellitus with diabetic neuropathy, unspecified: Secondary | ICD-10-CM | POA: Insufficient documentation

## 2015-10-08 DIAGNOSIS — E1122 Type 2 diabetes mellitus with diabetic chronic kidney disease: Secondary | ICD-10-CM | POA: Diagnosis not present

## 2015-10-08 NOTE — ED Notes (Signed)
Patient reports "I'm just here for dialysis, today is my day". Access to right arm. Patient demanding breakfast tray.

## 2015-10-08 NOTE — ED Notes (Signed)
Pt rcvd breakfast tray, report given to Sal

## 2015-10-08 NOTE — Progress Notes (Signed)
Pt. came for HD today, escorted by a Engineer, productionheriff Deputy Officer. Seen and examined by Dr. Kathrene BongoGoldsborough. Pt. completed the 3.5 hrs tx, and tolerated tx well. No medications given. Discharged alert/ oriented x4 and stable  [facility].

## 2015-10-08 NOTE — Procedures (Signed)
Patient was seen on dialysis and the procedure was supervised.  BFR 400  Via AVF BP is  157/75.   Patient appears to be tolerating treatment well- last treatment in the hospital- [facility]30  [name] 10/08/2015

## 2015-10-08 NOTE — ED Provider Notes (Signed)
CSN: [id number]     Arrival date & time 10/08/15  [id number] History   First MD Initiated Contact with Patient 10/08/15 [phone]     Chief Complaint  Patient presents with  . Vascular Access Problem      The history is provided by the patient.  Patient presents for dialysis in custody. States he is in jail. Previously did not have a dialysis center but one has been arranged starting on Tuesday, with this being Friday. The plan was for him to come in tomorrow on Saturday for his last dialysis before Tuesday, however he was brought in today. He is without complaints. He is asking for some food. States he'll be easily able to go to 4 days without dialysis.  Past Medical History  Diagnosis Date  . Myocardial infarction (HCC)   . Hypertension   . GERD (gastroesophageal reflux disease)   . Pulmonary edema     Just prior to starting HD and resolved with HD. ECHO in 2008 showed normal LVEF, mild diiastolic dysfunction.   . Neuropathy (HCC)     Hx: of in toes  . Hepatitis     Hc: of Hep C  . Cataracts, both eyes     Hx: of "slightly"  . Pancreatitis     Hx: of  . CHF (congestive heart failure) (HCC)   . Diabetes mellitus     not on medication  . 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  Started HD in 2007, [facility].     Past Surgical History  Procedure Laterality Date  . Dialysis fistula creation Left   . Cholecystectomy    . Colonoscopy      Hx: of  . Ligation of arteriovenous  fistula Left 11/06/2012    Procedure: PLICATION OF BRACHIO-CEPHALIC ARTERIOVENOUS  FISTULA;  Surgeon: Fransisco HertzBrian L Chen, MD;  [facility];  Service: Vascular;  Laterality: Left;  . Pars plana vitrectomy Right 11/10/2014    Procedure: PARS PLANA VITRECTOMY WITH 25 GAUGE;  Surgeon: Sherrie GeorgeJohn D Matthews, MD;  [facility];  Service: Ophthalmology;  Laterality: Right;  . Membrane peel Right 11/10/2014    Procedure: MEMBRANE PEEL;  Surgeon: Sherrie GeorgeJohn D Matthews, MD;   [facility];  Service: Ophthalmology;  Laterality: Right;  . Gas/fluid exchange Right 11/10/2014    Procedure: GAS/FLUID EXCHANGE;  Surgeon: Sherrie GeorgeJohn D Matthews, MD;  [facility];  Service: Ophthalmology;  Laterality: Right;  . Laser photo ablation Right 11/10/2014    Procedure: LASER PHOTO ABLATION;  Surgeon: Sherrie GeorgeJohn D Matthews, MD;  [facility];  Service: Ophthalmology;  Laterality: Right;  Endo Laser   Family History  Problem Relation Age of Onset  . Diabetes Mother    Social History  Substance Use Topics  . Smoking status: Current Every Day Smoker -- 0.35 packs/day for 43 years    Types: Cigarettes    Last Attempt to Quit: 12/16/2012  . Smokeless tobacco: Never Used  . Alcohol Use: No    Review of Systems  Constitutional: Negative for appetite change.  Respiratory: Negative for shortness of breath.   Cardiovascular: Negative for chest pain.  Skin: Negative for wound.      Allergies  Eggs or egg-derived products  Home Medications   Prior to Admission medications   Medication Sig Start Date End Date Taking? Authorizing Provider  amLODipine (NORVASC) 10 MG tablet Take 10 mg by mouth daily.  Yes Historical Provider, MD  b complex-vitamin c-folic acid (NEPHRO-VITE) 0.8 MG TABS Take 0.8 mg by mouth at bedtime.   Yes Historical Provider, MD  calcium gluconate 500 MG tablet Take 1 tablet (500 mg total) by mouth 3 (three) times daily. 09/09/15  Yes Leta Baptist, MD  carvedilol (COREG) 3.125 MG tablet Take 3.125 mg by mouth 2 (two) times daily. 01/08/15  Yes Historical Provider, MD  FOSRENOL 1000 MG chewable tablet Chew 1,000 mg by mouth 5 (five) times daily. 01/06/15  Yes Historical Provider, MD  hydrALAZINE (APRESOLINE) 100 MG tablet Take 100 mg by mouth 2 (two) times daily.  11/05/14  Yes Historical Provider, MD  lisinopril (PRINIVIL,ZESTRIL) 40 MG tablet Take 40 mg by mouth 2 (two) times daily.    Yes Historical Provider, MD  PROAIR HFA 108 (90 BASE) MCG/ACT inhaler Inhale  1 puff into the lungs every 4 (four) hours as needed for wheezing. Wheezing 09/22/14  Yes Historical Provider, MD   BP 174/65 mmHg  Pulse 73  Temp(Src) 97.8 F (36.6 C) (Oral)  Resp 16  Ht  (1.676 m)  Wt 152 lb 8.9 oz (69.2 kg)  BMI 24.64 kg/m2  SpO2 100% Physical Exam  Constitutional: He appears well-developed.  HENT:  Head: Atraumatic.  Cardiovascular: Normal rate.   Pulmonary/Chest: Effort normal. He has no rales.  Neurological: He is alert.  Skin: Skin is warm.    ED Course  Procedures (including critical care time) Labs Review Labs Reviewed - No data to display  Imaging Review No results found. I have personally reviewed and evaluated these images and lab results as part of my medical decision-making.   EKG Interpretation None      MDM   Final diagnoses:  ESRD needing dialysis [facility]    Patient with end-stage renal disease on dialysis. Discussed with Dr. Kathrene Bongo, will dialyze the patient.    Benjiman Core, MD 10/08/15 1515

## 2015-10-08 NOTE — ED Notes (Signed)
Renal diet ordered as breakfast tray, called in.

## 2015-10-08 NOTE — ED Notes (Signed)
Law enforcement at the bedside.

## 2015-11-13 DEATH — deceased

## 2015-11-29 IMAGING — DX DG ELBOW COMPLETE 3+V*R*
4 series · 4 of 4 positions shown · non-contrast
Comparison: None.

CLINICAL DATA: Sharp intermittent right elbow pain

EXAM:
RIGHT ELBOW - COMPLETE 3+ VIEW

[elbow ap]
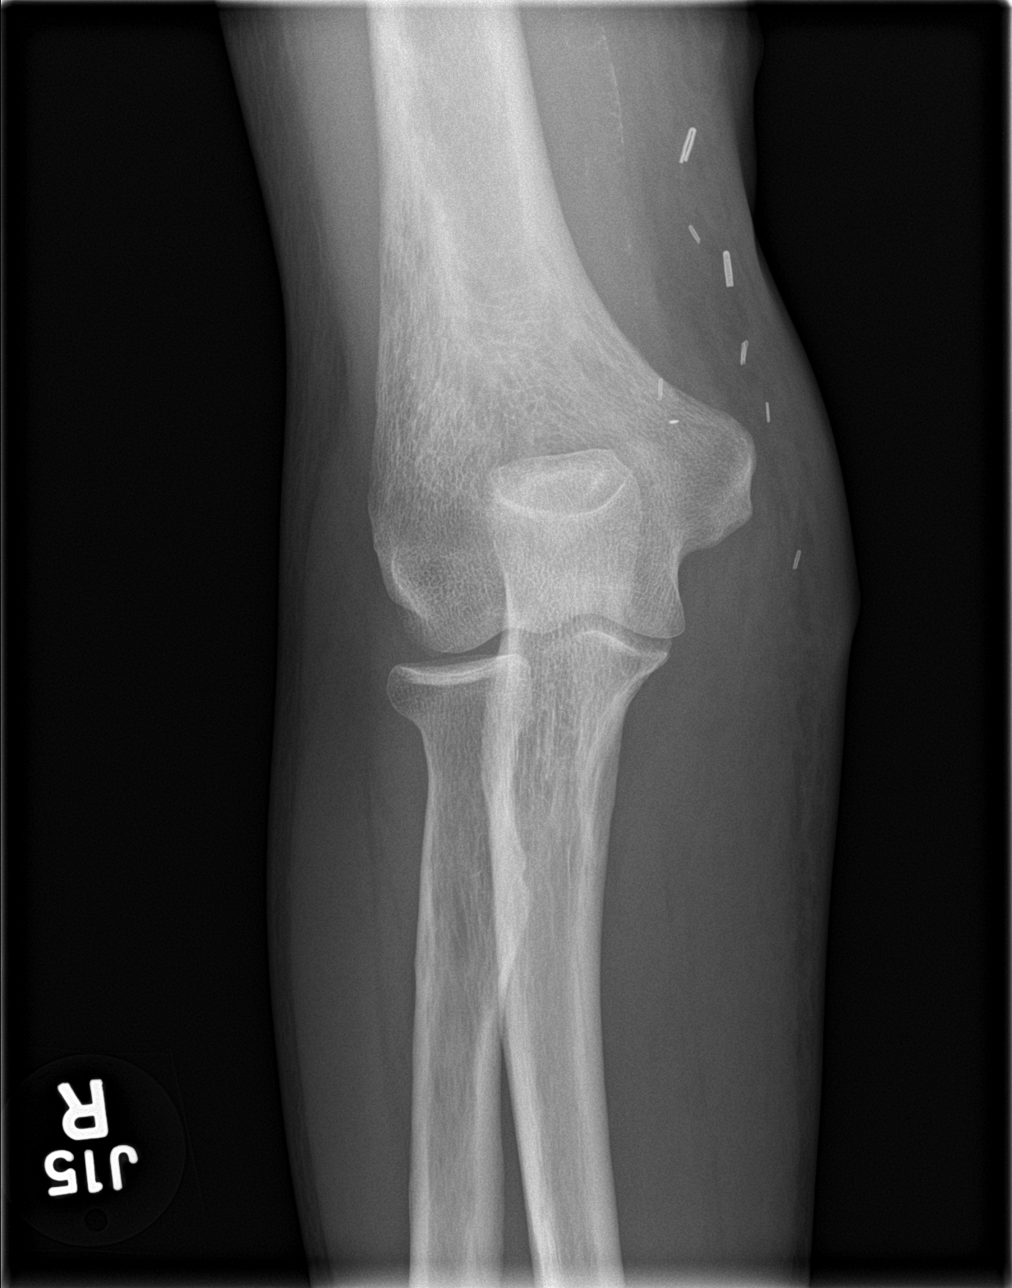

[elbow obl (1 of 2)]
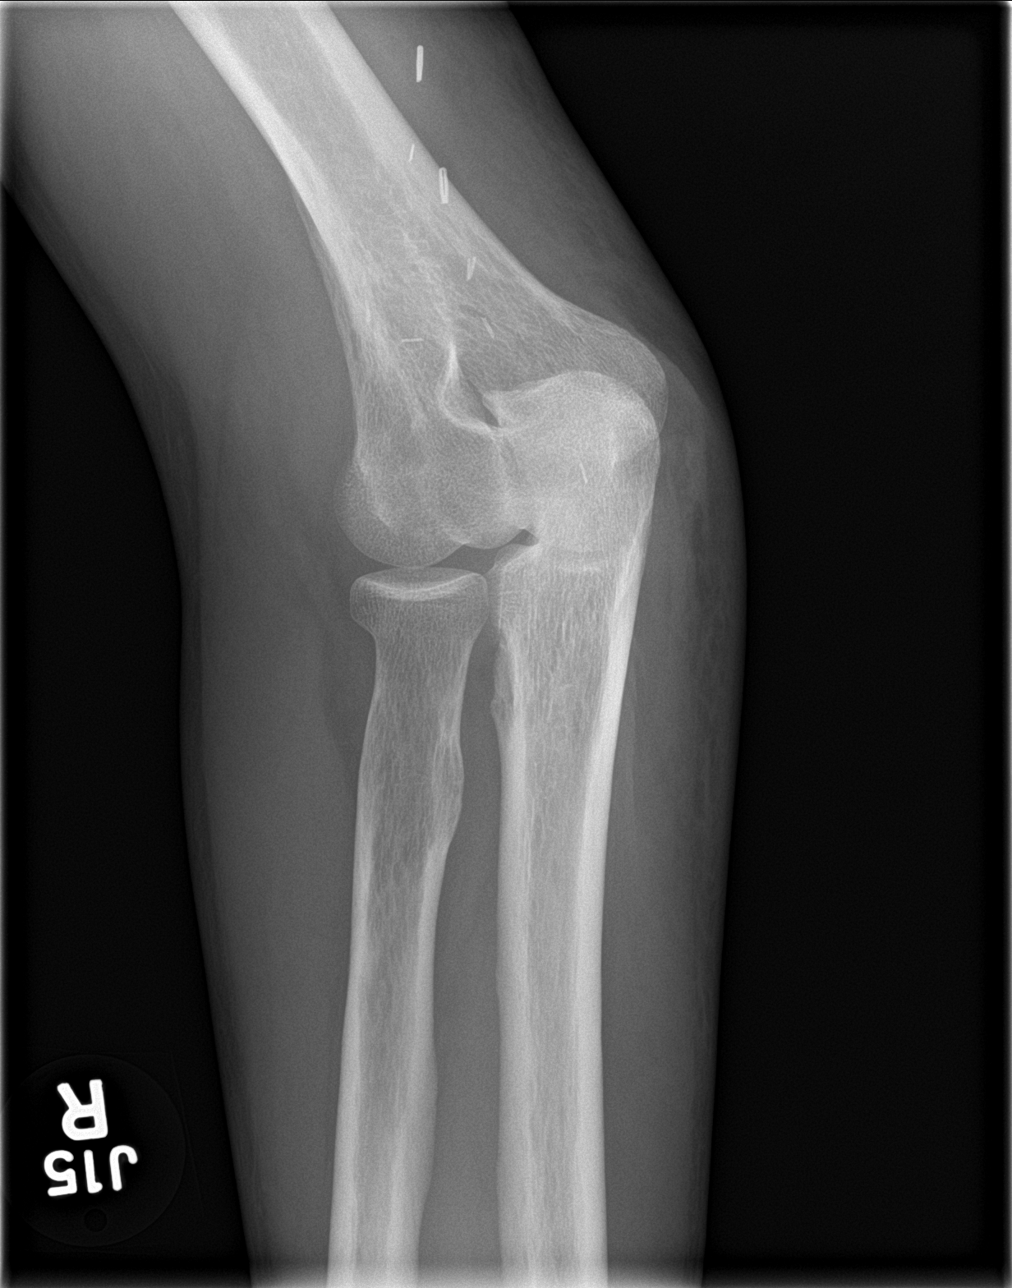

[elbow obl (2 of 2)]
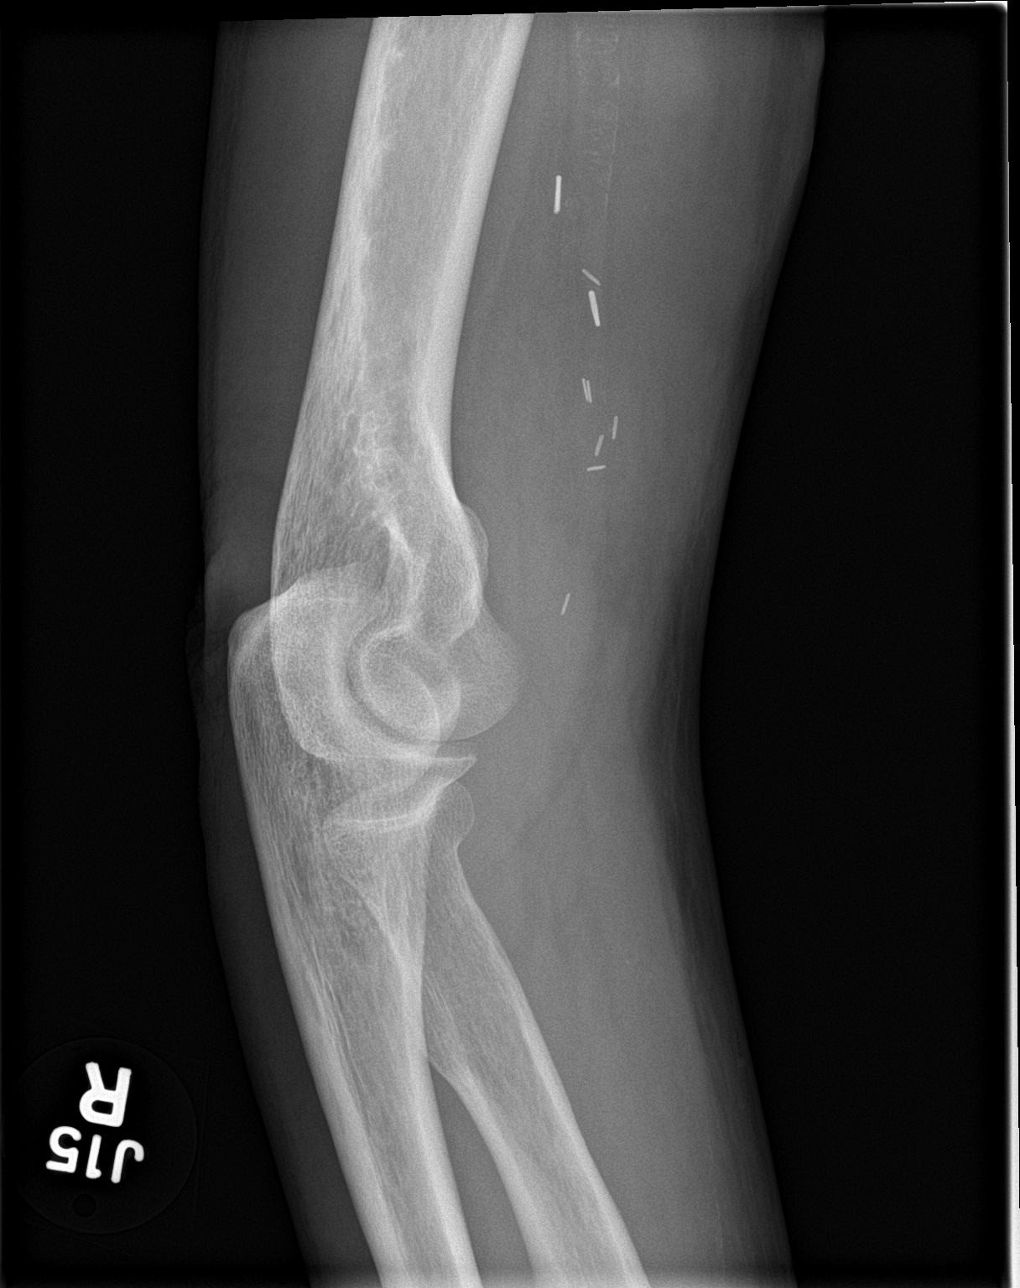

[elbow lat]
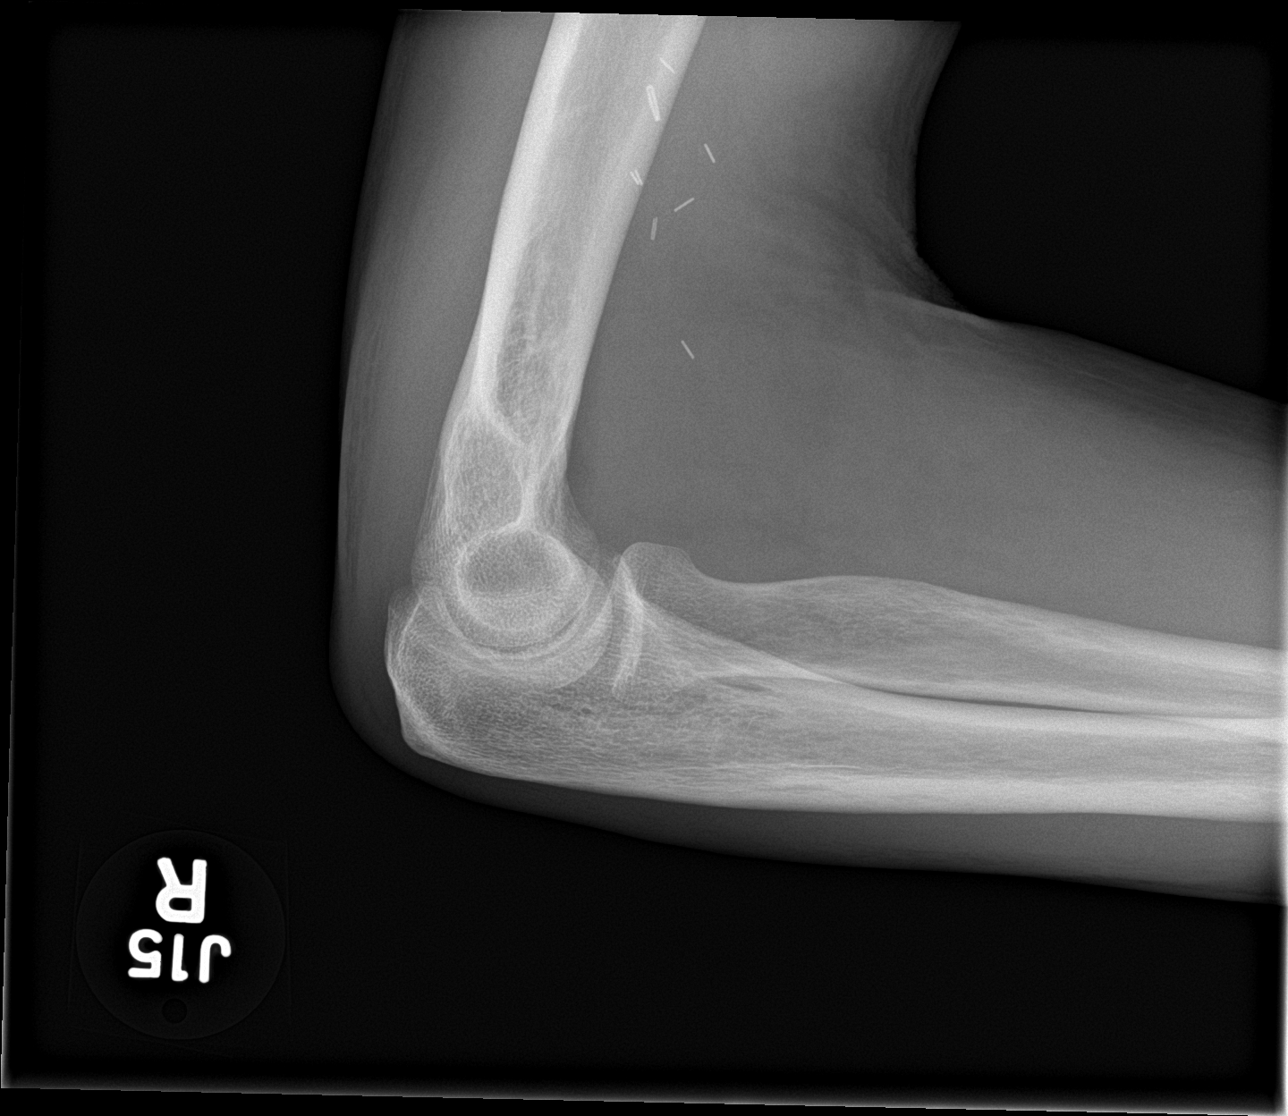

[4 of 4 positions shown; findings below may reference images not displayed]

FINDINGS: There is no evidence of fracture, dislocation, or joint effusion.
There is no evidence of arthropathy or other focal bone abnormality.
Post[REDACTED] seen within the anterior soft tissues.
IMPRESSION: No acute osseous abnormality identified.

## 2015-11-29 IMAGING — DX DG LUMBAR SPINE COMPLETE 4+V
5 series · 5 of 5 positions shown · non-contrast
Comparison: 10/05/2014 CT abdomen/pelvis.

CLINICAL DATA: Back pain after fall.

EXAM:
LUMBAR SPINE - COMPLETE 4+ VIEW

[l-spine ap]
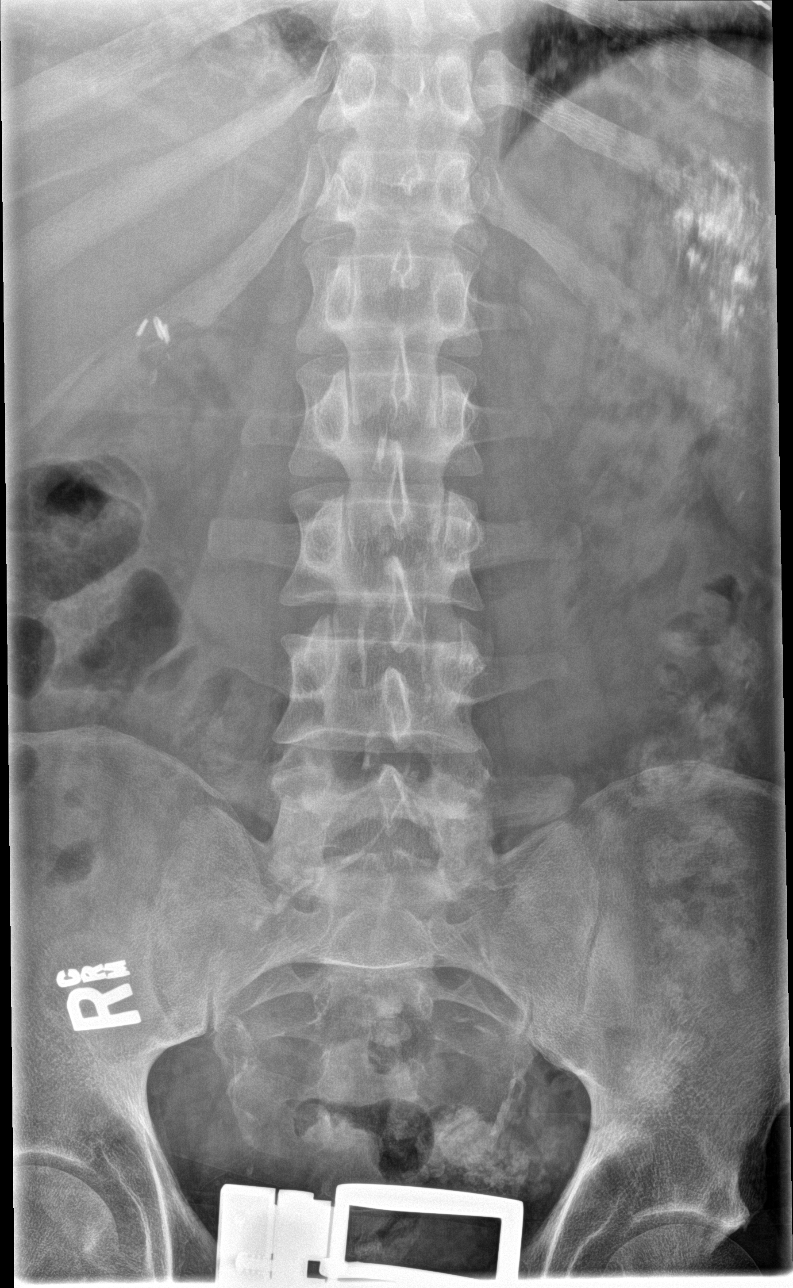

[l-spine obl (1 of 2)]
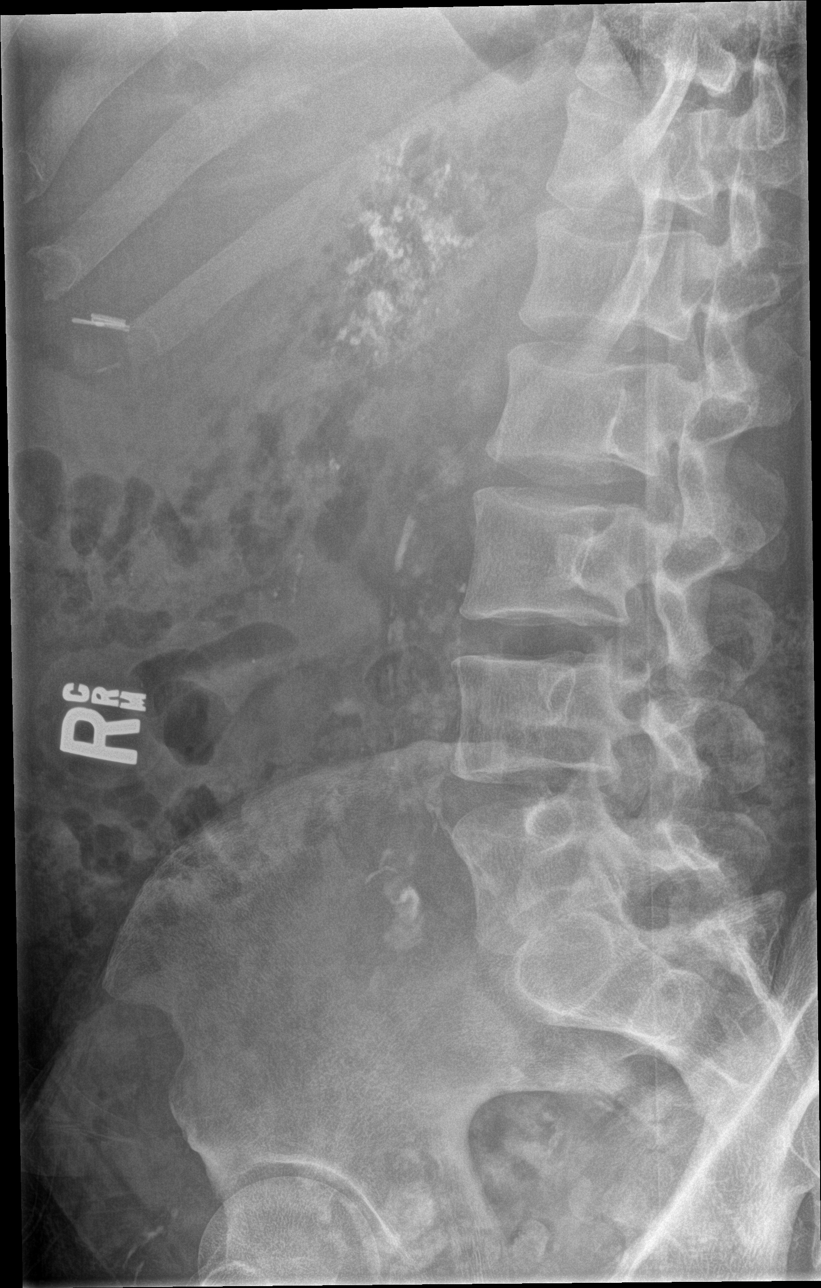

[l-spine obl (2 of 2)]
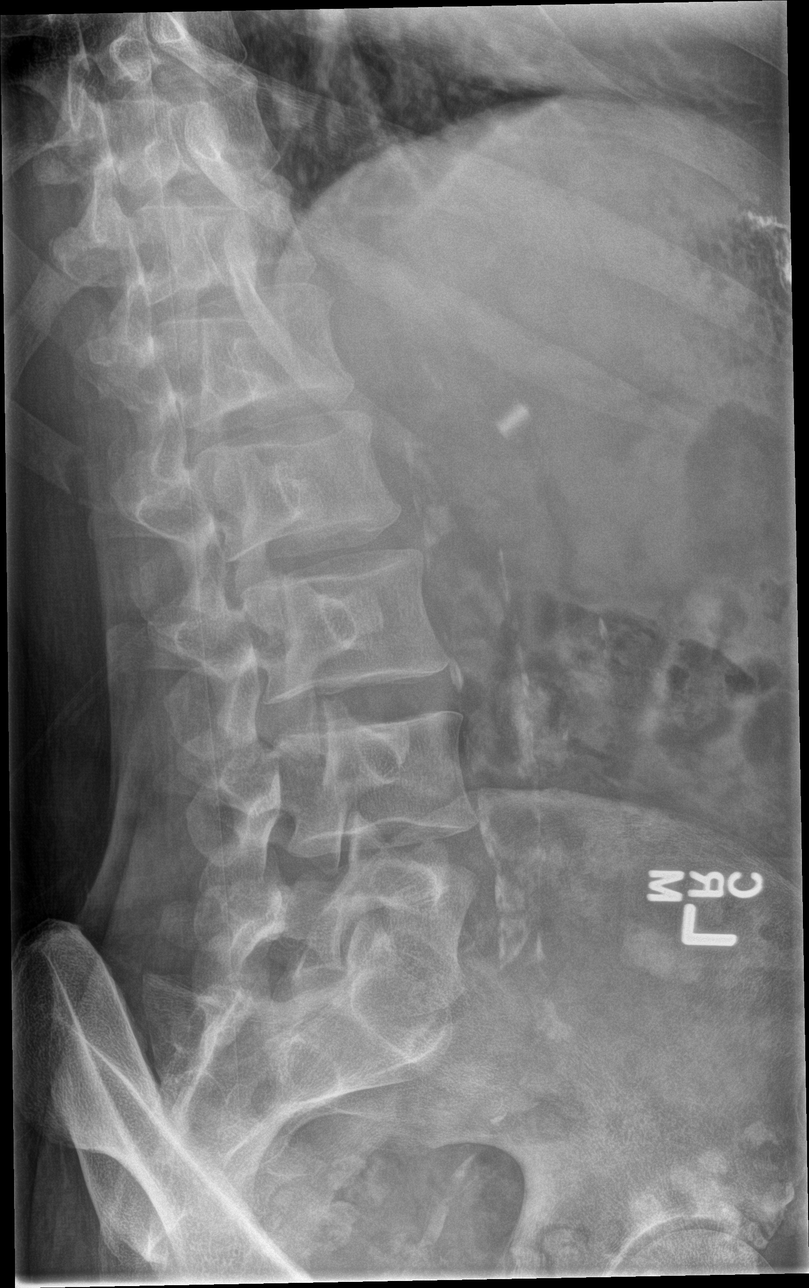

[l-spine lat]
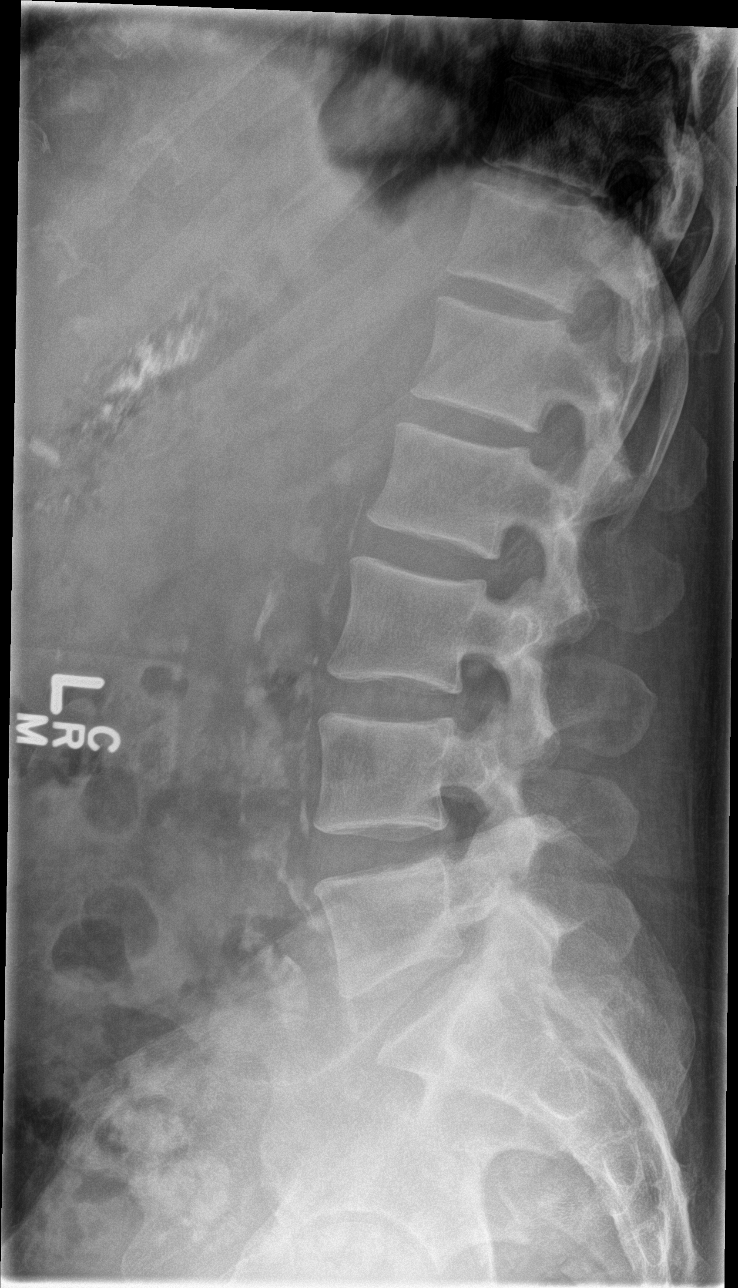

[l-spine spot]
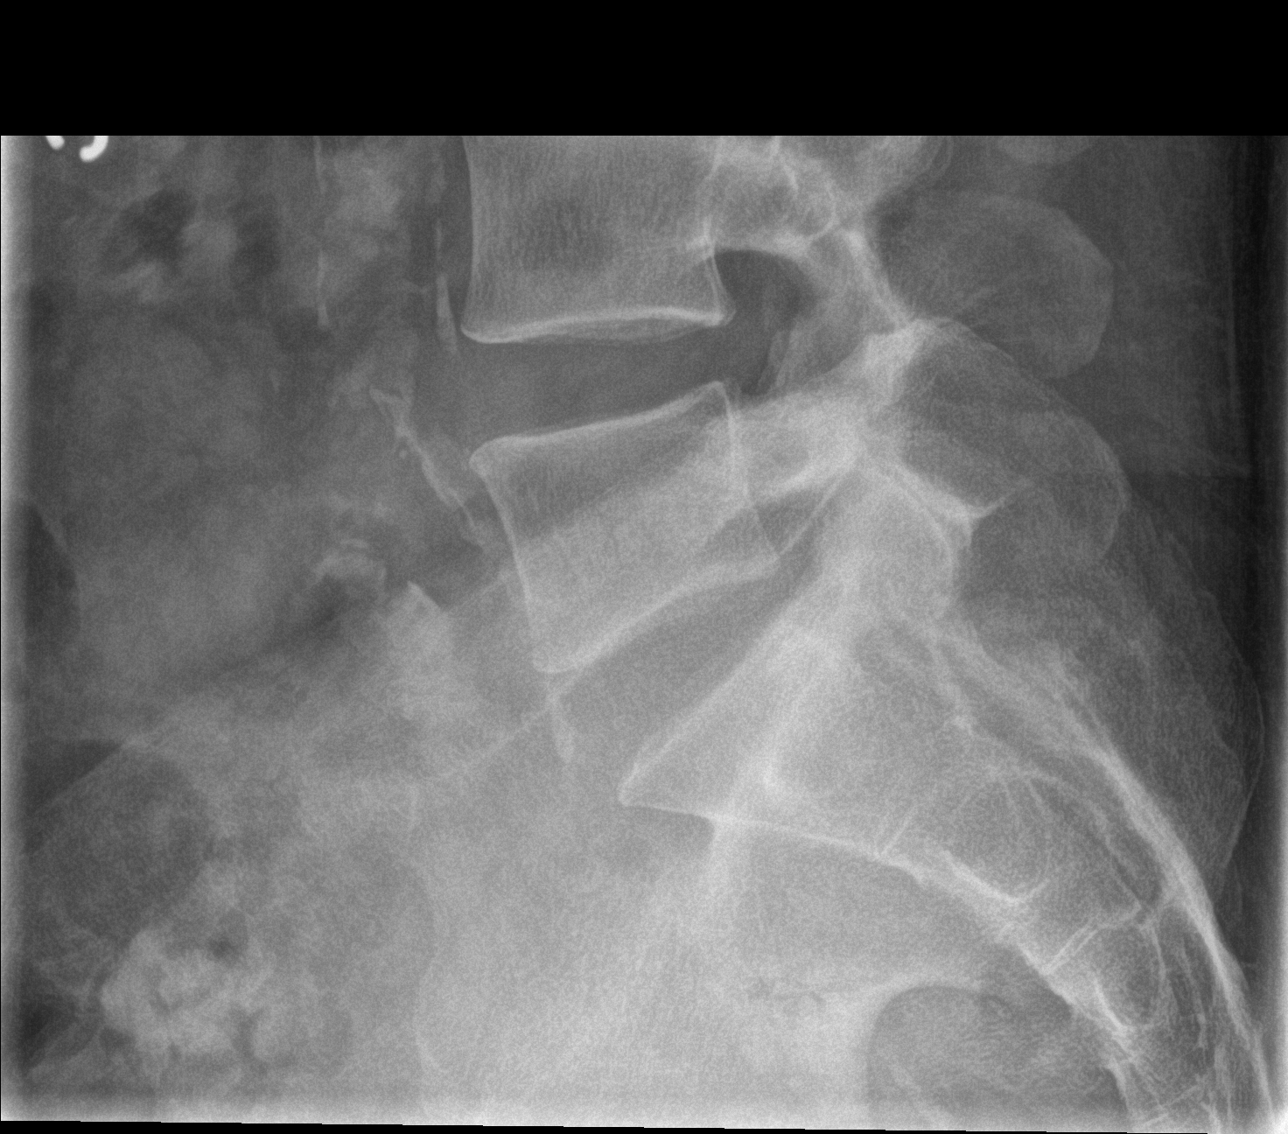

[5 of 5 positions shown; findings below may reference images not displayed]

FINDINGS: This report assumes 5 non rib-bearing lumbar vertebrae.

Lumbar vertebral body heights are preserved, with no fracture or
suspicious focal osseous lesion.

Lumbar disc heights are preserved. No spondylosis. No
spondylolisthesis. No appreciable facet arthropathy. No foraminal
stenosis. Cholecystectomy clips are seen in the right upper quadrant
of the abdomen. Vascular calcifications are noted throughout the
abdominal aorta and branch vessels. There is patchy radiodense
material within the splenic flexure of the colon associated with
mild to moderate colonic stool.
IMPRESSION: Negative.

## 2016-02-08 IMAGING — CR DG FOOT COMPLETE 3+V*L*
3 series · 3 of 3 positions shown · non-contrast
Comparison: Left ankle 04/12/2015

CLINICAL DATA: Lateral left foot pain since 04/25/2015. No known
injury. Cannot bear weight on the foot.

EXAM:
LEFT FOOT - COMPLETE 3+ VIEW

[x foot ap left]
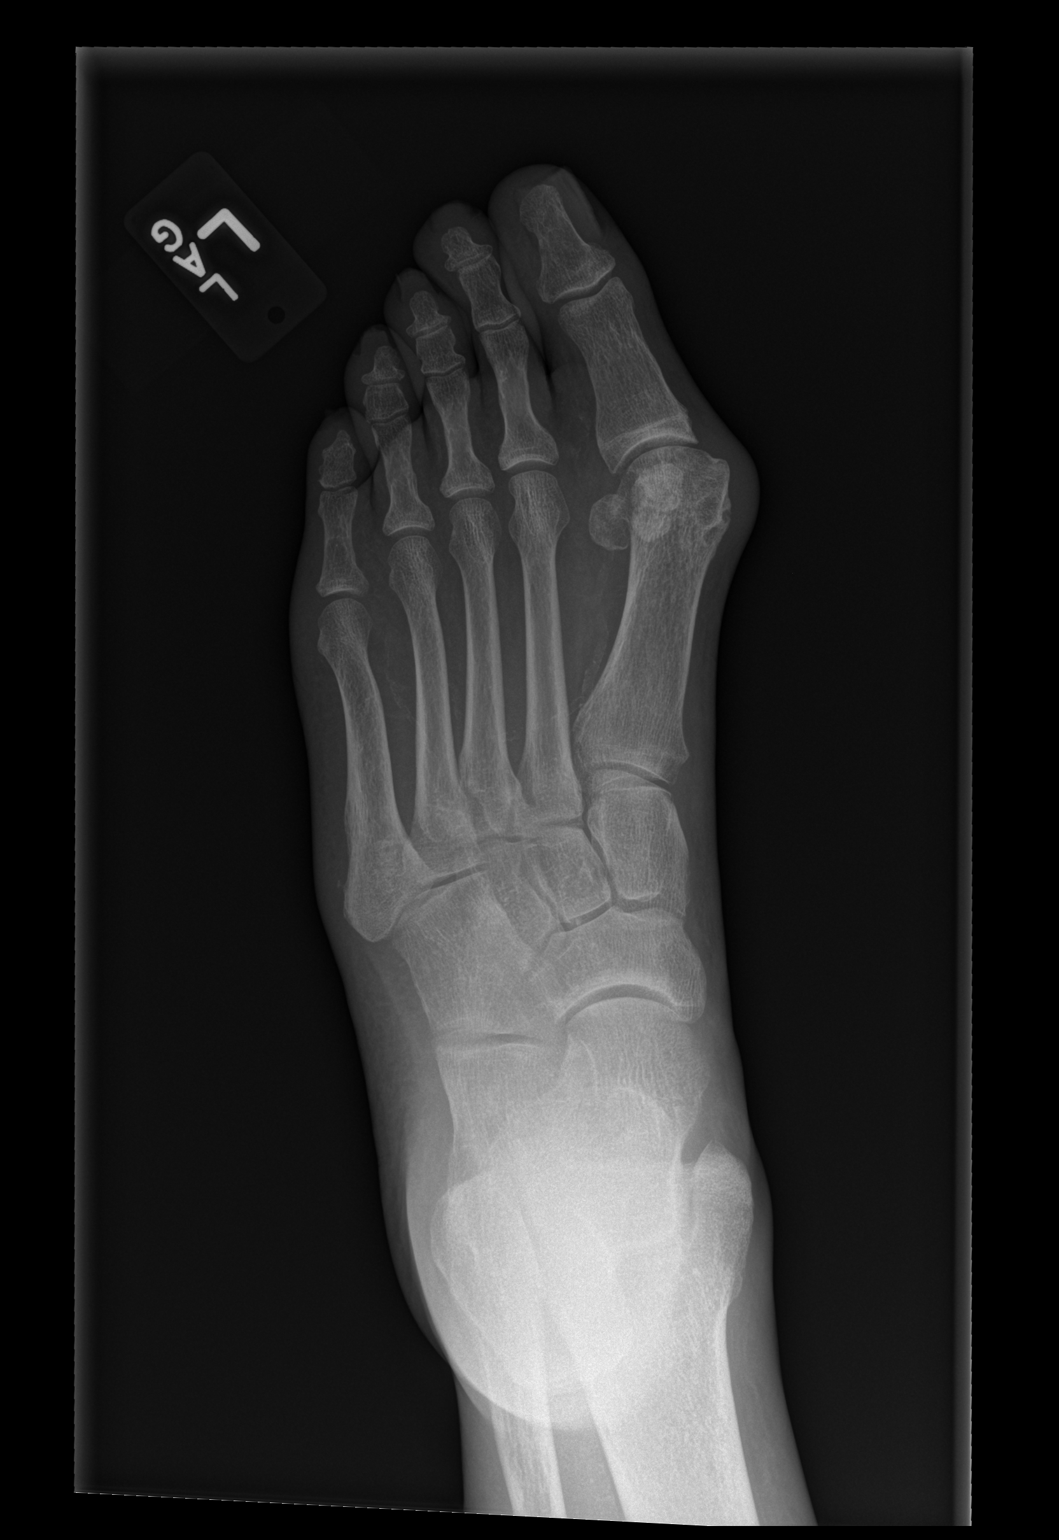

[x foot obl left]
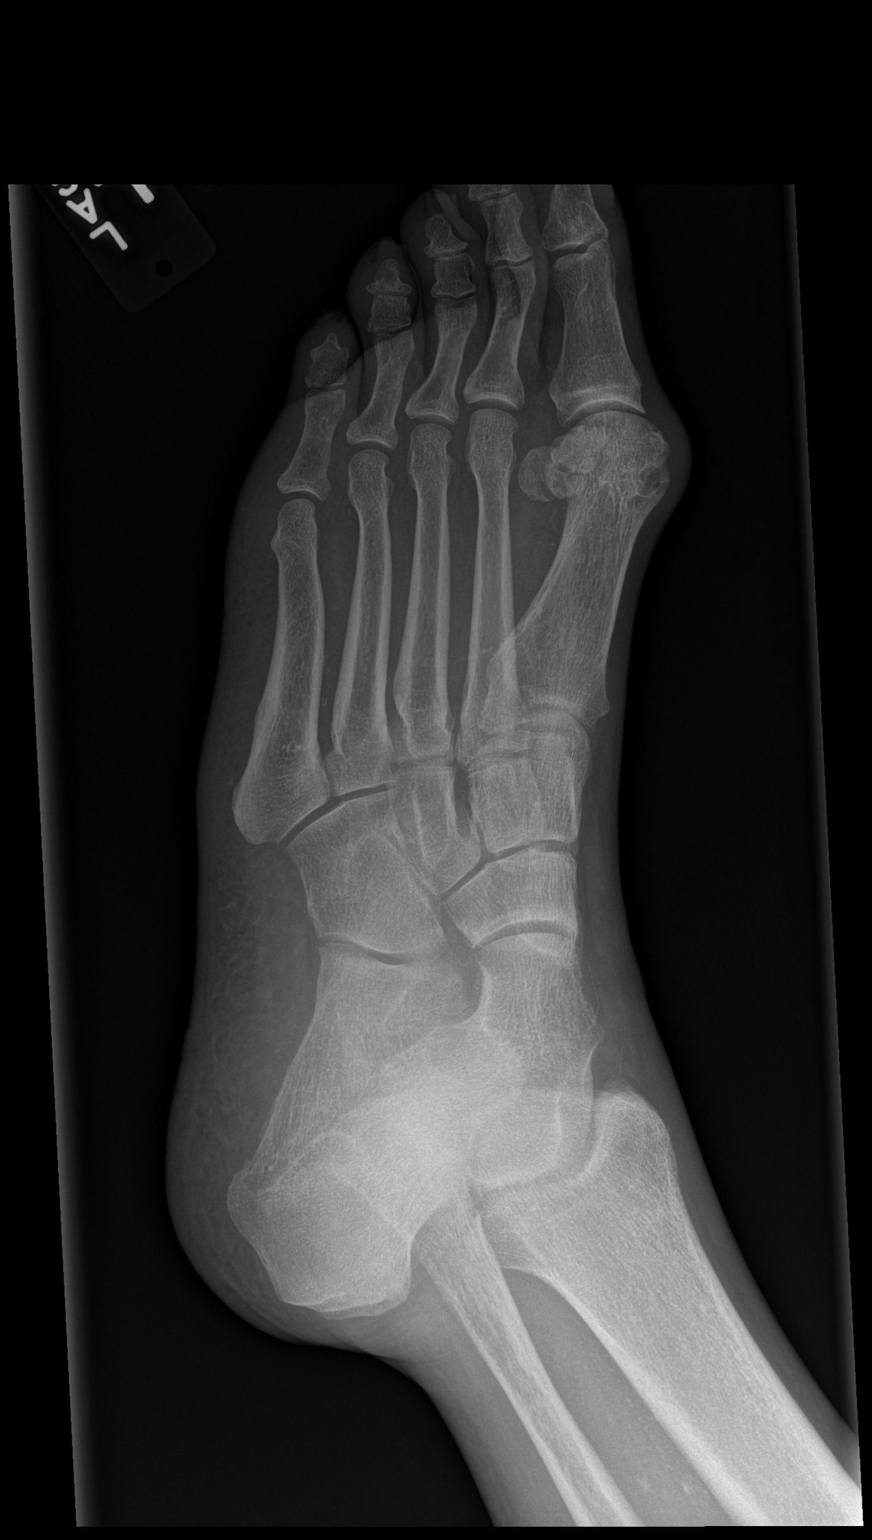

[x foot lat left]
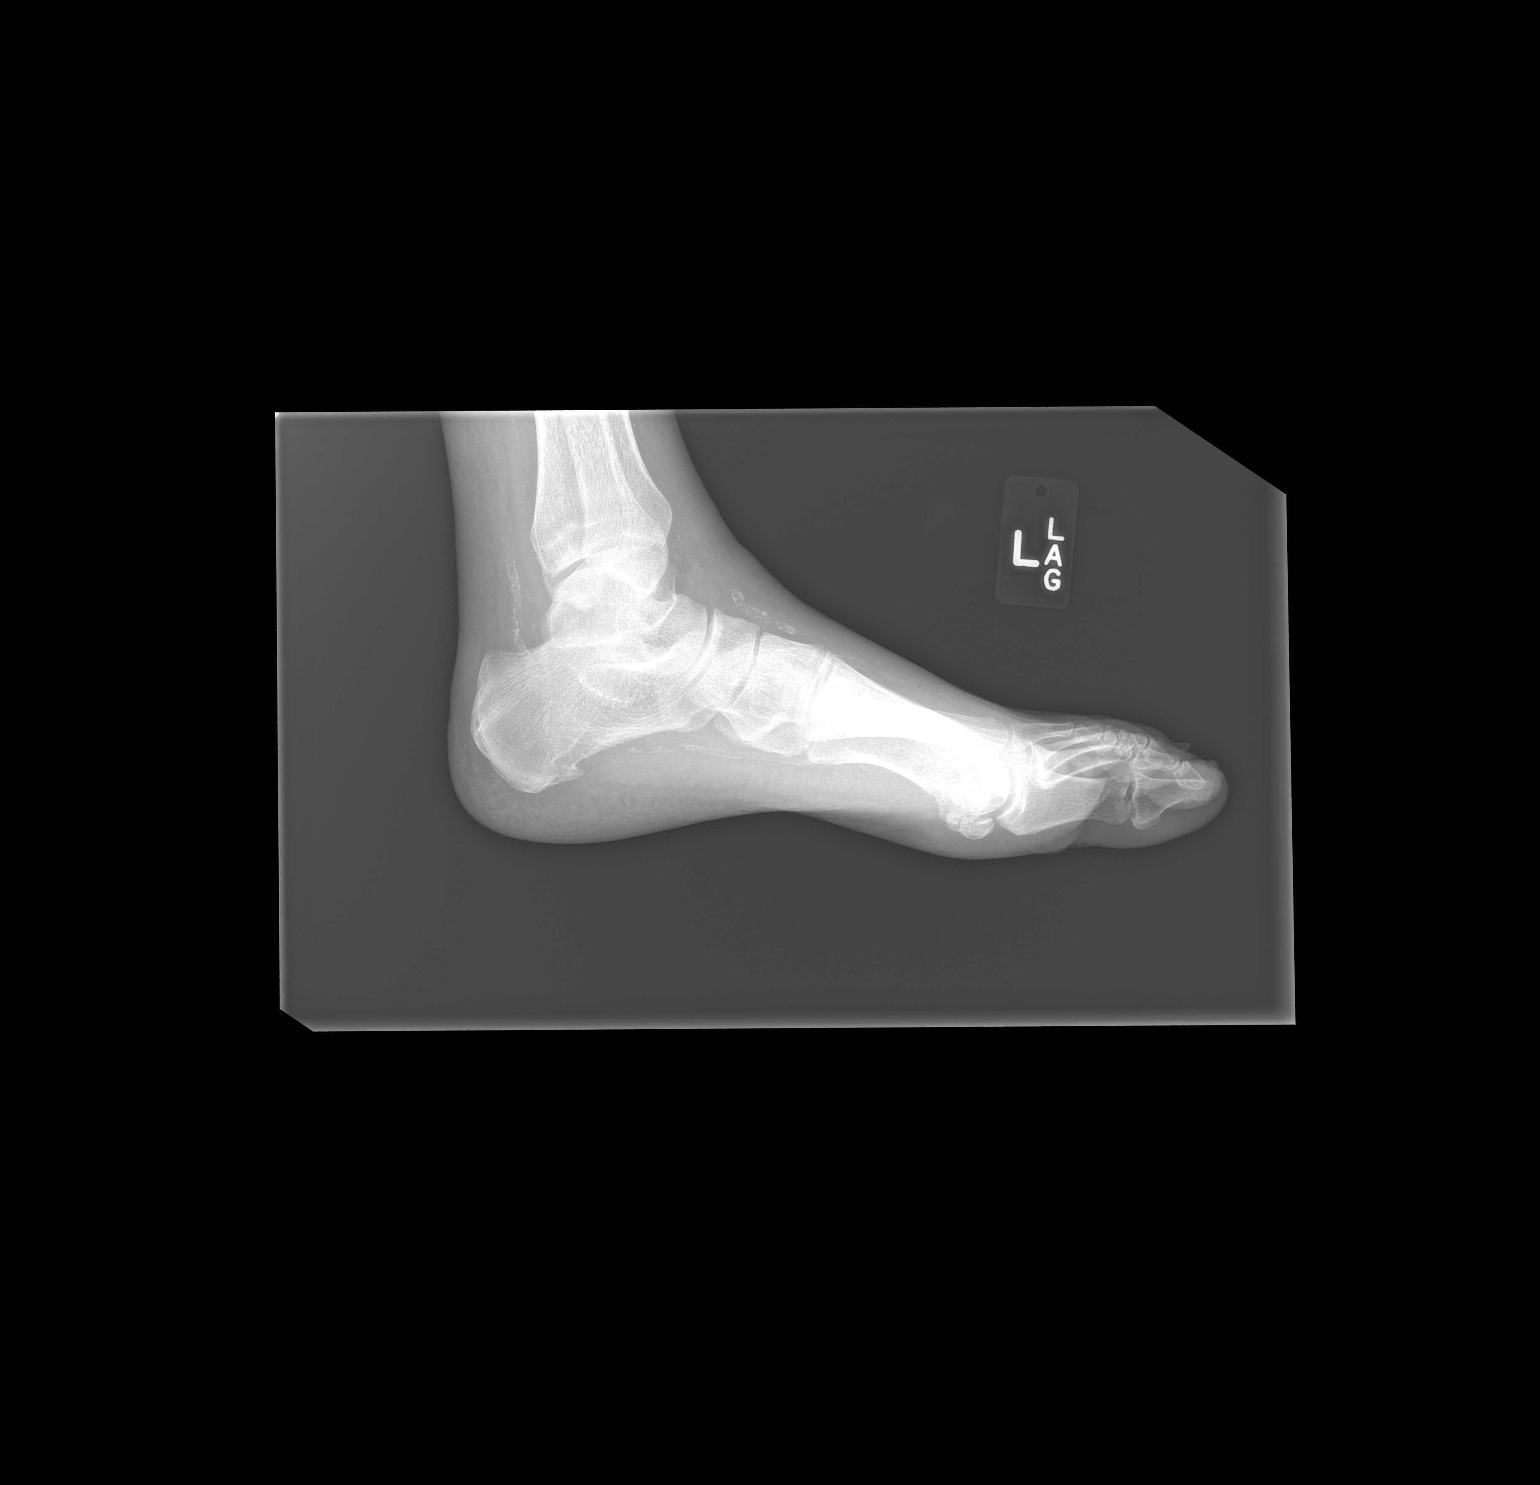

[3 of 3 positions shown; findings below may reference images not displayed]

FINDINGS: Left hallux valgus deformity with degenerative changes at the first
metatarsal-phalangeal joint. No evidence of acute fracture or
subluxation. No focal bone lesion or bone destruction. Bone cortex
and trabecular architecture appear intact. No radiopaque soft tissue
foreign bodies. Plantar calcaneal spur. Vascular calcifications.
IMPRESSION: Left hallux valgus deformity with associated degenerative changes.
No acute bony abnormalities.
